# Patient Record
Sex: Male | Born: 1945 | Race: White | Hispanic: No | Marital: Married | Smoking: Never smoker
Health system: Southern US, Community
[De-identification: ages and names within clinical notes are randomized; demographics above are authoritative.]

## PROBLEM LIST (undated history)

## (undated) DIAGNOSIS — G20A1 Parkinson's disease without dyskinesia, without mention of fluctuations: Secondary | ICD-10-CM

## (undated) DIAGNOSIS — N401 Enlarged prostate with lower urinary tract symptoms: Secondary | ICD-10-CM

## (undated) DIAGNOSIS — N138 Other obstructive and reflux uropathy: Secondary | ICD-10-CM

## (undated) DIAGNOSIS — G4733 Obstructive sleep apnea (adult) (pediatric): Secondary | ICD-10-CM

## (undated) DIAGNOSIS — K439 Ventral hernia without obstruction or gangrene: Secondary | ICD-10-CM

## (undated) DIAGNOSIS — I10 Essential (primary) hypertension: Secondary | ICD-10-CM

## (undated) DIAGNOSIS — E782 Mixed hyperlipidemia: Secondary | ICD-10-CM

## (undated) DIAGNOSIS — G20 Parkinson's disease: Secondary | ICD-10-CM

## (undated) DIAGNOSIS — J300 Vasomotor rhinitis: Secondary | ICD-10-CM

## (undated) DIAGNOSIS — F329 Major depressive disorder, single episode, unspecified: Secondary | ICD-10-CM

## (undated) DIAGNOSIS — G4731 Primary central sleep apnea: Secondary | ICD-10-CM

## (undated) DIAGNOSIS — I2510 Atherosclerotic heart disease of native coronary artery without angina pectoris: Secondary | ICD-10-CM

## (undated) DIAGNOSIS — E7800 Pure hypercholesterolemia, unspecified: Secondary | ICD-10-CM

## (undated) DIAGNOSIS — I219 Acute myocardial infarction, unspecified: Secondary | ICD-10-CM

## (undated) DIAGNOSIS — G4700 Insomnia, unspecified: Secondary | ICD-10-CM

## (undated) HISTORY — DX: Benign prostatic hyperplasia with lower urinary tract symptoms: N13.8

## (undated) HISTORY — DX: Atherosclerotic heart disease of native coronary artery without angina pectoris: I25.10

## (undated) HISTORY — DX: Parkinson's disease without dyskinesia, without mention of fluctuations: G20.A1

## (undated) HISTORY — PX: OTHER SURGICAL HISTORY: SHX169

## (undated) HISTORY — DX: Major depressive disorder, single episode, unspecified: F32.9

## (undated) HISTORY — DX: Acute myocardial infarction, unspecified: I21.9

## (undated) HISTORY — DX: Vasomotor rhinitis: J30.0

## (undated) HISTORY — DX: Insomnia, unspecified: G47.00

## (undated) HISTORY — DX: Parkinson's disease: G20

## (undated) HISTORY — PX: CATARACT EXTRACTION, BILATERAL: SHX1313

## (undated) HISTORY — DX: Ventral hernia without obstruction or gangrene: K43.9

## (undated) HISTORY — DX: Mixed hyperlipidemia: E78.2

## (undated) HISTORY — DX: Benign prostatic hyperplasia with lower urinary tract symptoms: N40.1

## (undated) HISTORY — PX: CARDIAC SURGERY: SHX584

## (undated) HISTORY — DX: Obstructive sleep apnea (adult) (pediatric): G47.33

## (undated) HISTORY — DX: Primary central sleep apnea: G47.31

## (undated) HISTORY — DX: Pure hypercholesterolemia, unspecified: E78.00

## (undated) HISTORY — DX: Essential (primary) hypertension: I10

---

## 2001-07-23 ENCOUNTER — Emergency Department (HOSPITAL_COMMUNITY): Admission: EM | Admit: 2001-07-23 | Discharge: 2001-07-23 | Payer: Self-pay | Admitting: Emergency Medicine

## 2002-03-20 ENCOUNTER — Encounter: Admission: RE | Admit: 2002-03-20 | Discharge: 2002-03-20 | Payer: Self-pay | Admitting: Orthopedic Surgery

## 2002-03-20 ENCOUNTER — Encounter: Payer: Self-pay | Admitting: Orthopedic Surgery

## 2002-04-17 ENCOUNTER — Emergency Department (HOSPITAL_COMMUNITY): Admission: EM | Admit: 2002-04-17 | Discharge: 2002-04-17 | Payer: Self-pay | Admitting: Emergency Medicine

## 2002-04-17 ENCOUNTER — Encounter: Payer: Self-pay | Admitting: Emergency Medicine

## 2002-04-24 ENCOUNTER — Encounter: Payer: Self-pay | Admitting: *Deleted

## 2002-04-24 ENCOUNTER — Encounter: Admission: RE | Admit: 2002-04-24 | Discharge: 2002-04-24 | Payer: Self-pay | Admitting: *Deleted

## 2002-04-26 ENCOUNTER — Ambulatory Visit (HOSPITAL_BASED_OUTPATIENT_CLINIC_OR_DEPARTMENT_OTHER): Admission: RE | Admit: 2002-04-26 | Discharge: 2002-04-26 | Payer: Self-pay | Admitting: *Deleted

## 2002-04-26 ENCOUNTER — Encounter: Payer: Self-pay | Admitting: *Deleted

## 2002-04-30 ENCOUNTER — Ambulatory Visit (HOSPITAL_COMMUNITY): Admission: RE | Admit: 2002-04-30 | Discharge: 2002-04-30 | Payer: Self-pay | Admitting: Urology

## 2002-05-02 ENCOUNTER — Observation Stay (HOSPITAL_COMMUNITY): Admission: EM | Admit: 2002-05-02 | Discharge: 2002-05-03 | Payer: Self-pay | Admitting: Urology

## 2002-05-07 ENCOUNTER — Encounter: Payer: Self-pay | Admitting: Emergency Medicine

## 2002-05-07 ENCOUNTER — Emergency Department (HOSPITAL_COMMUNITY): Admission: EM | Admit: 2002-05-07 | Discharge: 2002-05-07 | Payer: Self-pay | Admitting: Emergency Medicine

## 2002-05-09 ENCOUNTER — Ambulatory Visit (HOSPITAL_BASED_OUTPATIENT_CLINIC_OR_DEPARTMENT_OTHER): Admission: RE | Admit: 2002-05-09 | Discharge: 2002-05-09 | Payer: Self-pay | Admitting: Urology

## 2002-07-09 ENCOUNTER — Ambulatory Visit (HOSPITAL_BASED_OUTPATIENT_CLINIC_OR_DEPARTMENT_OTHER): Admission: RE | Admit: 2002-07-09 | Discharge: 2002-07-09 | Payer: Self-pay | Admitting: Urology

## 2003-02-02 HISTORY — PX: CORONARY ARTERY BYPASS GRAFT: SHX141

## 2003-02-11 ENCOUNTER — Inpatient Hospital Stay (HOSPITAL_COMMUNITY): Admission: EM | Admit: 2003-02-11 | Discharge: 2003-02-20 | Payer: Self-pay | Admitting: Emergency Medicine

## 2003-03-15 ENCOUNTER — Encounter (HOSPITAL_COMMUNITY): Admission: RE | Admit: 2003-03-15 | Discharge: 2003-06-13 | Payer: Self-pay | Admitting: Cardiology

## 2003-05-13 ENCOUNTER — Encounter: Admission: RE | Admit: 2003-05-13 | Discharge: 2003-08-11 | Payer: Self-pay | Admitting: Internal Medicine

## 2003-06-14 ENCOUNTER — Encounter (HOSPITAL_COMMUNITY): Admission: RE | Admit: 2003-06-14 | Discharge: 2003-07-22 | Payer: Self-pay | Admitting: Cardiology

## 2003-07-26 ENCOUNTER — Encounter: Admission: RE | Admit: 2003-07-26 | Discharge: 2003-07-26 | Payer: Self-pay | Admitting: Cardiothoracic Surgery

## 2003-09-24 ENCOUNTER — Encounter: Admission: RE | Admit: 2003-09-24 | Discharge: 2003-10-10 | Payer: Self-pay | Admitting: Internal Medicine

## 2003-10-10 ENCOUNTER — Emergency Department (HOSPITAL_COMMUNITY): Admission: EM | Admit: 2003-10-10 | Discharge: 2003-10-11 | Payer: Self-pay | Admitting: Emergency Medicine

## 2003-12-06 ENCOUNTER — Ambulatory Visit: Payer: Self-pay | Admitting: Cardiology

## 2004-01-01 ENCOUNTER — Ambulatory Visit: Payer: Self-pay | Admitting: Internal Medicine

## 2004-01-09 ENCOUNTER — Encounter
Admission: RE | Admit: 2004-01-09 | Discharge: 2004-04-08 | Payer: Self-pay | Admitting: Physical Medicine & Rehabilitation

## 2004-01-10 ENCOUNTER — Ambulatory Visit: Payer: Self-pay | Admitting: Physical Medicine & Rehabilitation

## 2004-01-14 ENCOUNTER — Encounter
Admission: RE | Admit: 2004-01-14 | Discharge: 2004-04-09 | Payer: Self-pay | Admitting: Physical Medicine & Rehabilitation

## 2004-04-10 ENCOUNTER — Encounter
Admission: RE | Admit: 2004-04-10 | Discharge: 2004-07-09 | Payer: Self-pay | Admitting: Physical Medicine & Rehabilitation

## 2004-05-04 ENCOUNTER — Ambulatory Visit: Payer: Self-pay | Admitting: Physical Medicine & Rehabilitation

## 2004-12-18 ENCOUNTER — Encounter
Admission: RE | Admit: 2004-12-18 | Discharge: 2005-03-18 | Payer: Self-pay | Admitting: Physical Medicine & Rehabilitation

## 2004-12-18 ENCOUNTER — Ambulatory Visit: Payer: Self-pay | Admitting: Physical Medicine & Rehabilitation

## 2004-12-22 ENCOUNTER — Encounter
Admission: RE | Admit: 2004-12-22 | Discharge: 2005-01-27 | Payer: Self-pay | Admitting: Physical Medicine & Rehabilitation

## 2005-03-02 ENCOUNTER — Encounter: Admission: RE | Admit: 2005-03-02 | Discharge: 2005-03-02 | Payer: Self-pay | Admitting: Cardiovascular Disease

## 2005-07-01 ENCOUNTER — Emergency Department (HOSPITAL_COMMUNITY): Admission: EM | Admit: 2005-07-01 | Discharge: 2005-07-01 | Payer: Self-pay | Admitting: Emergency Medicine

## 2007-02-28 ENCOUNTER — Emergency Department (HOSPITAL_COMMUNITY): Admission: EM | Admit: 2007-02-28 | Discharge: 2007-02-28 | Payer: Self-pay | Admitting: *Deleted

## 2007-09-23 ENCOUNTER — Observation Stay (HOSPITAL_COMMUNITY): Admission: AD | Admit: 2007-09-23 | Discharge: 2007-09-25 | Payer: Self-pay | Admitting: Cardiology

## 2007-09-23 ENCOUNTER — Encounter: Payer: Self-pay | Admitting: Emergency Medicine

## 2007-10-26 ENCOUNTER — Encounter: Admission: RE | Admit: 2007-10-26 | Discharge: 2007-10-26 | Payer: Self-pay | Admitting: Gastroenterology

## 2007-11-08 ENCOUNTER — Encounter: Admission: RE | Admit: 2007-11-08 | Discharge: 2007-11-08 | Payer: Self-pay | Admitting: Gastroenterology

## 2008-10-22 ENCOUNTER — Observation Stay (HOSPITAL_COMMUNITY): Admission: EM | Admit: 2008-10-22 | Discharge: 2008-10-23 | Payer: Self-pay | Admitting: Emergency Medicine

## 2008-12-23 ENCOUNTER — Emergency Department (HOSPITAL_BASED_OUTPATIENT_CLINIC_OR_DEPARTMENT_OTHER): Admission: EM | Admit: 2008-12-23 | Discharge: 2008-12-23 | Payer: Self-pay | Admitting: Emergency Medicine

## 2008-12-23 ENCOUNTER — Ambulatory Visit: Payer: Self-pay | Admitting: Diagnostic Radiology

## 2009-05-26 ENCOUNTER — Encounter: Admission: RE | Admit: 2009-05-26 | Discharge: 2009-05-26 | Payer: Self-pay | Admitting: Family Medicine

## 2009-06-03 ENCOUNTER — Emergency Department (HOSPITAL_COMMUNITY): Admission: EM | Admit: 2009-06-03 | Discharge: 2009-06-03 | Payer: Self-pay | Admitting: Emergency Medicine

## 2010-04-21 LAB — CBC
HCT: 37.9 % — ABNORMAL LOW (ref 39.0–52.0)
Hemoglobin: 13.2 g/dL (ref 13.0–17.0)
MCHC: 34.9 g/dL (ref 30.0–36.0)
MCV: 92.2 fL (ref 78.0–100.0)
Platelets: 229 10*3/uL (ref 150–400)
RBC: 4.12 MIL/uL — ABNORMAL LOW (ref 4.22–5.81)
RDW: 12.6 % (ref 11.5–15.5)
WBC: 6.8 10*3/uL (ref 4.0–10.5)

## 2010-04-21 LAB — POCT CARDIAC MARKERS
CKMB, poc: <1 ng/mL — ABNORMAL LOW (ref 1.0–8.0)
CKMB, poc: <1 ng/mL — ABNORMAL LOW (ref 1.0–8.0)
Myoglobin, poc: 62.2 ng/mL (ref 12–200)
Myoglobin, poc: 71.6 ng/mL (ref 12–200)
Troponin i, poc: <0.05 ng/mL (ref 0.00–0.09)
Troponin i, poc: <0.05 ng/mL (ref 0.00–0.09)

## 2010-04-21 LAB — D-DIMER, QUANTITATIVE: D-Dimer, Quant: 0.28 ug/mL-FEU (ref 0.00–0.48)

## 2010-04-21 LAB — BASIC METABOLIC PANEL
BUN: 20 mg/dL (ref 6–23)
CO2: 29 mEq/L (ref 19–32)
Calcium: 9 mg/dL (ref 8.4–10.5)
Chloride: 105 mEq/L (ref 96–112)
Creatinine, Ser: 0.92 mg/dL (ref 0.4–1.5)
GFR calc Af Amer: >60 mL/min (ref >60)
GFR calc non Af Amer: >60 mL/min (ref >60)
Glucose, Bld: 84 mg/dL (ref 70–99)
Potassium: 3.6 mEq/L (ref 3.5–5.1)
Sodium: 140 mEq/L (ref 135–145)

## 2010-04-21 LAB — DIFFERENTIAL
Basophils Absolute: 0 10*3/uL (ref 0.0–0.1)
Basophils Relative: 0 % (ref 0–1)
Eosinophils Absolute: 0.1 10*3/uL (ref 0.0–0.7)
Eosinophils Relative: 2 % (ref 0–5)
Lymphocytes Relative: 12 % (ref 12–46)
Lymphs Abs: 0.8 10*3/uL (ref 0.7–4.0)
Monocytes Absolute: 0.4 10*3/uL (ref 0.1–1.0)
Monocytes Relative: 5 % (ref 3–12)
Neutro Abs: 5.5 10*3/uL (ref 1.7–7.7)
Neutrophils Relative %: 81 % — ABNORMAL HIGH (ref 43–77)

## 2010-04-21 LAB — BRAIN NATRIURETIC PEPTIDE: Pro B Natriuretic peptide (BNP): 79 pg/mL (ref 0.0–100.0)

## 2010-05-06 LAB — BASIC METABOLIC PANEL
BUN: 30 mg/dL — ABNORMAL HIGH (ref 6–23)
CO2: 29 mEq/L (ref 19–32)
Calcium: 8.9 mg/dL (ref 8.4–10.5)
Chloride: 107 mEq/L (ref 96–112)
Creatinine, Ser: 1 mg/dL (ref 0.4–1.5)
GFR calc Af Amer: >60 mL/min (ref >60)
GFR calc non Af Amer: >60 mL/min (ref >60)
Glucose, Bld: 105 mg/dL — ABNORMAL HIGH (ref 70–99)
Potassium: 3.8 mEq/L (ref 3.5–5.1)
Sodium: 142 mEq/L (ref 135–145)

## 2010-05-06 LAB — URINALYSIS, ROUTINE W REFLEX MICROSCOPIC
Glucose, UA: NEGATIVE mg/dL
Ketones, ur: NEGATIVE mg/dL
pH: 6 (ref 5.0–8.0)

## 2010-05-06 LAB — CBC
HCT: 38.6 % — ABNORMAL LOW (ref 39.0–52.0)
Hemoglobin: 13.3 g/dL (ref 13.0–17.0)
MCHC: 34.5 g/dL (ref 30.0–36.0)
MCV: 93.2 fL (ref 78.0–100.0)
Platelets: 197 10*3/uL (ref 150–400)
RBC: 4.15 MIL/uL — ABNORMAL LOW (ref 4.22–5.81)
RDW: 12.5 % (ref 11.5–15.5)
WBC: 3.5 10*3/uL — ABNORMAL LOW (ref 4.0–10.5)

## 2010-05-06 LAB — DIFFERENTIAL
Basophils Absolute: 0 10*3/uL (ref 0.0–0.1)
Basophils Relative: 1 % (ref 0–1)
Eosinophils Absolute: 0.3 10*3/uL (ref 0.0–0.7)
Eosinophils Relative: 8 % — ABNORMAL HIGH (ref 0–5)
Lymphocytes Relative: 33 % (ref 12–46)
Lymphs Abs: 1.1 10*3/uL (ref 0.7–4.0)
Monocytes Absolute: 0.4 10*3/uL (ref 0.1–1.0)
Monocytes Relative: 10 % (ref 3–12)
Neutro Abs: 1.7 10*3/uL (ref 1.7–7.7)
Neutrophils Relative %: 48 % (ref 43–77)

## 2010-05-06 LAB — POCT CARDIAC MARKERS
CKMB, poc: <1 ng/mL — ABNORMAL LOW (ref 1.0–8.0)
CKMB, poc: <1 ng/mL — ABNORMAL LOW (ref 1.0–8.0)
Troponin i, poc: <0.05 ng/mL (ref 0.00–0.09)

## 2010-05-06 LAB — RAPID STREP SCREEN (MED CTR MEBANE ONLY): Streptococcus, Group A Screen (Direct): NEGATIVE

## 2010-05-08 LAB — POCT CARDIAC MARKERS
CKMB, poc: <1 ng/mL — ABNORMAL LOW (ref 1.0–8.0)
Myoglobin, poc: 43.7 ng/mL (ref 12–200)

## 2010-05-08 LAB — DIFFERENTIAL
Eosinophils Absolute: 0.1 10*3/uL (ref 0.0–0.7)
Eosinophils Relative: 3 % (ref 0–5)
Lymphocytes Relative: 28 % (ref 12–46)
Lymphs Abs: 0.9 10*3/uL (ref 0.7–4.0)
Lymphs Abs: 1.1 10*3/uL (ref 0.7–4.0)
Monocytes Absolute: 0.4 10*3/uL (ref 0.1–1.0)
Monocytes Absolute: 0.5 10*3/uL (ref 0.1–1.0)
Monocytes Relative: 12 % (ref 3–12)
Monocytes Relative: 8 % (ref 3–12)
Neutro Abs: 2.2 10*3/uL (ref 1.7–7.7)
Neutrophils Relative %: 71 % (ref 43–77)

## 2010-05-08 LAB — COMPREHENSIVE METABOLIC PANEL
AST: 15 U/L (ref 0–37)
BUN: 21 mg/dL (ref 6–23)
Calcium: 9.3 mg/dL (ref 8.4–10.5)
Creatinine, Ser: 0.91 mg/dL (ref 0.4–1.5)
GFR calc non Af Amer: >60 mL/min (ref >60)
Potassium: 3.8 mEq/L (ref 3.5–5.1)
Sodium: 142 mEq/L (ref 135–145)
Total Bilirubin: 0.7 mg/dL (ref 0.3–1.2)

## 2010-05-08 LAB — LIPID PANEL
HDL: 35 mg/dL — ABNORMAL LOW (ref >39)
Total CHOL/HDL Ratio: 2.9 RATIO
Triglycerides: 26 mg/dL (ref <150)
VLDL: 5 mg/dL (ref 0–40)

## 2010-05-08 LAB — CBC
HCT: 38.2 % — ABNORMAL LOW (ref 39.0–52.0)
HCT: 41.6 % (ref 39.0–52.0)
Hemoglobin: 12.8 g/dL — ABNORMAL LOW (ref 13.0–17.0)
MCHC: 33.9 g/dL (ref 30.0–36.0)
MCV: 92.1 fL (ref 78.0–100.0)
Platelets: 214 10*3/uL (ref 150–400)
Platelets: 214 10*3/uL (ref 150–400)
RBC: 4.08 MIL/uL — ABNORMAL LOW (ref 4.22–5.81)
RDW: 13 % (ref 11.5–15.5)
WBC: 4.9 10*3/uL (ref 4.0–10.5)

## 2010-05-08 LAB — LIPASE, BLOOD: Lipase: 25 U/L (ref 11–59)

## 2010-05-08 LAB — PROTIME-INR
INR: 0.9 (ref 0.00–1.49)
Prothrombin Time: 12.5 seconds (ref 11.6–15.2)

## 2010-05-08 LAB — CARDIAC PANEL(CRET KIN+CKTOT+MB+TROPI)
CK, MB: 0.5 ng/mL (ref 0.3–4.0)
Total CK: 76 U/L (ref 7–232)
Troponin I: 0.01 ng/mL (ref 0.00–0.06)

## 2010-05-08 LAB — HEMOGLOBIN A1C
Hgb A1c MFr Bld: 5.5 % (ref 4.6–6.1)
Mean Plasma Glucose: 111 mg/dL

## 2010-05-08 LAB — APTT: aPTT: 29 seconds (ref 24–37)

## 2010-05-08 LAB — CK TOTAL AND CKMB (NOT AT ARMC): Relative Index: INVALID (ref 0.0–2.5)

## 2010-05-08 LAB — TSH: TSH: 0.487 u[IU]/mL (ref 0.350–4.500)

## 2010-06-16 NOTE — Discharge Summary (Signed)
NAMEJAHMARI, Paul Ortiz              ACCOUNT NO.:  [id number]   MEDICAL RECORD NO.:  [id number]          PATIENT TYPE:  INP   LOCATION:  3740                         FACILITY:  [facility]   PHYSICIAN:  Antonieta Iba, MD   DATE OF BIRTH:  06/16/1945   DATE OF ADMISSION:  09/23/2007  DATE OF DISCHARGE:  09/25/2007                               DISCHARGE SUMMARY   Paul Ortiz is a 65 year old white married male patient with known  coronary artery disease.  He had an MI, a PCI, and then a CABG in 2005,  also has dyslipidemia.  He came to the emergency room because of sudden  onset of abdominal epigastric tightness and mild shortness of breath.  It occurred after he got upset with some visitors at his home.  He also  had palpitations.  Thus, he was seen in the emergency room and admitted  to rule out an MI.  To note, he recently had a stress Myoview in June  2009 that was negative for ischemia.  His medications were continued.  His Altace was thought to be 5 mg.  However, at the time of discharge it  was found he was only on 2.5 mg a day.  He only received one dose of  Altace at 5 mg and it was decided to give him back his home dose of  medication which is 2.5 because his blood pressure have been running  below 100 in the 96 range.  His CK-MBs and troponins came back negative.  He was covered with Lovenox.  He was seen by Dr. Julien Nordmann on  September 25, 2007, considered stable for discharge home.  He will follow  up with Dr. Tresa Endo in 1-2 weeks.   DISCHARGE DIAGNOSES:  1. Chest pain, tachy palpitations after some emotional stress.  CK-MB      and troponins came back negative.  He has had a recent stress      Myoview on June 2009 which was negative for ischemia.  Chest pain      thought not to be ischemic related.  2. History of coronary artery disease with a coronary artery bypass      graft in 2005 with a left internal mammary artery to his left      anterior descending, radial artery  graft to his circumflex, and      saphenous vein graft to his posterior descending artery.  3. Dyslipidemia.  4. Lactose intolerance.  5. Gastroesophageal reflux disease.   LABORATORY DATA:  CK-MBs are negative x3.  Sodium 143, potassium 4.0,  chloride 105, CO2 30, BUN 18, and creatinine 1.06.  Hemoglobin 13.7,  hematocrit 40.8, WBCs 4.5, and platelets 197,000.  TSH is 1.146 and  magnesium is 2.3.   DISCHARGE MEDICATIONS:  Altace 2.5 mg a day, Nexium 40 mg b.i.d., Toprol-  XL 25 mg at bedtime, Plavix 75 mg at bedtime, Crestor same dose as he  was at home at bedtime, aspirin 81 mg every day, niacin 2000 mg at  bedtime, Xanax XR 1 mg at bedtime, and fish oil 5000 mg a day.   He  will follow up Dr. Nicki Guadalajara on October 05, 2007, at 12:15.   He has no activity restrictions.      Lezlie Octave, N.P.      Antonieta Iba, MD  Electronically Signed    BB/MEDQ  D:  09/25/2007  T:  09/26/2007  Job:  [id number]   cc:   Dr. Tiburcio Pea

## 2010-06-19 NOTE — Op Note (Signed)
NAME:  Paul Ortiz, Paul Ortiz                        ACCOUNT NO.:  [id number]   MEDICAL RECORD NO.:  [id number]                   PATIENT TYPE:  AMB   LOCATION:  NESC                                 FACILITY:  [facility]   PHYSICIAN:  Bertram Millard. Dahlstedt, M.D.          DATE OF BIRTH:  06-29-1945   DATE OF PROCEDURE:  05/09/2002  DATE OF DISCHARGE:                                 OPERATIVE REPORT   PREOPERATIVE DIAGNOSES:  1. Left hydronephrosis.  2. Left renal calculus.   POSTOPERATIVE DIAGNOSES:  1. Left hydronephrosis.  2. Left renal calculus.  3. Small perforation in ureter.   PRINCIPAL PROCEDURE:  1. Cystoscopy.  2. Left retrograde ureteropyelogram.  3. Double-J stent placement (26 cm x 6 [place]).   ANESTHESIA:  General with LMA.   COMPLICATIONS:  None.   BRIEF HISTORY:  A 65 year old male with significant problems over the past 2-  3 weeks with left ureteral and now left renal calculus.  He underwent  lithotripsy of an 8 mm left upper ureteral calculus with insignificant  fragmentation.  The patient was then sent to me by [name], M.D. for  definitive treatment.  I performed a left ureteroscopy last week.  I was  unable to encounter the stone, despite going up into the kidney and  inspecting each calix.  It may be that the small stone was resting in a  calix that I could not get to with the flexible scope.  A stent was placed  although on follow-up in the office, there was an unusual curl to the stent.  I then pulled the stent out last week.  The patient initially had resolution  of his pain.  However, he has had persistent pain and some psoas signs since  that time.  I saw him yesterday and recommended that we place another stent,  as he may have persistent hydronephrosis.  He does have a history of a left  distal ureteral stricture which I dilated last week.   He presents at this time for the procedure of placing a stent.  He is aware  of risks and complications and  desires to proceed.   DESCRIPTION OF PROCEDURE:  The patient was administered a general  anesthetic.  He was also given preoperative IV antibiotics.  He was placed  in the dorsal lithotomy position.  Genitalia and perineum were prepped and  draped.  Cystoscopy was performed.  Bladder was normal except for mild  erythema around the left ureteral orifice.  I did not see duplicated  orifices on either side.  A retrograde was then performed.  This showed a  small stricture in the distal ureter, but this easily allowed contrast to  get past.  There was a small obvious perforation in the proximal ureter.  There was mild left hydronephrosis.  I then advanced a guidewire through the  end-hole catheter and up into the left renal pelvis and upper pole.  I then  placed a 26 cm, 6 French double-J stent over the guidewire.  Good curls were  seen proximally and distally fluoroscopically and cystoscopically after  placement.  The bladder was drained.  The procedure was then terminated.  The patient tolerated the procedure well.  He was returned to the PACU in  stable condition after extubation.   I will follow the patient up in approximately one week.  He will be covered  with p.o. antibiotics.  He was given pain medicines yesterday.  He was also  given a prescription for Oxybutynin p.r.n. frequent urination.                                               Bertram Millard. Dahlstedt, M.D.    SMD/MEDQ  D:  05/09/2002  T:  05/09/2002  Job:  [id number]

## 2010-06-19 NOTE — Assessment & Plan Note (Signed)
DATE OF VISIT:  May 04, 2004   REASON FOR VISIT:  Mr. Olazabal returns today with his pain averaging about  3/10.  He sleeps okay.  His relief from medication is about 80%.  He is  using a 1/4 Lidoderm patch over a very specific small area at the mid  sternal incision area on the left side.  He has completed his physical  therapy.  He states that this has generally helped him.  This has been upper  back strengthening with stretching of the pectoralis musculature.  They did  get rid of some of his trigger points in that area.  He is not taking any  oral pain medications for his chest pain and is just using Lidoderm patches.  He was wondering whether he could wear it a little bit longer than 12 hours.   SOCIAL HISTORY:  Married and works 40 hours a week.   PHYSICAL EXAMINATION:  VITAL SIGNS:  Blood pressure 125/64, pulse 75,  respirations 16, O2 saturations 99% on room air.  GENERAL:  In no acute distress.  Mood and affect appropriate.  Gait is  normal.  He has mild tenderness to palpation with right mid sternal incision  approximately 2 cm lateral to midline.  He has no tenderness around the  ribs.  He has no tenderness around the thoracic paraspinals.  He has good  spine range of motion.   IMPRESSION:  Post sternotomy pain.  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  This has responded nicely to the Lidoderm.  Part of it is just  lack of contact with clothing which is what really irritates that area, but  I think the other is the local anesthetic of the Lidoderm on superficial  nerves.   PLAN:  1.  Will continue the Lidoderm.  He can wear it up to 18 hours given that it      is only a 1/4 patch.  2.  Continue physical therapy exercises.  3.  I do not think I need to see him back.  His primary physician should be      okay with prescribing the Lidoderm.  4.  Will write him another prescription and see him back p.r.n.      AEK/MedQ  D:  05/04/2004 12:50:50  T:  05/04/2004  14:40:55  Job #:  [id number]   cc:   Sharlet Salina, M.D.  [address]  [place]  [place] [address]  Fax: [phone]   Mikey Bussing, M.D.  [address]  [place]  [place] [address]

## 2010-06-19 NOTE — Op Note (Signed)
NAME:  Paul Ortiz, Paul Ortiz                        ACCOUNT NO.:  [id number]   MEDICAL RECORD NO.:  [id number]                   PATIENT TYPE:  AMB   LOCATION:  NESC                                 FACILITY:  [facility]   PHYSICIAN:  Bertram Millard. Dahlstedt, M.D.          DATE OF BIRTH:  01/18/1946   DATE OF PROCEDURE:  07/09/2002  DATE OF DISCHARGE:                                 OPERATIVE REPORT   PREOPERATIVE DIAGNOSIS:  A 5 mm left lower calyceal stone.   POSTOPERATIVE DIAGNOSIS:  A 5 mm left lower calyceal stone.   PRINCIPAL PROCEDURE:  Left ureteroscopy with holmium laser lithotripsy of  left renal stone, double J stent placement, interpretive fluoroscopy.   SURGEON:  Bertram Millard. Dahlstedt, M.D.   ANESTHESIA:  General with LMA.   COMPLICATIONS:  None.   BRIEF HISTORY:  A 65 year old male with a difficult-to-treat left renal  stone.  He has been through previous ureteroscopy.  He had a ureteral  stricture, which made it difficult to get to the stone before.  He has had  an indwelling stent now for several weeks and returns at this point for  definitive ureteroscopy, laser lithotripsy of stone.  He is aware of risks  and complications and desires to proceed.   DESCRIPTION OF PROCEDURE:  The patient was administered IV gentamicin prior  to the procedure and taken to the operating room, where a general anesthetic  was administered using the LMA.  He was placed in the dorsal lithotomy  position.  Genitalia and perineum were prepped and draped.  A 22 French  panendoscope was passed into his bladder, which was normal.  The previously-  mentioned ureteral stent was removed.  I then placed a ureteral access  sheath and through the access sheath passed the 7.5 French flexible  ureteroscope.  I was easily able to guide this ureteroscope into the lower  pole of the kidney.  The stone was encountered.  The 200 micron laser fiber  was then passed through the scope.  Once the stone was  encountered, it was  fragmented into quite a few small fragments, which were all felt to be small  enough to pass quite easily.  No other stones were seen in the kidney.  At  this point the scope was removed.  Over top of the guidewire, a 24 cm x 6  [place] double J stent was placed.  A string was left on the distal end.  The  bladder was drained and the scope removed.   The patient tolerated the procedure well.  He was awakened and taken to the  PACU in stable condition.                                                Bertram Millard. Dahlstedt, M.D.  SMD/MEDQ  D:  07/09/2002  T:  07/09/2002  Job:  [id number]

## 2010-06-19 NOTE — Op Note (Signed)
NAME:  Paul Ortiz, Paul Ortiz                        ACCOUNT NO.:  [id number]   MEDICAL RECORD NO.:  [id number]                   PATIENT TYPE:  AMB   LOCATION:  DAY                                  FACILITY:  [facility]   PHYSICIAN:  Bertram Millard. Dahlstedt, M.D.          DATE OF BIRTH:  [birth year]   DATE OF PROCEDURE:  DATE OF DISCHARGE:                                 OPERATIVE REPORT   PREOPERATIVE DIAGNOSIS:  Left ureteral calculus with hydronephrosis.   POSTOPERATIVE DIAGNOSIS:  No ureteral calculus seen, left ureteral  stricture.   PRINCIPAL PROCEDURE:  1. Cystoscopy.  2. Left retrograde.  3. Ureteropyelogram.  4. Left ureteroscopy (rigid and flexible).  5. Dilation of left ureteral stricture.  6. Double-J stent placement.  7. Interpretive fluoroscopy.   SURGEON:  Bertram Millard. Dahlstedt, M.D.   ANESTHESIA:  General endotracheal.   COMPLICATIONS:  None.   BRIEF HISTORY:  Paul Ortiz, who presented recently to Paul Ortiz, M.D. with Paul left upper ureteral stone and hydronephrosis.  He has  had significant pain.  Lithotripsy was performed, and follow-up revealed no  fragmentation or movement of the stone.  The patient has had persistent pain  and desires further management.  The patient was given treatment options and  at this point, it is recommended he undergo left ureteroscopy with direct  holmium laser lithotripsy of the stone.  He is aware of the risks and  complications involved and desires to proceed.   DESCRIPTION OF PROCEDURE:  IV gentamycin and vancomycin were administered  preoperatively.  The patient was taken to the operating room where general  endotracheal anesthetic was administered.  He was placed in the dorsal  lithotomy position.  Genitalia and perineum were prepped and draped.  Paul  cystoscope was placed into his bladder.  Urethral and prostatic urethra were  unremarkable. Bladder was unremarkable.  Single ureteral orifices were seen  on either side.   The left ureteral orifice was cannulated, and Paul retrograde  pyelogram was performed.  This showed no evident hydronephrosis.  I really  did not see any filling defects in the left ureter.  Paul guidewire was placed  in the left renal pelvis, and ureteroscope was passed over top of the  guidewire.  I was unable to get past the distal ureteral stricture.  I then  used Paul 10 cm 15 French balloon to dilate.  Despite 20 atmospheres of  pressure, there was still Paul waist at this area.  The balloon was removed,  and I was then able to traverse this small stricture.  I did not see any  duplicated ureters.  The ureter was traversed all the way to the left renal  pelvis.  I did not see any stones.  I did not see any hydronephrosis.  Once  I got the scope up into the left renal pelvis, it was evident that I would  have to use Paul flexible ureteroscope  to get into the calices to try to  identify any stone.  I then used Paul 55 cm ureteral access sheath over top of  the guidewire to then allow access with the 6 French flexible ureteroscope  into the left renal pelvis.  I then identified all calices.  I was unable to  see any stone in any of his calices.  I did place contrast through the  ureteroscope to better identify the calices fluoroscopically.  Each calix  was entered.  Again, I did not see any stones.  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  I did not see any additional ureteral  orifices either at the bladder neck or on the trigone.  Careful inspection  was performed once the ureteroscope was removed.  On removal of the  ureteroscope, again I did not see any stones, and I did not see anything  that looked Paul Y segment of ureter.  I then placed double-J stent over Paul  guidewire.  There was an unusual coil of the stent in the proximal ureter  and renal pelvis.  I was unable to get this straightened out despite  multiple attempts at passing the scope.  I then removed the guidewire.  At   this point, the bladder was drained.  The scope was removed and the  procedure terminated.  The patient was administered Paul B&O suppository.  He  was awakened and taken to PACU in stable condition.                                               Bertram Millard. Dahlstedt, M.D.    SMD/MEDQ  D:  04/30/2002  T:  04/30/2002  Job:  [id number]

## 2010-06-19 NOTE — H&P (Signed)
NAME:  Paul Ortiz, Paul Ortiz                        ACCOUNT NO.:  [id number]   MEDICAL RECORD NO.:  [id number]                   PATIENT TYPE:  AMB   LOCATION:  NESC                                 [facility]ACILITY[facility]   PHYSICIAN:  Ethelene Browns., M.D.            DATE OF BIRTH:  [birth year]   DATE OF ADMISSION:  DATE OF DISCHARGE:                                HISTORY & PHYSICAL   CHIEF COMPLAINT:  This patient, age 65, presented on 24 April 2002 with a  chief complaint of pain in his left side, onset 17 April 2002.  He was seen  in the emergency room at [facility].  A CT scan revealed an 8 mm calculus  in the lower calix of the left kidney and a 1 mm calculus in the distal  right ureter which he subsequently passed on May 16, 2002.   HISTORY OF PRESENT ILLNESS:  The patient's first stone was roughly in 1975.  Prior to his present episode, his last stone was 1987.  He did not recover  any of these stones.  The patient had pain in the left side on 17 April 2002, went to [facility] Emergency Room where the CT scan revealed a 1 mm  stone in the distal left ureter and an 8 mm stone in the left lower calix,  really not much in the way of dilation.  He also was found to have some low  density liver lesions, probably cysts, but they will be investigated by Dr.  Lovell Sheehan further.  He did reasonably well until 24 April 2002, when the pain  in the left side recurred and was quite severe.  He came to the office,  received Toradol 60 mg IM with fairly good relief of his pain.  He gets up 0-  1 times a night, voids every 4 hours during the day, had a fairly good flow,  sometimes a little slow starting.  He reports that he had a normal PSA by  Dr. Lovell Sheehan, but it has been a little over a year ago.  He has not passed  any gross blood in the urine.  He has not had any fever.  He is unable to  give [place] a urine today.  The ultrasound suggests that he has about four  ounces in his bladder.  There is a  grade 1 hydronephrosis on the left.  There is no hydronephrosis on the right.  A KUB is performed and suggests  that the 8 mm calculus has now moved into the upper ureter and is at the L2-  3 level.  The patient is recommended for shock wave lithotripsy and is  scheduled 26 April 2002 at 3 p.m., [facility] under [facility] and is  given Jacky Kindle to take for pain.   ALLERGIES:  PENICILLIN, and he does not feel well when he takes CODEINE.   MEDICATIONS:  1.  Xanax.  2. Lactaid.  3. Ginger.   PAST HISTORY:  1. Lactose intolerance.  2. Mitral prolapse.   SURGERIES:  None.   FAMILY HISTORY:  Mother died age 28 of pneumonia.  Father died of myocardial  infarction age 98.  He has one sibling with a lymphoma, a male sibling.  He has two children.  The family history is positive for diabetes in a  daughter.  No hypertension, no bleeders.  There is heart disease and cancer.   SOCIAL HISTORY:  He is married.  He does not use tobacco or alcohol.  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   REVIEW OF SYSTEMS:  GENERAL:  His weight has been steady.  HEENT:  Unremarkable.  He does have glasses.  CARDIORESPIRATORY:  Has mitral  prolapse but no chest pain or heart attack or asthma.  GI:  He has irritable  bowel syndrome, lactose intolerance, frequent BM's, but no blood in his  stools.  BONES/JOINTS/MUSCLES:  Unremarkable.  NEUROPSYCHIATRIC:  Unremarkable.  SKIN:  No lesions.  LYMPHATIC:  No nodes noted.   PHYSICAL EXAMINATION:  GENERAL:  Well-developed, well-nourished, 65 year old  male.  VITAL SIGNS:  Temperature 98, pulse 72, respirations 20, blood pressure  153/87, weight 180.  HEENT:  The ears and tympanic membranes are unremarkable.  Eyes react normal  to light and accommodation.  Extraocular movements intact.  Pharynx benign.  His teeth are good.  NECK:  No enlargement of thyroid.  No nodes palpable.  CHEST:  Clear to percussion and auscultations.  No murmur noted.   Normal  sinus rhythm.  ABDOMEN:  Flat.  Liver, kidney, spleen, hernia not detected.  There is  rather marked left flank tenderness.  GENITALIA:  He is uncircumcised.  The meatus is normal.  The testes are  normal.  There is a sensitive right spermatocele about 2 cm in diameter.  Scrotal, anus, perineum normal.  Rectal tone good.  Prostate soft,  symmetrical, smooth, 20-25 grams.  EXTREMITIES:  No edema.  Good peripheral pulses.  NEUROLOGIC:  Reflexes and sensation seem normal.  SKIN:  No lesions.  LYMPHATIC:  No nodes noted.   IMPRESSION:  1. A left ureteral stone 8 mm L2-3.  2. Left hydronephrosis.  3. Benign prostatic hypertrophy.   PLAN:  1. Give him [employer] for control of pain.  2. He is to force fluids.  3. Strain urine.  4. We will schedule extracorporeal shock wave lithotripsy 1500 hours, 26 April 2002, at [facility].                                               Ethelene Browns., M.D.    HB/MEDQ  D:  04/24/2002  T:  04/24/2002  Job:  [id number]   cc:   Dr. Erroll [facility]

## 2010-06-19 NOTE — Assessment & Plan Note (Signed)
CONSULT REQUESTED BY:  Dr. Kathlee Nations Trigt at [address] [place],  [place] [address].   This is a 65 year old male who saw me for initial consultation on January 10, 2004.  He has chest wall pain related to sternal incision scar  hypersensitivity as well as myofascial pain in the left pectoralis.  He went  to physical therapy, they did some deep myofascial massage in the pectoralis  and while this was initially painful, he is now pain free in the pectoralis  area and is able to take a deep breath.  The therapist has also strengthened  upper back musculature, looked at his posture and tried to get him sitting  up straighter at the computer.  He states that this has improved.  The  Lidoderm patch has helped.  He was wearing it at night because he told me  that he had problems with bed linens on the left side of his chest wall, but  he is sleeping well with it now.  He states his problem is more during the  daytime and sometimes when he has pressure over the left side of the sternal  border, he has some radiating pain towards his left axilla.   He has had no new medical problems in the interval time.  He does notice  some soreness around the left side of the xyphoid process.   SOCIAL HISTORY:  Married; works full time.   PAIN SCORE:  A 4/10 currently; was 5/10 last time.   PHYSICAL EXAMINATION:  VITAL SIGNS:  Blood pressure 116/57; pulse 87;  respiratory rate 18; O2 sat 98-99% room air.  CHEST:  No tenderness to palpation over the left pectoralis.  He does have  some tenderness along the left side of the sternal incision near the  midpoint or more corresponding to T4.  He also has some tenderness around  the left side of the xyphoid process.  He has no T10 rib pain.   IMPRESSION:  Chest wall pain, scar supersensitivity as well as myofascial  pain.   PLAN:  1.  We will have physical therapy work on the left rectus abdominal origin      at the xyphoid area.  I believe he does have a  trigger point there.  2.  I have told him to take the Lidoderm patch cut in half lengthwise and      place it on the left side of the sternal incision during the day; take      it off at night.  3.  No other medications.  He discussed wanting to be off other medicines,      most notably Xanax, and I concurred with him on that.  He states that he      mainly has problems sleeping at night and thinks that this medicine is      not helping him.  He is following up with his primary care physician      regarding this.  4.  I will see him back in approximately 1 month to follow-up on his therapy      progress.      AEK/MedQ  D:  02/24/2004 16:27:25  T:  02/24/2004 17:35:00  Job #:  [id number]

## 2010-06-19 NOTE — Assessment & Plan Note (Signed)
INTERVAL HISTORY:  [age]-year-old male returns to follow up on left-  sided myofascial chest pain, has a history of sternotomy, has history of  neuroma, sternal incision scar hypersensitivity as well as muscle, i.e.,  myofascial pain.  He has had improvements now that he is back in physical  therapy.  He is back using his Lidoderm on a regular basis.  He is not  taking any oral analgesics at this time.  No shortness of breath.  No GI  discomfort.   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   REVIEW OF SYSTEMS:  Positive for anxiety and dizziness.   EXAMINATION:  VITAL SIGNS:  Blood pressure 106/57, pulse 68, respiratory  rate 16, O2 saturation 99% room air.  GENERAL:  No acute distress.  Mood and affect appropriate.  Orientation x3.  He has tenderness at the infraclavicular area over the upper aspect of the  left pectoralis muscle overlying rib.  He has intercostal tenderness at T3-4  level left parasternal.  He has full range of motion upper extremities.  His  posture is forward flexed.  He has no pain with resisted arm extension  bilaterally.  He has hypertrophic scar in lower aspect of his sternotomy  incision that is only mildly tender to palpation.   IMPRESSION:  1.  Post-sternotomy pain with scar hypersensitivity.  2.  Myofascial pain left pectoralis major and likely minor.  3.  Left costochondritis.   PLAN:  1.  Continue Lidoderm.  2.  Continue physical therapy desensitization.  3.  I will see him back in approximately 94-month followup.      Erick Colace, M.D.  Electronically Signed     AEK/MedQ  D:  01/15/2005 12:49:22  T:  01/16/2005 08:42:54  Job #:  [id number]   cc:   Kerin Perna, M.D.  [address]  [place]  [place] [address]   Sharlet Salina, M.D.  Fax: [phone]

## 2010-06-19 NOTE — Assessment & Plan Note (Signed)
MEDICAL RECORD NUMBER:  [id number]   DATE OF BIRTH:  [birth year]   Paul Ortiz returns today, has some recurrence of pain, midline lower  sternum as well as left costochondral junction and left pectoralis area.  Pain is 5/10, averaging 7/10. He was doing quite well and, in fact, I had  him come back on a p.r.n. basis. However, he stopped using his Lidoderm on a  regular basis. He is not taking any oral pain medications. He denies any  shortness of breath, sweats, or chills; no GI discomfort. He has some  depression/anxiety.   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   EXAMINATION:  Blood pressure 109/58, pulse 68, respirations 16, O2  saturation 99% in room air.   In general, no acute distress, mood and affect appropriate. He has  tenderness along the left pectoralis lateral aspect at the level of the  distal third of the clavicle infraclavicular, he has pain at the third  intercostal space left paramedian, and then he has two areas where he has  some hypertrophic scarring in the lower aspect of the incision that are also  tender to palpation.   His gait is normal. He has no respiratory distress.   IMPRESSION:  1.  Post sternotomy pain, neuroma two locations. Has been responding to      Lidoderm and needs to resume.  2.  Myofascial pain left pectoral region.  3.  Left costochondritis.   PLAN:  1.  Continue Lidoderm.  2.  Will resume physical therapy desensitization.  3.  Also discussed scar infiltration with lidocaine and pectoral trigger      point injection. He is leery of the trigger point because of risk of      pneumothorax which I described to him.   I will see him back in 1 month.      Erick Colace, M.D.  Electronically Signed     AEK/MedQ  D:  12/21/2004 13:50:07  T:  12/21/2004 16:25:32  Job #:  [id number]   cc:   Sharlet Salina, M.D.  Fax: [phone]   Kerin Perna,  M.D.  [address]  [place]  [place] [address]

## 2010-06-19 NOTE — Group Therapy Note (Signed)
REQUESTING PHYSICIAN:  Kerin Perna, M.D.   REASON FOR CONSULTATION:  Persistent left sided chest pain, status post  CABG.   HISTORY:  The patient is a 65 year old male who awoke, on February 11, 2003,  in the middle of the night sweating and dizzy.  He went to the ER, further  workup included cardiac catheterization revealing three-vessel disease, and  he underwent three-vessel coronary artery bypass grafting with internal  mammary artery saphenous as well as radial artery grafts.  This was done on  February 14, 2003.  The patient postoperatively had severe diarrhea which  prolonged hospital stay by one day.  He had followup with cardiology, Dr.  Samule Ohm, continued on his statin, continued aspirin, Plavix, and encouraged  fluid intake.  He saw Dr. Donata Clay in followup on several occasions.  On  August 23, 2003, the patient was noted to have chest wall hypersensitivity,  tried Ultram.  There was reference to, late June 2005, hypersensitivity of  the sternal incision which was felt to be fairly severe.  His Ultram trial  did not particularly help his pain.  It made him feel like a zombie.  He was  advised not to do any heavy lifting.  He was seen again, on December 10, 2003, and this time referred to the pain clinic.  He notes that his pain is  relieved by Tylenol during the day, but at night he has hypersensitivity  even with bed linens over the left side of his chest.  His pain rating goes  from 1-7 out of 10 and averages 5 out of 10.  His pain improves with rest as  well as Tylenol, made worse with working.  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   SOCIAL HISTORY:  Married.  Works full time as noted above.   REVIEW OF SYSTEMS:  He notes frequent PACs, abnormal heart rhythm, anxiety,  diarrhea, constipation, easy bleeding and bruising.   PHYSICAL EXAMINATION:  VITAL SIGNS:  Blood pressure 102/59, pulse 70,  respirations 18, O2 sat 100% on room air.  AFFECT:  Anxious but  otherwise no agitation.  GAIT:  Normal.  NECK:  No pain with range of motion but some tenderness in the lower  cervical area and of note is that he saw orthopedics today for that problem.  EXTREMITIES:  His upper extremity strength is 5/5 in the deltoid, biceps,  triceps, grip.  CHEST:  He has tenderness with even light sensation approximately 1-cm  lateral to the mid portion of his sternal incision.  There is no skin  breakdown in that area.  He has no induration.  The scar itself is mildly  hypertrophic in certain areas but not overly sensitive.  There is some  tightness in the scar and he particularly feels this when he extends his  upper back.  CARDIAC:  He has occasional ectopic beats.  No murmurs.  No rubs.  LUNGS:  Clear.  ABDOMEN:  Positive bowel sounds, soft.  MUSCULOSKELETAL:  Examination of his pectoralis muscle reveals multiple  tender areas, particularly over the ribs and at least one trigger area where  he had some radiation of pain down his arm with pressure over his pectoralis  muscle.   IMPRESSION:  1.  Chest wall pain, related to sternal incision scar hypersensitivity.  I      explained this as being an abnormal healing process of the nerves and      nerves being sensitized  to normally non-painful stimuli.  2.  Myofascial pain, left pectoralis.  I explained this as a secondary      effect, given his pain tends to have some involuntary spasms left      pectoralis.   RECOMMENDATIONS:  1.  We will apply Lidoderm patch over the super sensitive scar area.  I      think he will only need one half of a patch given the relatively small      area.  2.  We will send him for physical therapy for scar desensitization,      mobilization, upper back strengthening, pectoralis stretching, and      avoidance of pectoralis strengthening type exercises.  3.  I will see him back in about a month.  We will see how he does with his      therapy and the Lidoderm.  If he still have  suboptimal pain levels, I      would consider trigger point injection.  I cautioned him with potential      pneumothorax, although there is a slight chance he understands this is      the reason why we do not proceed with this immediately.  4.  I also advised him of using Lidoderm patch only 12 hours a day, stating      that 24 hours a day could result in measurable serum blood levels of      lidocaine and the potential arrhythmia risk.     Andr   AEK/MedQ  D:  01/10/2004 17:09:18  T:  01/10/2004 18:34:59  Job #:  [id number]   cc:   Mikey Bussing, M.D.  [address]  [place]  [place] [address]

## 2010-06-19 NOTE — H&P (Signed)
NAME:  Paul Ortiz, Paul Ortiz                        ACCOUNT NO.:  [id number]   MEDICAL RECORD NO.:  [id number]                   PATIENT TYPE:  AMB   LOCATION:  DAY                                  FACILITY:  [facility]   PHYSICIAN:  Bertram Millard. Dahlstedt, M.D.          DATE OF BIRTH:  1945/06/17   DATE OF ADMISSION:  05/02/2002  DATE OF DISCHARGE:                                HISTORY & PHYSICAL   REASON FOR ADMISSION:  Left flank pain, fever.   HISTORY OF PRESENT ILLNESS:  The patient is a 65 year old male with a left-  sided ureteral/renal calculus.  This was treated with lithotripsy first on  04/25/02, by Dr. Retia Passe.  Treatment went well, but postoperatively the  patient was found to have non-fragmentation of a stone.  With the patient's  persistent pain after his treatment, it was recommended that he undergo some  other sort of invasive procedure to eliminate the stone.  Dr. Elige Radon  referred the patient to me, and I consulted with the patient.  I recommended  a left ureteroscopy with Homian laser lithotripsy.  This was performed on  04/30/02.  It was a difficult procedure, taking a fair amount of time.  I was  unable to visualize the stone.  I felt that there may have been a duplicated  collecting system.  I did leave a stent in.  Since that time, the patient  has had a significant amount of pain.  He has discomfort with moving his  legs.  He came to the office today for further evaluation.  KUB revealed the  stent to be curled up in the proximal ureter.  I felt it worthwhile to  remove the stent as it was painful, as painful as the patient having a  stone.  The stone was in the left lower pole at this time.  The stent was  extracted without difficulty.  I feel it necessary at this point to admit  the patient for pain management, antibiotics, and antiemetics.   PAST MEDICAL HISTORY:  1. Lactose intolerance.  2. Mitral valve prolapse.  3. Previous kidney stones, all passed  spontaneously.   MEDICATIONS:  1. Xanax.  2. Lactate.  3. Cipro XR 1000 mg daily (the patient did not take this morning).  4. Ditropan for bladder spasms.  5. Mepergan Fortis.   SOCIAL HISTORY:  The patient is married.  He has two daughters.  He works in  Acupuncturist.  He does not use tobacco or alcohol.   PHYSICAL EXAMINATION:  GENERAL:  A healthy-appearing, but this gentleman was  in mild distress.  VITAL SIGNS:  His temperature was 100.5.  ABDOMEN:  He  had mild to moderate left CVA tenderness.  There was left upper quadrant and  left lower quadrant tenderness.  There was no hepatosplenomegaly or masses.  Bladder area was nontender.   LABORATORY DATA:  White blood cell count was normal,  hematocrit was 37%.  BMET was normal.   IMPRESSION:  1. Left renal calculus.  It was in the left upper ureter, but is now back in     the lower pole of the kidney.  2. Significant left flank pain after ureteroscopy.   PLAN:  1. We will admit the patient for IV antibiotics, pain control, hydration.  2. If the patient is comfortable by the following day, may sent him home.     He will need to be on antibiotics and pain medication.  3. Discussed further management with patient.  We feel it best to wait     several weeks for him to get back on his feet before the tackle the stone     again.  Before we do, we will need further diagnostic testing,     specifically an IVP.  4.                                               Bertram Millard. Dahlstedt, M.D.    SMD/MEDQ  D:  05/02/2002  T:  05/02/2002  Job:  [id number]

## 2010-10-22 LAB — HEPATIC FUNCTION PANEL
ALT: 21
AST: 27
Bilirubin, Direct: 0.3
Total Bilirubin: 1.3 — ABNORMAL HIGH

## 2010-10-22 LAB — URINE CULTURE: Culture: NO GROWTH

## 2010-10-22 LAB — I-STAT 8, (EC8 V) (CONVERTED LAB)
Chloride: 105
Glucose, Bld: 97
Potassium: 3.7
pH, Ven: 7.435 — ABNORMAL HIGH

## 2010-10-22 LAB — DIFFERENTIAL
Basophils Relative: 0
Eosinophils Relative: 5
Monocytes Absolute: 0.6
Monocytes Relative: 10
Neutro Abs: 3.4

## 2010-10-22 LAB — URINALYSIS, ROUTINE W REFLEX MICROSCOPIC
Bilirubin Urine: NEGATIVE
Nitrite: POSITIVE — AB
Specific Gravity, Urine: 1.015
Urobilinogen, UA: 0.2
pH: 6.5

## 2010-10-22 LAB — URINE MICROSCOPIC-ADD ON

## 2010-10-22 LAB — CBC
HCT: 43.4
Hemoglobin: 14.9
MCHC: 34.4
RBC: 4.75

## 2010-10-22 LAB — POCT CARDIAC MARKERS
CKMB, poc: <1 — ABNORMAL LOW
Myoglobin, poc: 70.9
Operator id: 272551
Troponin i, poc: <0.05

## 2010-11-18 IMAGING — CR DG CHEST 2V
3 series · 3 of 3 positions shown · non-contrast
Comparison: 10/22/2008

CLINICAL DATA: Cough, fatigue and pedal edema.

CHEST - 2 VIEW

[view not recorded (1 of 3)]
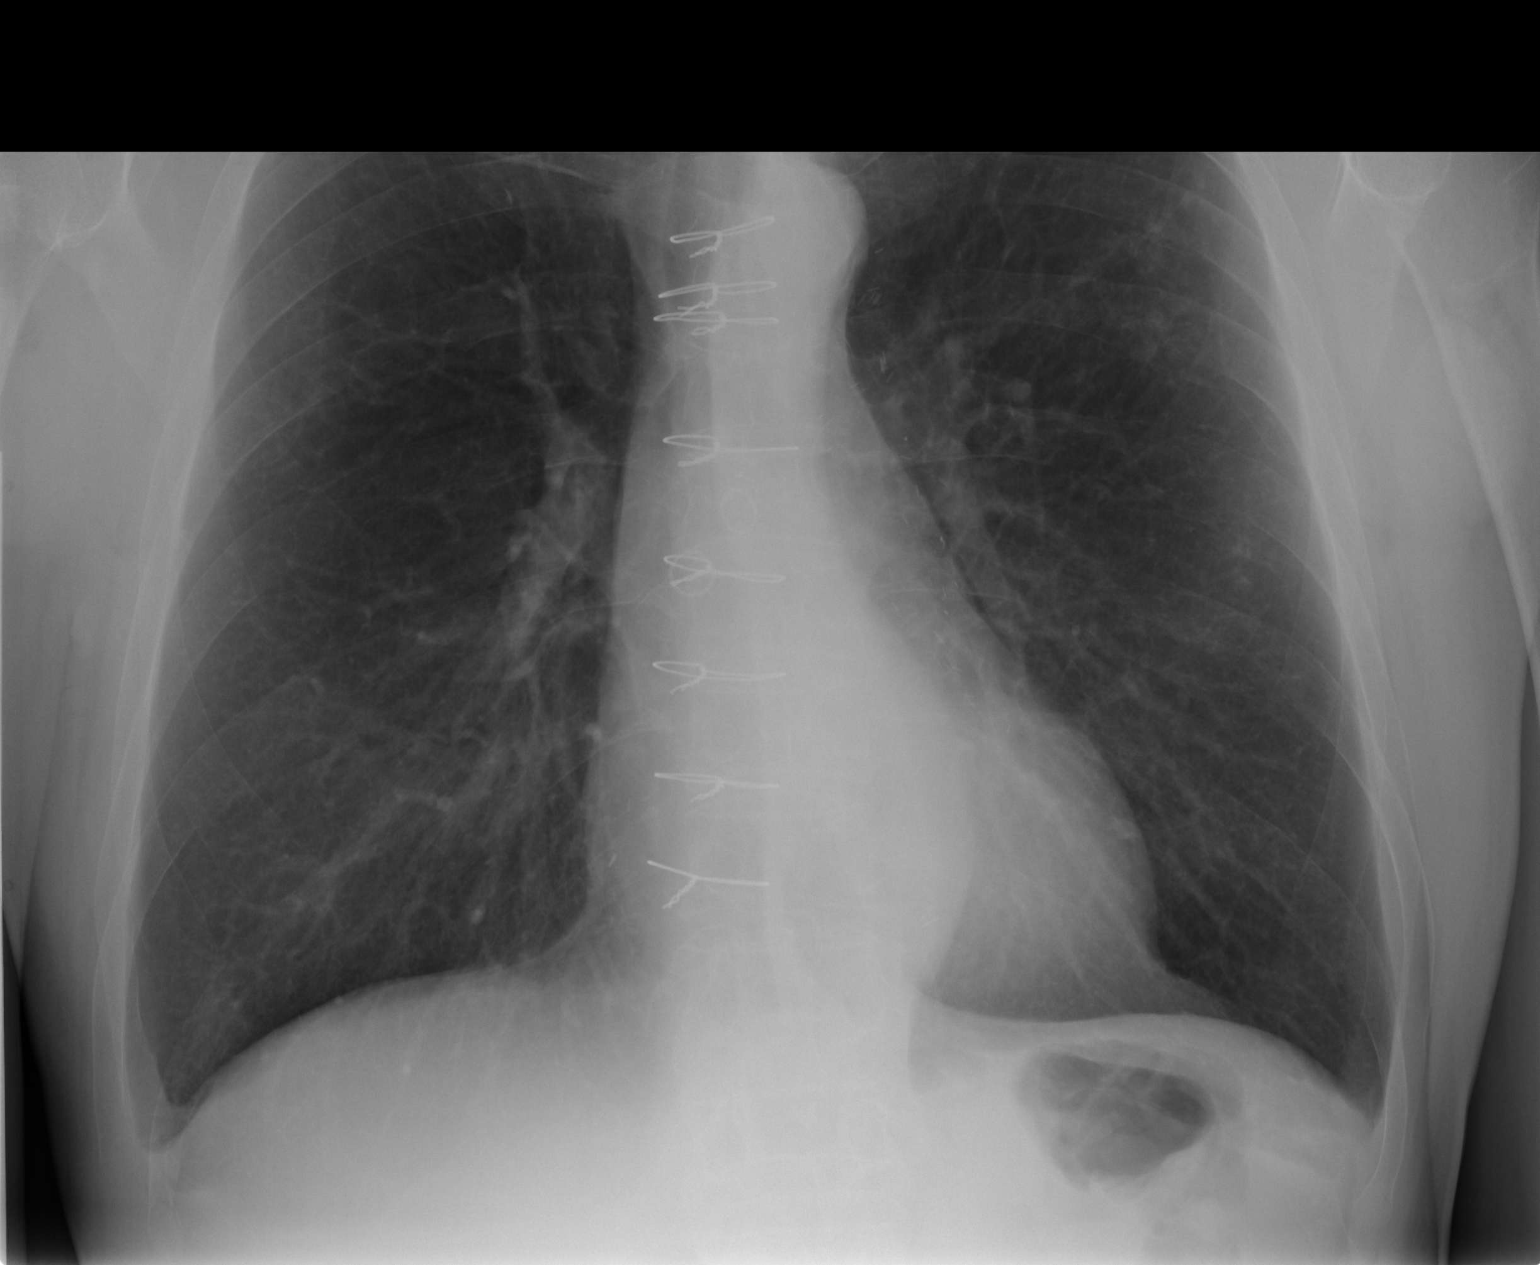

[view not recorded (2 of 3)]
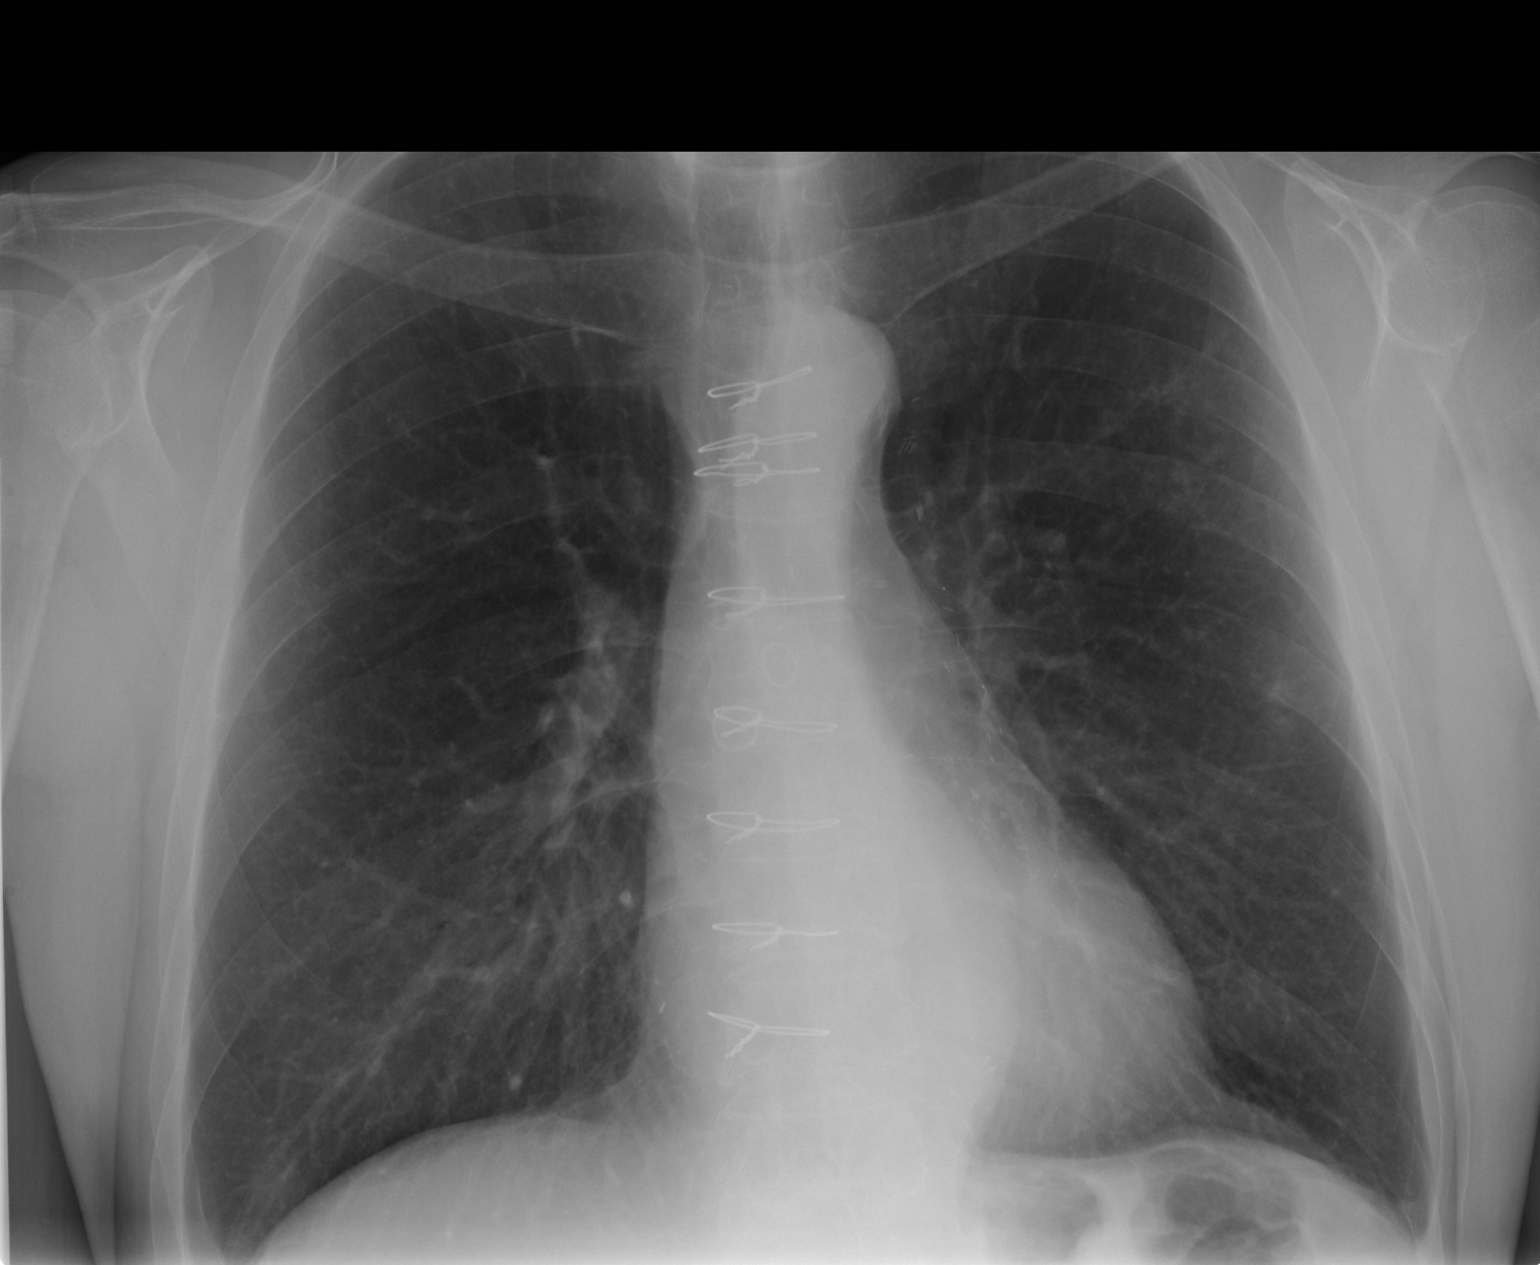

[view not recorded (3 of 3)]
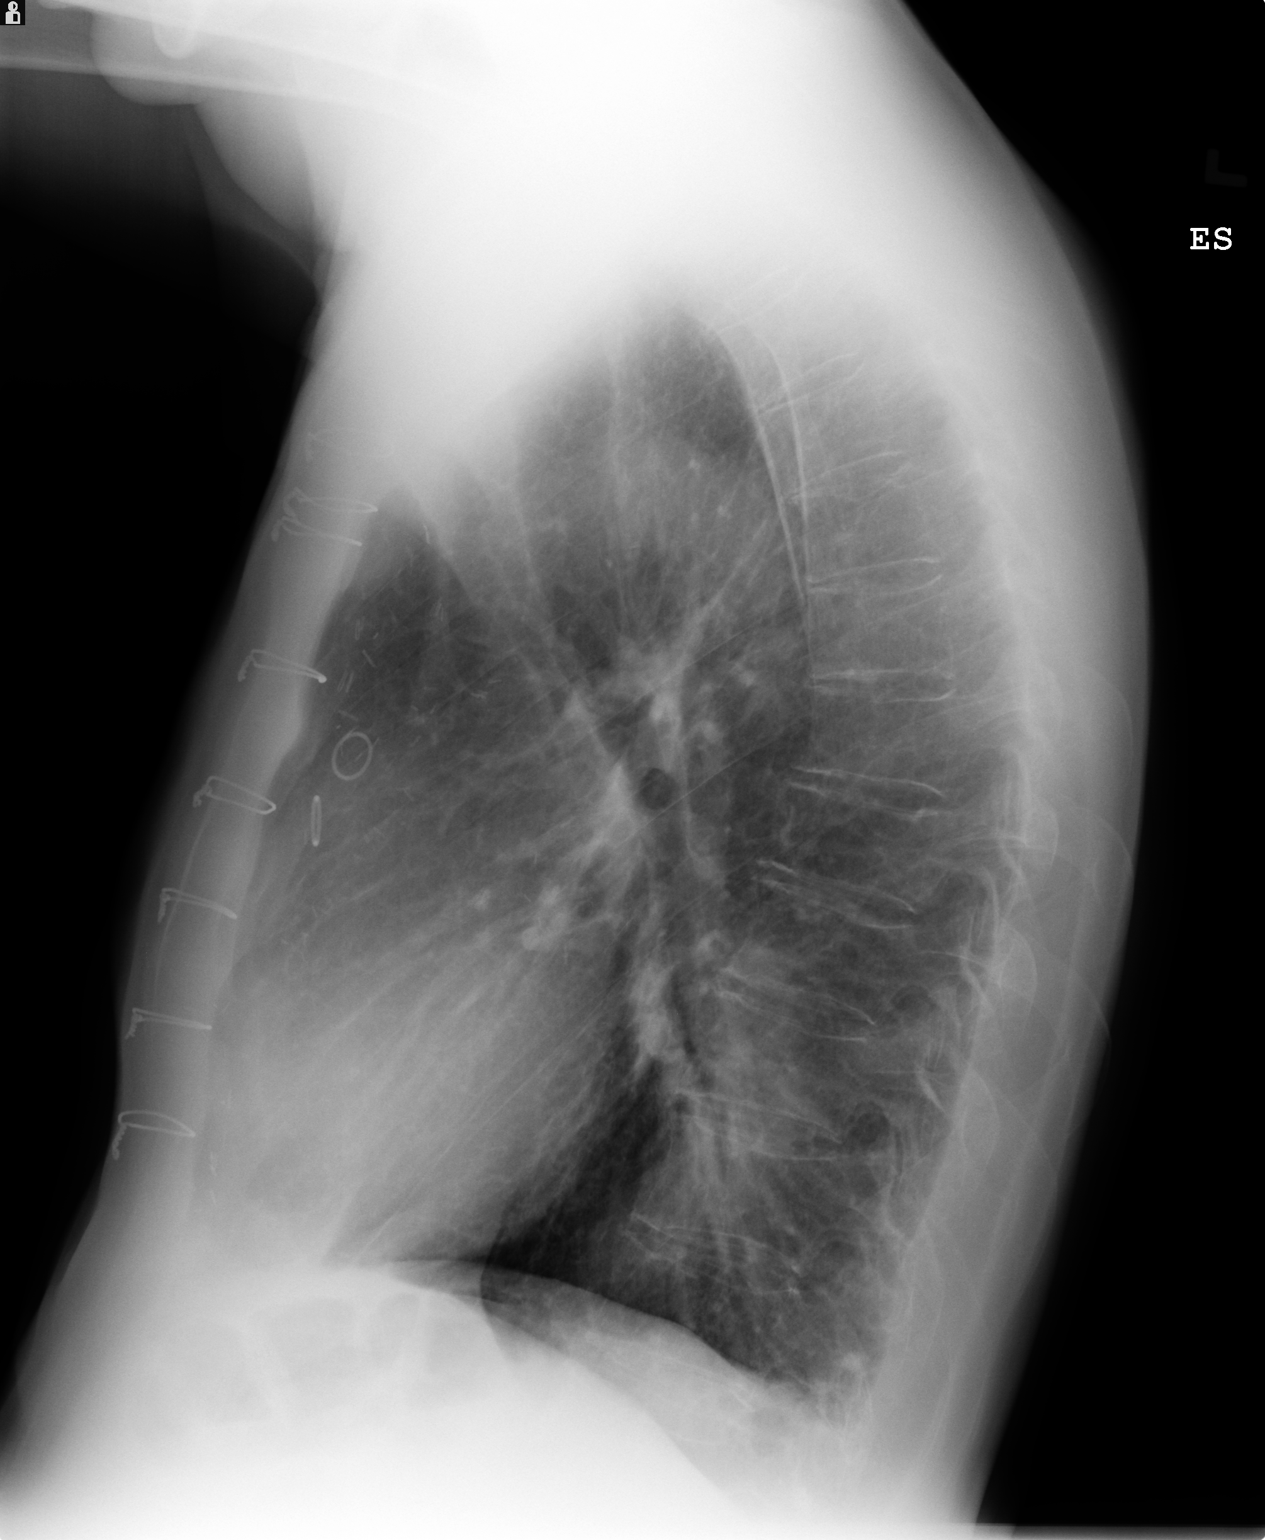

[3 of 3 positions shown; findings below may reference images not displayed]

FINDINGS: There are stable surgical changes from bypass surgery.
The cardiac silhouette, mediastinal and hilar contours are within
normal limits and stable.  Stable advanced changes of COPD without
definite acute overlying pulmonary process.  The bony thorax is
intact.
IMPRESSION: 1.  Stable changes of COPD.
2.  No acute pulmonary findings.

## 2011-09-17 DIAGNOSIS — G20 Parkinson's disease: Secondary | ICD-10-CM | POA: Diagnosis not present

## 2011-09-17 DIAGNOSIS — G4733 Obstructive sleep apnea (adult) (pediatric): Secondary | ICD-10-CM | POA: Diagnosis not present

## 2011-09-17 DIAGNOSIS — G4730 Sleep apnea, unspecified: Secondary | ICD-10-CM | POA: Diagnosis not present

## 2011-09-27 DIAGNOSIS — IMO0001 Reserved for inherently not codable concepts without codable children: Secondary | ICD-10-CM | POA: Diagnosis not present

## 2011-09-27 DIAGNOSIS — R4789 Other speech disturbances: Secondary | ICD-10-CM | POA: Diagnosis not present

## 2011-09-27 DIAGNOSIS — R1313 Dysphagia, pharyngeal phase: Secondary | ICD-10-CM | POA: Diagnosis not present

## 2011-10-12 DIAGNOSIS — G20 Parkinson's disease: Secondary | ICD-10-CM | POA: Diagnosis not present

## 2011-10-12 DIAGNOSIS — R1310 Dysphagia, unspecified: Secondary | ICD-10-CM | POA: Diagnosis not present

## 2011-10-12 DIAGNOSIS — R633 Feeding difficulties: Secondary | ICD-10-CM | POA: Diagnosis not present

## 2011-10-14 DIAGNOSIS — R4789 Other speech disturbances: Secondary | ICD-10-CM | POA: Diagnosis not present

## 2011-10-14 DIAGNOSIS — IMO0001 Reserved for inherently not codable concepts without codable children: Secondary | ICD-10-CM | POA: Diagnosis not present

## 2011-10-14 DIAGNOSIS — R1313 Dysphagia, pharyngeal phase: Secondary | ICD-10-CM | POA: Diagnosis not present

## 2011-10-19 DIAGNOSIS — IMO0001 Reserved for inherently not codable concepts without codable children: Secondary | ICD-10-CM | POA: Diagnosis not present

## 2011-10-19 DIAGNOSIS — R4789 Other speech disturbances: Secondary | ICD-10-CM | POA: Diagnosis not present

## 2011-10-19 DIAGNOSIS — R1313 Dysphagia, pharyngeal phase: Secondary | ICD-10-CM | POA: Diagnosis not present

## 2011-10-22 DIAGNOSIS — R1313 Dysphagia, pharyngeal phase: Secondary | ICD-10-CM | POA: Diagnosis not present

## 2011-10-22 DIAGNOSIS — R4789 Other speech disturbances: Secondary | ICD-10-CM | POA: Diagnosis not present

## 2011-10-22 DIAGNOSIS — IMO0001 Reserved for inherently not codable concepts without codable children: Secondary | ICD-10-CM | POA: Diagnosis not present

## 2011-10-26 DIAGNOSIS — R1313 Dysphagia, pharyngeal phase: Secondary | ICD-10-CM | POA: Diagnosis not present

## 2011-10-26 DIAGNOSIS — R4789 Other speech disturbances: Secondary | ICD-10-CM | POA: Diagnosis not present

## 2011-10-26 DIAGNOSIS — IMO0001 Reserved for inherently not codable concepts without codable children: Secondary | ICD-10-CM | POA: Diagnosis not present

## 2011-10-28 DIAGNOSIS — IMO0001 Reserved for inherently not codable concepts without codable children: Secondary | ICD-10-CM | POA: Diagnosis not present

## 2011-10-28 DIAGNOSIS — R4789 Other speech disturbances: Secondary | ICD-10-CM | POA: Diagnosis not present

## 2011-10-28 DIAGNOSIS — R1313 Dysphagia, pharyngeal phase: Secondary | ICD-10-CM | POA: Diagnosis not present

## 2011-11-02 DIAGNOSIS — R1310 Dysphagia, unspecified: Secondary | ICD-10-CM | POA: Diagnosis not present

## 2011-11-02 DIAGNOSIS — R1313 Dysphagia, pharyngeal phase: Secondary | ICD-10-CM | POA: Diagnosis not present

## 2011-11-02 DIAGNOSIS — R4789 Other speech disturbances: Secondary | ICD-10-CM | POA: Diagnosis not present

## 2011-11-02 DIAGNOSIS — IMO0001 Reserved for inherently not codable concepts without codable children: Secondary | ICD-10-CM | POA: Diagnosis not present

## 2011-11-04 DIAGNOSIS — R4789 Other speech disturbances: Secondary | ICD-10-CM | POA: Diagnosis not present

## 2011-11-04 DIAGNOSIS — R1313 Dysphagia, pharyngeal phase: Secondary | ICD-10-CM | POA: Diagnosis not present

## 2011-11-04 DIAGNOSIS — IMO0001 Reserved for inherently not codable concepts without codable children: Secondary | ICD-10-CM | POA: Diagnosis not present

## 2011-11-08 DIAGNOSIS — R4789 Other speech disturbances: Secondary | ICD-10-CM | POA: Diagnosis not present

## 2011-11-08 DIAGNOSIS — R1313 Dysphagia, pharyngeal phase: Secondary | ICD-10-CM | POA: Diagnosis not present

## 2011-11-08 DIAGNOSIS — IMO0001 Reserved for inherently not codable concepts without codable children: Secondary | ICD-10-CM | POA: Diagnosis not present

## 2011-11-11 DIAGNOSIS — R4789 Other speech disturbances: Secondary | ICD-10-CM | POA: Diagnosis not present

## 2011-11-11 DIAGNOSIS — IMO0001 Reserved for inherently not codable concepts without codable children: Secondary | ICD-10-CM | POA: Diagnosis not present

## 2011-11-11 DIAGNOSIS — R1313 Dysphagia, pharyngeal phase: Secondary | ICD-10-CM | POA: Diagnosis not present

## 2011-11-16 DIAGNOSIS — R4789 Other speech disturbances: Secondary | ICD-10-CM | POA: Diagnosis not present

## 2011-11-16 DIAGNOSIS — IMO0001 Reserved for inherently not codable concepts without codable children: Secondary | ICD-10-CM | POA: Diagnosis not present

## 2011-11-16 DIAGNOSIS — R1313 Dysphagia, pharyngeal phase: Secondary | ICD-10-CM | POA: Diagnosis not present

## 2011-11-18 DIAGNOSIS — R1313 Dysphagia, pharyngeal phase: Secondary | ICD-10-CM | POA: Diagnosis not present

## 2011-11-18 DIAGNOSIS — IMO0001 Reserved for inherently not codable concepts without codable children: Secondary | ICD-10-CM | POA: Diagnosis not present

## 2011-11-18 DIAGNOSIS — R4789 Other speech disturbances: Secondary | ICD-10-CM | POA: Diagnosis not present

## 2011-11-23 DIAGNOSIS — IMO0001 Reserved for inherently not codable concepts without codable children: Secondary | ICD-10-CM | POA: Diagnosis not present

## 2011-11-23 DIAGNOSIS — R1313 Dysphagia, pharyngeal phase: Secondary | ICD-10-CM | POA: Diagnosis not present

## 2011-11-23 DIAGNOSIS — R4789 Other speech disturbances: Secondary | ICD-10-CM | POA: Diagnosis not present

## 2011-11-24 DIAGNOSIS — J329 Chronic sinusitis, unspecified: Secondary | ICD-10-CM | POA: Diagnosis not present

## 2011-11-26 DIAGNOSIS — R1310 Dysphagia, unspecified: Secondary | ICD-10-CM | POA: Diagnosis not present

## 2011-11-26 DIAGNOSIS — G20 Parkinson's disease: Secondary | ICD-10-CM | POA: Diagnosis not present

## 2011-11-30 DIAGNOSIS — G4730 Sleep apnea, unspecified: Secondary | ICD-10-CM | POA: Diagnosis not present

## 2011-11-30 DIAGNOSIS — R1310 Dysphagia, unspecified: Secondary | ICD-10-CM | POA: Diagnosis not present

## 2011-11-30 DIAGNOSIS — G4733 Obstructive sleep apnea (adult) (pediatric): Secondary | ICD-10-CM | POA: Diagnosis not present

## 2011-11-30 DIAGNOSIS — G20 Parkinson's disease: Secondary | ICD-10-CM | POA: Diagnosis not present

## 2011-12-02 DIAGNOSIS — R1313 Dysphagia, pharyngeal phase: Secondary | ICD-10-CM | POA: Diagnosis not present

## 2011-12-02 DIAGNOSIS — IMO0001 Reserved for inherently not codable concepts without codable children: Secondary | ICD-10-CM | POA: Diagnosis not present

## 2011-12-02 DIAGNOSIS — R4789 Other speech disturbances: Secondary | ICD-10-CM | POA: Diagnosis not present

## 2011-12-07 DIAGNOSIS — R4789 Other speech disturbances: Secondary | ICD-10-CM | POA: Diagnosis not present

## 2011-12-07 DIAGNOSIS — R1313 Dysphagia, pharyngeal phase: Secondary | ICD-10-CM | POA: Diagnosis not present

## 2011-12-07 DIAGNOSIS — IMO0001 Reserved for inherently not codable concepts without codable children: Secondary | ICD-10-CM | POA: Diagnosis not present

## 2011-12-09 DIAGNOSIS — R1313 Dysphagia, pharyngeal phase: Secondary | ICD-10-CM | POA: Diagnosis not present

## 2011-12-09 DIAGNOSIS — IMO0001 Reserved for inherently not codable concepts without codable children: Secondary | ICD-10-CM | POA: Diagnosis not present

## 2011-12-09 DIAGNOSIS — R4789 Other speech disturbances: Secondary | ICD-10-CM | POA: Diagnosis not present

## 2011-12-10 DIAGNOSIS — Z23 Encounter for immunization: Secondary | ICD-10-CM | POA: Diagnosis not present

## 2011-12-14 DIAGNOSIS — R1313 Dysphagia, pharyngeal phase: Secondary | ICD-10-CM | POA: Diagnosis not present

## 2011-12-14 DIAGNOSIS — IMO0001 Reserved for inherently not codable concepts without codable children: Secondary | ICD-10-CM | POA: Diagnosis not present

## 2011-12-14 DIAGNOSIS — R4789 Other speech disturbances: Secondary | ICD-10-CM | POA: Diagnosis not present

## 2011-12-16 DIAGNOSIS — R4789 Other speech disturbances: Secondary | ICD-10-CM | POA: Diagnosis not present

## 2011-12-16 DIAGNOSIS — R1313 Dysphagia, pharyngeal phase: Secondary | ICD-10-CM | POA: Diagnosis not present

## 2011-12-16 DIAGNOSIS — IMO0001 Reserved for inherently not codable concepts without codable children: Secondary | ICD-10-CM | POA: Diagnosis not present

## 2012-01-13 DIAGNOSIS — F411 Generalized anxiety disorder: Secondary | ICD-10-CM | POA: Diagnosis not present

## 2012-01-13 DIAGNOSIS — E785 Hyperlipidemia, unspecified: Secondary | ICD-10-CM | POA: Diagnosis not present

## 2012-01-13 DIAGNOSIS — I10 Essential (primary) hypertension: Secondary | ICD-10-CM | POA: Diagnosis not present

## 2012-06-19 DIAGNOSIS — N419 Inflammatory disease of prostate, unspecified: Secondary | ICD-10-CM | POA: Diagnosis not present

## 2012-06-19 DIAGNOSIS — G20 Parkinson's disease: Secondary | ICD-10-CM | POA: Diagnosis not present

## 2012-07-12 DIAGNOSIS — G4730 Sleep apnea, unspecified: Secondary | ICD-10-CM | POA: Diagnosis not present

## 2012-07-12 DIAGNOSIS — R002 Palpitations: Secondary | ICD-10-CM | POA: Diagnosis not present

## 2012-07-12 DIAGNOSIS — G2581 Restless legs syndrome: Secondary | ICD-10-CM | POA: Diagnosis not present

## 2012-07-12 DIAGNOSIS — I2510 Atherosclerotic heart disease of native coronary artery without angina pectoris: Secondary | ICD-10-CM | POA: Diagnosis not present

## 2012-07-13 DIAGNOSIS — F411 Generalized anxiety disorder: Secondary | ICD-10-CM | POA: Diagnosis not present

## 2012-07-13 DIAGNOSIS — G20 Parkinson's disease: Secondary | ICD-10-CM | POA: Diagnosis not present

## 2012-07-13 DIAGNOSIS — Z125 Encounter for screening for malignant neoplasm of prostate: Secondary | ICD-10-CM | POA: Diagnosis not present

## 2012-07-13 DIAGNOSIS — I10 Essential (primary) hypertension: Secondary | ICD-10-CM | POA: Diagnosis not present

## 2012-07-13 DIAGNOSIS — E785 Hyperlipidemia, unspecified: Secondary | ICD-10-CM | POA: Diagnosis not present

## 2012-09-08 DIAGNOSIS — H43399 Other vitreous opacities, unspecified eye: Secondary | ICD-10-CM | POA: Diagnosis not present

## 2012-09-08 DIAGNOSIS — H35379 Puckering of macula, unspecified eye: Secondary | ICD-10-CM | POA: Diagnosis not present

## 2012-09-08 DIAGNOSIS — H43819 Vitreous degeneration, unspecified eye: Secondary | ICD-10-CM | POA: Diagnosis not present

## 2012-09-08 DIAGNOSIS — H35369 Drusen (degenerative) of macula, unspecified eye: Secondary | ICD-10-CM | POA: Diagnosis not present

## 2012-09-28 DIAGNOSIS — H4310 Vitreous hemorrhage, unspecified eye: Secondary | ICD-10-CM | POA: Diagnosis not present

## 2012-09-28 DIAGNOSIS — H43399 Other vitreous opacities, unspecified eye: Secondary | ICD-10-CM | POA: Diagnosis not present

## 2012-09-28 DIAGNOSIS — H26499 Other secondary cataract, unspecified eye: Secondary | ICD-10-CM | POA: Diagnosis not present

## 2012-10-11 DIAGNOSIS — G20 Parkinson's disease: Secondary | ICD-10-CM | POA: Diagnosis not present

## 2012-10-11 DIAGNOSIS — K3189 Other diseases of stomach and duodenum: Secondary | ICD-10-CM | POA: Diagnosis not present

## 2012-10-13 DIAGNOSIS — R279 Unspecified lack of coordination: Secondary | ICD-10-CM | POA: Diagnosis not present

## 2012-10-13 DIAGNOSIS — IMO0001 Reserved for inherently not codable concepts without codable children: Secondary | ICD-10-CM | POA: Diagnosis not present

## 2012-10-13 DIAGNOSIS — G20 Parkinson's disease: Secondary | ICD-10-CM | POA: Diagnosis not present

## 2012-10-13 DIAGNOSIS — R269 Unspecified abnormalities of gait and mobility: Secondary | ICD-10-CM | POA: Diagnosis not present

## 2012-10-16 DIAGNOSIS — R279 Unspecified lack of coordination: Secondary | ICD-10-CM | POA: Diagnosis not present

## 2012-10-16 DIAGNOSIS — IMO0001 Reserved for inherently not codable concepts without codable children: Secondary | ICD-10-CM | POA: Diagnosis not present

## 2012-10-16 DIAGNOSIS — G20 Parkinson's disease: Secondary | ICD-10-CM | POA: Diagnosis not present

## 2012-10-16 DIAGNOSIS — R269 Unspecified abnormalities of gait and mobility: Secondary | ICD-10-CM | POA: Diagnosis not present

## 2012-10-17 DIAGNOSIS — J029 Acute pharyngitis, unspecified: Secondary | ICD-10-CM | POA: Diagnosis not present

## 2012-10-17 DIAGNOSIS — B9789 Other viral agents as the cause of diseases classified elsewhere: Secondary | ICD-10-CM | POA: Diagnosis not present

## 2012-10-30 DIAGNOSIS — R279 Unspecified lack of coordination: Secondary | ICD-10-CM | POA: Diagnosis not present

## 2012-10-30 DIAGNOSIS — R269 Unspecified abnormalities of gait and mobility: Secondary | ICD-10-CM | POA: Diagnosis not present

## 2012-10-30 DIAGNOSIS — IMO0001 Reserved for inherently not codable concepts without codable children: Secondary | ICD-10-CM | POA: Diagnosis not present

## 2012-10-30 DIAGNOSIS — G20 Parkinson's disease: Secondary | ICD-10-CM | POA: Diagnosis not present

## 2012-11-01 DIAGNOSIS — G20 Parkinson's disease: Secondary | ICD-10-CM | POA: Diagnosis not present

## 2012-11-01 DIAGNOSIS — R279 Unspecified lack of coordination: Secondary | ICD-10-CM | POA: Diagnosis not present

## 2012-11-01 DIAGNOSIS — IMO0001 Reserved for inherently not codable concepts without codable children: Secondary | ICD-10-CM | POA: Diagnosis not present

## 2012-11-01 DIAGNOSIS — R269 Unspecified abnormalities of gait and mobility: Secondary | ICD-10-CM | POA: Diagnosis not present

## 2012-11-03 DIAGNOSIS — G20 Parkinson's disease: Secondary | ICD-10-CM | POA: Diagnosis not present

## 2012-11-03 DIAGNOSIS — IMO0001 Reserved for inherently not codable concepts without codable children: Secondary | ICD-10-CM | POA: Diagnosis not present

## 2012-11-03 DIAGNOSIS — R279 Unspecified lack of coordination: Secondary | ICD-10-CM | POA: Diagnosis not present

## 2012-11-03 DIAGNOSIS — R269 Unspecified abnormalities of gait and mobility: Secondary | ICD-10-CM | POA: Diagnosis not present

## 2012-11-06 DIAGNOSIS — R279 Unspecified lack of coordination: Secondary | ICD-10-CM | POA: Diagnosis not present

## 2012-11-06 DIAGNOSIS — IMO0001 Reserved for inherently not codable concepts without codable children: Secondary | ICD-10-CM | POA: Diagnosis not present

## 2012-11-06 DIAGNOSIS — R269 Unspecified abnormalities of gait and mobility: Secondary | ICD-10-CM | POA: Diagnosis not present

## 2012-11-06 DIAGNOSIS — G20 Parkinson's disease: Secondary | ICD-10-CM | POA: Diagnosis not present

## 2012-11-08 DIAGNOSIS — G20 Parkinson's disease: Secondary | ICD-10-CM | POA: Diagnosis not present

## 2012-11-08 DIAGNOSIS — IMO0001 Reserved for inherently not codable concepts without codable children: Secondary | ICD-10-CM | POA: Diagnosis not present

## 2012-11-08 DIAGNOSIS — R269 Unspecified abnormalities of gait and mobility: Secondary | ICD-10-CM | POA: Diagnosis not present

## 2012-11-08 DIAGNOSIS — R279 Unspecified lack of coordination: Secondary | ICD-10-CM | POA: Diagnosis not present

## 2012-11-13 DIAGNOSIS — IMO0001 Reserved for inherently not codable concepts without codable children: Secondary | ICD-10-CM | POA: Diagnosis not present

## 2012-11-13 DIAGNOSIS — R269 Unspecified abnormalities of gait and mobility: Secondary | ICD-10-CM | POA: Diagnosis not present

## 2012-11-13 DIAGNOSIS — G20 Parkinson's disease: Secondary | ICD-10-CM | POA: Diagnosis not present

## 2012-11-13 DIAGNOSIS — R279 Unspecified lack of coordination: Secondary | ICD-10-CM | POA: Diagnosis not present

## 2012-11-14 DIAGNOSIS — H35719 Central serous chorioretinopathy, unspecified eye: Secondary | ICD-10-CM | POA: Diagnosis not present

## 2012-11-14 DIAGNOSIS — H04129 Dry eye syndrome of unspecified lacrimal gland: Secondary | ICD-10-CM | POA: Diagnosis not present

## 2012-11-17 DIAGNOSIS — Z23 Encounter for immunization: Secondary | ICD-10-CM | POA: Diagnosis not present

## 2012-11-20 DIAGNOSIS — IMO0001 Reserved for inherently not codable concepts without codable children: Secondary | ICD-10-CM | POA: Diagnosis not present

## 2012-11-20 DIAGNOSIS — R269 Unspecified abnormalities of gait and mobility: Secondary | ICD-10-CM | POA: Diagnosis not present

## 2012-11-20 DIAGNOSIS — G20 Parkinson's disease: Secondary | ICD-10-CM | POA: Diagnosis not present

## 2012-11-20 DIAGNOSIS — R279 Unspecified lack of coordination: Secondary | ICD-10-CM | POA: Diagnosis not present

## 2012-12-01 DIAGNOSIS — H01029 Squamous blepharitis unspecified eye, unspecified eyelid: Secondary | ICD-10-CM | POA: Diagnosis not present

## 2012-12-01 DIAGNOSIS — H04129 Dry eye syndrome of unspecified lacrimal gland: Secondary | ICD-10-CM | POA: Diagnosis not present

## 2012-12-19 DIAGNOSIS — R198 Other specified symptoms and signs involving the digestive system and abdomen: Secondary | ICD-10-CM | POA: Diagnosis not present

## 2013-01-01 DIAGNOSIS — K648 Other hemorrhoids: Secondary | ICD-10-CM | POA: Diagnosis not present

## 2013-01-01 DIAGNOSIS — Z1211 Encounter for screening for malignant neoplasm of colon: Secondary | ICD-10-CM | POA: Diagnosis not present

## 2013-01-01 DIAGNOSIS — K5730 Diverticulosis of large intestine without perforation or abscess without bleeding: Secondary | ICD-10-CM | POA: Diagnosis not present

## 2013-01-10 DIAGNOSIS — G20 Parkinson's disease: Secondary | ICD-10-CM | POA: Diagnosis not present

## 2013-01-10 DIAGNOSIS — E785 Hyperlipidemia, unspecified: Secondary | ICD-10-CM | POA: Diagnosis not present

## 2013-01-10 DIAGNOSIS — I10 Essential (primary) hypertension: Secondary | ICD-10-CM | POA: Diagnosis not present

## 2013-01-10 DIAGNOSIS — F411 Generalized anxiety disorder: Secondary | ICD-10-CM | POA: Diagnosis not present

## 2013-01-29 DIAGNOSIS — J209 Acute bronchitis, unspecified: Secondary | ICD-10-CM | POA: Diagnosis not present

## 2013-02-20 DIAGNOSIS — G20 Parkinson's disease: Secondary | ICD-10-CM | POA: Diagnosis not present

## 2013-02-20 DIAGNOSIS — R6889 Other general symptoms and signs: Secondary | ICD-10-CM | POA: Diagnosis not present

## 2013-02-20 DIAGNOSIS — F411 Generalized anxiety disorder: Secondary | ICD-10-CM | POA: Diagnosis not present

## 2013-02-20 DIAGNOSIS — G3184 Mild cognitive impairment, so stated: Secondary | ICD-10-CM | POA: Diagnosis not present

## 2013-04-09 DIAGNOSIS — F3289 Other specified depressive episodes: Secondary | ICD-10-CM | POA: Diagnosis not present

## 2013-04-09 DIAGNOSIS — F329 Major depressive disorder, single episode, unspecified: Secondary | ICD-10-CM | POA: Diagnosis not present

## 2013-04-09 DIAGNOSIS — L0231 Cutaneous abscess of buttock: Secondary | ICD-10-CM | POA: Diagnosis not present

## 2013-04-09 DIAGNOSIS — I10 Essential (primary) hypertension: Secondary | ICD-10-CM | POA: Diagnosis not present

## 2013-04-09 DIAGNOSIS — F411 Generalized anxiety disorder: Secondary | ICD-10-CM | POA: Diagnosis not present

## 2013-04-09 DIAGNOSIS — L03317 Cellulitis of buttock: Secondary | ICD-10-CM | POA: Diagnosis not present

## 2013-04-09 DIAGNOSIS — G20 Parkinson's disease: Secondary | ICD-10-CM | POA: Diagnosis not present

## 2013-04-17 DIAGNOSIS — G20 Parkinson's disease: Secondary | ICD-10-CM | POA: Diagnosis not present

## 2013-04-17 DIAGNOSIS — H538 Other visual disturbances: Secondary | ICD-10-CM | POA: Diagnosis not present

## 2013-07-03 DIAGNOSIS — R197 Diarrhea, unspecified: Secondary | ICD-10-CM | POA: Diagnosis not present

## 2013-07-11 DIAGNOSIS — K439 Ventral hernia without obstruction or gangrene: Secondary | ICD-10-CM | POA: Diagnosis not present

## 2013-07-11 DIAGNOSIS — F329 Major depressive disorder, single episode, unspecified: Secondary | ICD-10-CM | POA: Diagnosis not present

## 2013-07-11 DIAGNOSIS — J309 Allergic rhinitis, unspecified: Secondary | ICD-10-CM | POA: Diagnosis not present

## 2013-07-11 DIAGNOSIS — L2089 Other atopic dermatitis: Secondary | ICD-10-CM | POA: Diagnosis not present

## 2013-07-11 DIAGNOSIS — F411 Generalized anxiety disorder: Secondary | ICD-10-CM | POA: Diagnosis not present

## 2013-07-11 DIAGNOSIS — F3289 Other specified depressive episodes: Secondary | ICD-10-CM | POA: Diagnosis not present

## 2013-07-11 DIAGNOSIS — I10 Essential (primary) hypertension: Secondary | ICD-10-CM | POA: Diagnosis not present

## 2013-07-11 DIAGNOSIS — R42 Dizziness and giddiness: Secondary | ICD-10-CM | POA: Diagnosis not present

## 2013-07-16 DIAGNOSIS — K589 Irritable bowel syndrome without diarrhea: Secondary | ICD-10-CM | POA: Diagnosis not present

## 2013-07-17 DIAGNOSIS — H538 Other visual disturbances: Secondary | ICD-10-CM | POA: Diagnosis not present

## 2013-07-17 DIAGNOSIS — G20 Parkinson's disease: Secondary | ICD-10-CM | POA: Diagnosis not present

## 2013-07-17 DIAGNOSIS — H532 Diplopia: Secondary | ICD-10-CM | POA: Diagnosis not present

## 2013-07-25 DIAGNOSIS — H5053 Vertical heterophoria: Secondary | ICD-10-CM | POA: Insufficient documentation

## 2013-07-25 DIAGNOSIS — H5050 Unspecified heterophoria: Secondary | ICD-10-CM | POA: Diagnosis not present

## 2013-08-28 DIAGNOSIS — R197 Diarrhea, unspecified: Secondary | ICD-10-CM | POA: Diagnosis not present

## 2013-08-28 DIAGNOSIS — R6881 Early satiety: Secondary | ICD-10-CM | POA: Diagnosis not present

## 2013-08-28 DIAGNOSIS — R1310 Dysphagia, unspecified: Secondary | ICD-10-CM | POA: Diagnosis not present

## 2013-09-03 DIAGNOSIS — E785 Hyperlipidemia, unspecified: Secondary | ICD-10-CM | POA: Diagnosis not present

## 2013-09-03 DIAGNOSIS — I4949 Other premature depolarization: Secondary | ICD-10-CM | POA: Diagnosis not present

## 2013-09-03 DIAGNOSIS — I2510 Atherosclerotic heart disease of native coronary artery without angina pectoris: Secondary | ICD-10-CM | POA: Diagnosis not present

## 2013-09-14 DIAGNOSIS — R197 Diarrhea, unspecified: Secondary | ICD-10-CM | POA: Diagnosis not present

## 2013-09-27 ENCOUNTER — Other Ambulatory Visit: Payer: Self-pay | Admitting: Gastroenterology

## 2013-09-27 ENCOUNTER — Ambulatory Visit
Admission: RE | Admit: 2013-09-27 | Discharge: 2013-09-27 | Disposition: A | Discharge status: Home / Self Care | Payer: Medicare Other | Source: Ambulatory Visit | Attending: Gastroenterology | Admitting: Gastroenterology

## 2013-09-27 DIAGNOSIS — R197 Diarrhea, unspecified: Secondary | ICD-10-CM | POA: Diagnosis not present

## 2013-09-27 DIAGNOSIS — R198 Other specified symptoms and signs involving the digestive system and abdomen: Secondary | ICD-10-CM

## 2013-09-27 DIAGNOSIS — G20 Parkinson's disease: Secondary | ICD-10-CM

## 2013-09-27 DIAGNOSIS — N200 Calculus of kidney: Secondary | ICD-10-CM | POA: Diagnosis not present

## 2013-09-27 DIAGNOSIS — R1310 Dysphagia, unspecified: Secondary | ICD-10-CM | POA: Diagnosis not present

## 2013-09-28 DIAGNOSIS — F411 Generalized anxiety disorder: Secondary | ICD-10-CM | POA: Diagnosis not present

## 2013-09-28 DIAGNOSIS — G20 Parkinson's disease: Secondary | ICD-10-CM | POA: Diagnosis not present

## 2013-10-22 DIAGNOSIS — G20 Parkinson's disease: Secondary | ICD-10-CM | POA: Diagnosis not present

## 2013-10-22 DIAGNOSIS — H532 Diplopia: Secondary | ICD-10-CM | POA: Diagnosis not present

## 2013-10-22 DIAGNOSIS — H5050 Unspecified heterophoria: Secondary | ICD-10-CM | POA: Diagnosis not present

## 2013-10-22 DIAGNOSIS — H04129 Dry eye syndrome of unspecified lacrimal gland: Secondary | ICD-10-CM | POA: Diagnosis not present

## 2013-10-23 DIAGNOSIS — H5050 Unspecified heterophoria: Secondary | ICD-10-CM | POA: Diagnosis not present

## 2013-10-30 DIAGNOSIS — Z23 Encounter for immunization: Secondary | ICD-10-CM | POA: Diagnosis not present

## 2013-11-16 DIAGNOSIS — H532 Diplopia: Secondary | ICD-10-CM | POA: Diagnosis not present

## 2013-11-16 DIAGNOSIS — H35372 Puckering of macula, left eye: Secondary | ICD-10-CM | POA: Diagnosis not present

## 2013-11-23 DIAGNOSIS — H04123 Dry eye syndrome of bilateral lacrimal glands: Secondary | ICD-10-CM | POA: Diagnosis not present

## 2013-11-23 DIAGNOSIS — H532 Diplopia: Secondary | ICD-10-CM | POA: Diagnosis not present

## 2013-11-30 DIAGNOSIS — J301 Allergic rhinitis due to pollen: Secondary | ICD-10-CM | POA: Diagnosis not present

## 2013-11-30 DIAGNOSIS — J300 Vasomotor rhinitis: Secondary | ICD-10-CM | POA: Diagnosis not present

## 2013-11-30 DIAGNOSIS — E782 Mixed hyperlipidemia: Secondary | ICD-10-CM | POA: Diagnosis not present

## 2013-11-30 DIAGNOSIS — G4700 Insomnia, unspecified: Secondary | ICD-10-CM | POA: Diagnosis not present

## 2013-11-30 DIAGNOSIS — I2510 Atherosclerotic heart disease of native coronary artery without angina pectoris: Secondary | ICD-10-CM | POA: Diagnosis not present

## 2013-11-30 DIAGNOSIS — F419 Anxiety disorder, unspecified: Secondary | ICD-10-CM | POA: Diagnosis not present

## 2013-11-30 DIAGNOSIS — I10 Essential (primary) hypertension: Secondary | ICD-10-CM | POA: Diagnosis not present

## 2013-11-30 DIAGNOSIS — G20 Parkinson's disease: Secondary | ICD-10-CM | POA: Diagnosis not present

## 2013-12-21 DIAGNOSIS — F411 Generalized anxiety disorder: Secondary | ICD-10-CM | POA: Diagnosis not present

## 2013-12-21 DIAGNOSIS — F4312 Post-traumatic stress disorder, chronic: Secondary | ICD-10-CM | POA: Diagnosis not present

## 2013-12-21 DIAGNOSIS — F410 Panic disorder [episodic paroxysmal anxiety] without agoraphobia: Secondary | ICD-10-CM | POA: Diagnosis not present

## 2013-12-21 DIAGNOSIS — Z7982 Long term (current) use of aspirin: Secondary | ICD-10-CM | POA: Diagnosis not present

## 2013-12-21 DIAGNOSIS — G20 Parkinson's disease: Secondary | ICD-10-CM | POA: Diagnosis not present

## 2013-12-21 DIAGNOSIS — F329 Major depressive disorder, single episode, unspecified: Secondary | ICD-10-CM | POA: Diagnosis not present

## 2013-12-26 DIAGNOSIS — J329 Chronic sinusitis, unspecified: Secondary | ICD-10-CM | POA: Diagnosis not present

## 2014-01-10 DIAGNOSIS — F419 Anxiety disorder, unspecified: Secondary | ICD-10-CM | POA: Diagnosis not present

## 2014-01-10 DIAGNOSIS — F329 Major depressive disorder, single episode, unspecified: Secondary | ICD-10-CM | POA: Diagnosis not present

## 2014-01-10 DIAGNOSIS — E782 Mixed hyperlipidemia: Secondary | ICD-10-CM | POA: Diagnosis not present

## 2014-01-10 DIAGNOSIS — Z125 Encounter for screening for malignant neoplasm of prostate: Secondary | ICD-10-CM | POA: Diagnosis not present

## 2014-01-10 DIAGNOSIS — I10 Essential (primary) hypertension: Secondary | ICD-10-CM | POA: Diagnosis not present

## 2014-01-11 DIAGNOSIS — E782 Mixed hyperlipidemia: Secondary | ICD-10-CM | POA: Diagnosis not present

## 2014-01-11 DIAGNOSIS — Z125 Encounter for screening for malignant neoplasm of prostate: Secondary | ICD-10-CM | POA: Diagnosis not present

## 2014-02-06 DIAGNOSIS — R498 Other voice and resonance disorders: Secondary | ICD-10-CM | POA: Diagnosis not present

## 2014-02-06 DIAGNOSIS — G20 Parkinson's disease: Secondary | ICD-10-CM | POA: Diagnosis not present

## 2014-04-02 DIAGNOSIS — H35371 Puckering of macula, right eye: Secondary | ICD-10-CM | POA: Diagnosis not present

## 2014-04-02 DIAGNOSIS — H532 Diplopia: Secondary | ICD-10-CM | POA: Diagnosis not present

## 2014-04-12 DIAGNOSIS — J300 Vasomotor rhinitis: Secondary | ICD-10-CM | POA: Diagnosis not present

## 2014-04-12 DIAGNOSIS — I10 Essential (primary) hypertension: Secondary | ICD-10-CM | POA: Diagnosis not present

## 2014-04-12 DIAGNOSIS — E782 Mixed hyperlipidemia: Secondary | ICD-10-CM | POA: Diagnosis not present

## 2014-04-12 DIAGNOSIS — F419 Anxiety disorder, unspecified: Secondary | ICD-10-CM | POA: Diagnosis not present

## 2014-04-12 DIAGNOSIS — F329 Major depressive disorder, single episode, unspecified: Secondary | ICD-10-CM | POA: Diagnosis not present

## 2014-04-23 DIAGNOSIS — H35342 Macular cyst, hole, or pseudohole, left eye: Secondary | ICD-10-CM | POA: Diagnosis not present

## 2014-04-23 DIAGNOSIS — H35372 Puckering of macula, left eye: Secondary | ICD-10-CM | POA: Diagnosis not present

## 2014-04-23 DIAGNOSIS — H43811 Vitreous degeneration, right eye: Secondary | ICD-10-CM | POA: Diagnosis not present

## 2014-04-26 DIAGNOSIS — H532 Diplopia: Secondary | ICD-10-CM | POA: Diagnosis not present

## 2014-04-26 DIAGNOSIS — H21562 Pupillary abnormality, left eye: Secondary | ICD-10-CM | POA: Diagnosis not present

## 2014-04-26 DIAGNOSIS — I638 Other cerebral infarction: Secondary | ICD-10-CM | POA: Diagnosis not present

## 2014-04-30 DIAGNOSIS — G20 Parkinson's disease: Secondary | ICD-10-CM | POA: Diagnosis not present

## 2014-04-30 DIAGNOSIS — Z789 Other specified health status: Secondary | ICD-10-CM | POA: Diagnosis not present

## 2014-04-30 DIAGNOSIS — Z7982 Long term (current) use of aspirin: Secondary | ICD-10-CM | POA: Diagnosis not present

## 2014-04-30 DIAGNOSIS — R29898 Other symptoms and signs involving the musculoskeletal system: Secondary | ICD-10-CM | POA: Diagnosis not present

## 2014-04-30 DIAGNOSIS — I672 Cerebral atherosclerosis: Secondary | ICD-10-CM | POA: Diagnosis not present

## 2014-05-09 DIAGNOSIS — R29898 Other symptoms and signs involving the musculoskeletal system: Secondary | ICD-10-CM | POA: Diagnosis not present

## 2014-06-05 DIAGNOSIS — R29898 Other symptoms and signs involving the musculoskeletal system: Secondary | ICD-10-CM | POA: Diagnosis not present

## 2014-08-12 DIAGNOSIS — M545 Low back pain: Secondary | ICD-10-CM | POA: Diagnosis not present

## 2014-09-04 DIAGNOSIS — G20 Parkinson's disease: Secondary | ICD-10-CM | POA: Diagnosis not present

## 2014-09-04 DIAGNOSIS — Z7982 Long term (current) use of aspirin: Secondary | ICD-10-CM | POA: Diagnosis not present

## 2014-09-17 ENCOUNTER — Encounter: Payer: Self-pay | Admitting: Physical Therapy

## 2014-09-17 ENCOUNTER — Ambulatory Visit: Payer: Medicare Other | Attending: Neurology | Admitting: Physical Therapy

## 2014-09-17 DIAGNOSIS — M545 Low back pain, unspecified: Secondary | ICD-10-CM

## 2014-09-17 DIAGNOSIS — R269 Unspecified abnormalities of gait and mobility: Secondary | ICD-10-CM | POA: Diagnosis not present

## 2014-09-17 DIAGNOSIS — R29898 Other symptoms and signs involving the musculoskeletal system: Secondary | ICD-10-CM

## 2014-09-17 DIAGNOSIS — M6289 Other specified disorders of muscle: Secondary | ICD-10-CM | POA: Insufficient documentation

## 2014-09-17 NOTE — Therapy (Signed)
[facility] [address]  [place], [place], [address] Phone: [phone]   Fax:  [phone]  Physical Therapy Evaluation  Patient Details  Name: Paul Ortiz MRN: [id number] Date of Birth: March 23, 1945 Referring Provider:  Eldridge Abrahams, MD  Encounter Date: 09/17/2014      PT End of Session - 09/17/14 0947    Visit Number 1   Number of Visits 12   Date for PT Re-Evaluation 10/29/14   PT Start Time 0940   PT Stop Time 1030   PT Time Calculation (min) 50 min      History reviewed. No pertinent past medical history.  Past Surgical History  Procedure Laterality Date  . Coronary artery bypass graft  2005    There were no vitals filed for this visit.  Visit Diagnosis:  Abnormality of gait - Plan: PT plan of care cert/re-cert  Bilateral low back pain without sciatica - Plan: PT plan of care cert/re-cert  Weakness of both hips - Plan: PT plan of care cert/re-cert      Subjective Assessment - 09/17/14 0946    Subjective pt sent to OPPT for balance / gait training due to diagnosis of Parkinson's Disease (diagnosed 2010).  Pt with secondary c/o LBP which he states limits his ability to exercise at times.   Currently in Pain? Yes   Pain Score 2    Pain Location Back   Pain Orientation Lower   Aggravating Factors  forward bending   Pain Relieving Factors supine lying, medication in the past, heat            [facility] PT Assessment - 09/17/14 0001    Assessment   Medical Diagnosis parkinson's disease   Onset Date/Surgical Date 11/01/08   Balance Screen   Has the patient fallen in the past 6 months No   Has the patient had a decrease in activity level because of a fear of falling?  No   Is the patient reluctant to leave their home because of a fear of falling?  No   Home Ecologist residence   Living Arrangements Spouse/significant other   Type of [place] to enter   Entrance Stairs-Number of Steps 1   [place] One level   Prior Delia Retired   Leisure enjoys regular gym based exercise (resistance training 2sets 10 on all machines), stationary bike at home 2x/wk   Observation/Other Assessments   Focus on Therapeutic Outcomes (FOTO)  34% limitation   Posture/Postural Control   Posture Comments slouched standing and seated with rounded shoulders and forward head   ROM / Strength   AROM / PROM / Strength Strength   Strength   Strength Assessment Site Hip   Right/Left Hip Right;Left   Right Hip Flexion 5/5   Right Hip Extension 4/5   Right Hip External Rotation  --  5/5   Right Hip Internal Rotation  --  5/5   Right Hip ABduction 4/5   Right Hip ADduction 5/5   Left Hip Flexion 4/5   Left Hip Extension 4/5   Left Hip External Rotation  --  5/5   Left Hip Internal Rotation  --  4/5   Left Hip ABduction 5/5   Left Hip ADduction 4-/5   Flexibility   Soft Tissue Assessment /Muscle Length --  very tight B HS, tight B RF   Balance   Balance  Assessed Yes   Standardized Balance Assessment   Standardized Balance Assessment Berg Balance Test   Berg Balance Test   Sit to Stand Able to stand without using hands and stabilize independently   Standing Unsupported Able to stand safely 2 minutes   Sitting with Back Unsupported but Feet Supported on Floor or Stool Able to sit safely and securely 2 minutes   Stand to Sit Sits safely with minimal use of hands   Transfers Able to transfer safely, minor use of hands   Standing Unsupported with Eyes Closed Able to stand 10 seconds safely   Standing Ubsupported with Feet Together Able to place feet together independently and stand 1 minute safely   From Standing, Reach Forward with Outstretched Arm Can reach confidently >25 cm (10")   From Standing Position, Pick up Object from Floor Able to pick up shoe safely and easily   From Standing Position, Turn to Look Behind  Over each Shoulder Looks behind one side only/other side shows less weight shift  unable to R   Turn 360 Degrees Able to turn 360 degrees safely but slowly   Standing Unsupported, Alternately Place Feet on Step/Stool Able to complete 4 steps without aid or supervision   Standing Unsupported, One Foot in Front Able to plae foot ahead of the other independently and hold 30 seconds   Standing on One Leg Tries to lift leg/unable to hold 3 seconds but remains standing independently   Total Score 47      TODAY'S TREATMENT TherEx - Bridge 10x Seated Hip ABD Black TB 10x Seated HS Stretch 20" each            PT Education - 09/17/14 1031    Education provided Yes   Education Details initial HEP   Person(s) Educated Patient   Methods Explanation;Demonstration;Handout   Comprehension Verbalized understanding;Returned demonstration          PT Short Term Goals - 09/17/14 1042    PT SHORT TERM GOAL #1   Title pt independent with initial HEP by 09/27/14   Status New           PT Long Term Goals - 09/17/14 1042    PT LONG TERM GOAL #1   Title pt scores 50/56 or better on Berg by 10/29/14   Status New   PT LONG TERM GOAL #2   Title B Hip MMT 4+/5 or better by 10/29/14   Status New   PT LONG TERM GOAL #3   Title pt able to exercise at gym without limitation by LBP by 10/29/14   Status New   PT LONG TERM GOAL #4   Title pt independent with appropriate gym-based HEP by 10/29/14   Status New               Plan - 09/17/14 1035    Clinical Impression Statement pt sent to OPPT due to gait/balance instability associated with Parkinson's Disease.  Pt with secondary c/o intermittent LBP which he states limits his ability to exercise when present. Significant findings with today's assessment include some B hip weakness (especially with Hip Ext and R hip ABD limited to 4/5), very tight B hamstrings, mild tight B RF, and Berg balance assessment score of 47/56 (indicates increased  fall risk).  Pt denies fall in past 6 months and reports intermittent use of SPC as walking aide.  Pt states he is able to perform all transfers independently at home and he was independent with transfers and bed  mobility today in clinic.  Due to pt's LBP interferring with ability to exercise and remain active this in addition to gait and balance will be addressed with our POC.   Pt will benefit from skilled therapeutic intervention in order to improve on the following deficits Abnormal gait;Decreased activity tolerance;Pain;Improper body mechanics;Impaired flexibility;Decreased balance;Decreased strength;Postural dysfunction   Rehab Potential Good   PT Frequency 2x / week   PT Duration 6 weeks   PT Treatment/Interventions Therapeutic exercise;Neuromuscular re-education;Therapeutic activities;Patient/family education;Taping;Manual techniques;Gait training;Functional mobility training   PT Next Visit Plan gait/balance training, HS and RF stretching, Hip strengthening, lumbar mobility and stability exercises   Consulted and Agree with Plan of Care Patient          G-Codes - September 24, 2014 1044    Functional Assessment Tool Used FOTO 34% limitation   Functional Limitation Mobility: Walking and moving around   Mobility: Walking and Moving Around Current Status (B5597) At least 20 percent but less than 40 percent impaired, limited or restricted   Mobility: Walking and Moving Around Goal Status (C1638) At least 20 percent but less than 40 percent impaired, limited or restricted       Problem List There are no active problems to display for this patient.   [facility] September 24, 2014, 10:48 AM  [facility] [address]  [place], [place], [address] Phone: [phone]   Fax:  [phone]

## 2014-09-20 ENCOUNTER — Ambulatory Visit: Payer: Medicare Other | Admitting: Rehabilitation

## 2014-09-20 DIAGNOSIS — M545 Low back pain, unspecified: Secondary | ICD-10-CM

## 2014-09-20 DIAGNOSIS — R269 Unspecified abnormalities of gait and mobility: Secondary | ICD-10-CM | POA: Diagnosis not present

## 2014-09-20 DIAGNOSIS — R29898 Other symptoms and signs involving the musculoskeletal system: Secondary | ICD-10-CM

## 2014-09-20 DIAGNOSIS — M6289 Other specified disorders of muscle: Secondary | ICD-10-CM | POA: Diagnosis not present

## 2014-09-20 NOTE — Therapy (Signed)
[facility] [address]  [place], [place], [address] Phone: [phone]   Fax:  [phone]  Physical Therapy Treatment  Patient Details  Name: Paul Ortiz MRN: [id number] Date of Birth: Oct 14, 1945 Referring Provider:  Eldridge Abrahams, MD  Encounter Date: 09/20/2014      PT End of Session - 09/20/14 0843    Visit Number 2   Number of Visits 12   Date for PT Re-Evaluation 10/29/14   PT Start Time 0841   PT Stop Time 0921   PT Time Calculation (min) 40 min      No past medical history on file.  Past Surgical History  Procedure Laterality Date  . Coronary artery bypass graft  2005    There were no vitals filed for this visit.  Visit Diagnosis:  Abnormality of gait  Bilateral low back pain without sciatica  Weakness of both hips      Subjective Assessment - 09/20/14 0843    Subjective Reports back is feeling fine this morning and has been doing HEP without problems. Walks into therapy this moring without SPC due it being in his other vehicle. States he feels comfortable walking on level ground without at times.    Currently in Pain? No/denies      TODAY'S TREATMENT TherEx - Rec Bike level 1x5'  Bridge 10x Hamstring, Piriformis, SKTC Stretch  Hooklying TrA Iso 10x5" Hooklying TrA + March 10x  Hooklying TrA + Hip Abd Black TB 10x Hooklying TrA + SLR 10x Prone Quad Stretch   Neuro - Narrow Standing on Blue Foam x30" then with head turns up/down, side/side 10x each no HHA on counter Hip Abduction on Blue Foam 10x with 1 HHA on counter Slow March on Blue Foam 10x with 1 HHA on counter Heel/Toe Raise on Blue Foam 10x no HHA on counter      PT Short Term Goals - 09/20/14 0846    PT SHORT TERM GOAL #1   Title pt independent with initial HEP by 09/27/14   Status On-going           PT Long Term Goals - 09/20/14 0846    PT LONG TERM GOAL #1   Title pt scores 50/56 or better on Berg by  10/29/14   Status On-going   PT LONG TERM GOAL #2   Title B Hip MMT 4+/5 or better by 10/29/14   Status On-going   PT LONG TERM GOAL #3   Title pt able to exercise at gym without limitation by LBP by 10/29/14   Status On-going   PT LONG TERM GOAL #4   Title pt independent with appropriate gym-based HEP by 10/29/14   Status On-going               Plan - 09/20/14 0923    Clinical Impression Statement Good tolerance to exercises with no LOB during balance activities but pt was very hesitant with toe raises on blue foam. Pt reported fatigue with march and SLR exercises while on plinth. Began with core stabilization exercises then progressed to balance with good tolerance and no major fatigue reported per pt.    PT Next Visit Plan gait/balance training, HS and RF stretching, Hip strengthening, lumbar mobility and stability exercises   Consulted and Agree with Plan of Care Patient        Problem List There are no active problems to display for this patient.   Barbette Hair, PTA 09/20/2014, 9:25  AM  [facility] [address]  [place], [place], [address] Phone: [phone]   Fax:  [phone]

## 2014-09-23 ENCOUNTER — Ambulatory Visit: Payer: Medicare Other | Admitting: Rehabilitation

## 2014-09-23 DIAGNOSIS — M545 Low back pain, unspecified: Secondary | ICD-10-CM

## 2014-09-23 DIAGNOSIS — M6289 Other specified disorders of muscle: Secondary | ICD-10-CM | POA: Diagnosis not present

## 2014-09-23 DIAGNOSIS — R29898 Other symptoms and signs involving the musculoskeletal system: Secondary | ICD-10-CM

## 2014-09-23 DIAGNOSIS — R269 Unspecified abnormalities of gait and mobility: Secondary | ICD-10-CM | POA: Diagnosis not present

## 2014-09-23 NOTE — Therapy (Signed)
[facility] [address]  [place], [place], [address] Phone: [phone]   Fax:  [phone]  Physical Therapy Treatment  Patient Details  Name: Paul Ortiz MRN: [id number] Date of Birth: Jan 01, 1946 Referring Provider:  Eldridge Abrahams, MD  Encounter Date: 09/23/2014      PT End of Session - 09/23/14 0932    Visit Number 3   Number of Visits 12   Date for PT Re-Evaluation 10/29/14   PT Start Time 0932   PT Stop Time 1012   PT Time Calculation (min) 40 min      No past medical history on file.  Past Surgical History  Procedure Laterality Date  . Coronary artery bypass graft  2005    There were no vitals filed for this visit.  Visit Diagnosis:  Abnormality of gait  Bilateral low back pain without sciatica  Weakness of both hips      Subjective Assessment - 09/23/14 0935    Subjective Pt noted an increase in Rt hip pain after last time that lasted a few hours then went away and pt reports he felt better after that. Noted pain when he first woke up this morning but is feeling better now after performing his HEP.    Currently in Pain? No/denies      TODAY'S TREATMENT TherEx - Rec Bike level 1x5'  Bridge 15x Hamstring, Piriformis, SKTC Stretch  Rt ITB Stretch Hooklying TrA + SLR 10x Prone Quad Stretch then Prone RF Stretch Hooklying TrA + Hip Abd Black TB 12x Side-stepping at counter with green TB at ankles 74ft x 4 each way  Neuro - Toe Taps to 8" Step from Blue Foam 10x with 1 HHA on counter as needed Slow March on Blue Foam 10x with 1 HHA on counter Heel/Toe Raise on Blue Foam 10x no HHA on counter Squats on Blue Foam 10x fingertips on counter (verbal and visual cues for proper motion)       PT Short Term Goals - 09/20/14 0846    PT SHORT TERM GOAL #1   Title pt independent with initial HEP by 09/27/14   Status On-going           PT Long Term Goals - 09/20/14 0846    PT LONG  TERM GOAL #1   Title pt scores 50/56 or better on Berg by 10/29/14   Status On-going   PT LONG TERM GOAL #2   Title B Hip MMT 4+/5 or better by 10/29/14   Status On-going   PT LONG TERM GOAL #3   Title pt able to exercise at gym without limitation by LBP by 10/29/14   Status On-going   PT LONG TERM GOAL #4   Title pt independent with appropriate gym-based HEP by 10/29/14   Status On-going               Plan - 09/23/14 1011    Clinical Impression Statement Pt noted slight annoying Rt hip/LBP at the end of treatment but reports this typically goes away after he walks a little. Needed verbal and visual cues for proper squat mechanics and some reinforcement needed. Attempted supine ITB stretch to assist with Rt hip pain today following SLR exercise. Good tolerance with increased reps today.   PT Next Visit Plan gait/balance training, HS and RF stretching, Hip strengthening, lumbar mobility and stability exercises   Consulted and Agree with Plan of Care Patient  Problem List There are no active problems to display for this patient.   [address], [place] 09/23/2014, 10:16 AM  [facility] [address]  [place], [place], [address] Phone: [phone]   Fax:  [phone]

## 2014-09-25 ENCOUNTER — Ambulatory Visit: Payer: Medicare Other | Admitting: Rehabilitation

## 2014-09-25 DIAGNOSIS — R269 Unspecified abnormalities of gait and mobility: Secondary | ICD-10-CM

## 2014-09-25 DIAGNOSIS — M545 Low back pain, unspecified: Secondary | ICD-10-CM

## 2014-09-25 DIAGNOSIS — R29898 Other symptoms and signs involving the musculoskeletal system: Secondary | ICD-10-CM

## 2014-09-25 DIAGNOSIS — M6289 Other specified disorders of muscle: Secondary | ICD-10-CM | POA: Diagnosis not present

## 2014-09-25 NOTE — Therapy (Signed)
[facility] [address]  [place], [place], [address] Phone: [phone]   Fax:  [phone]  Physical Therapy Treatment  Patient Details  Name: Paul Ortiz MRN: [id number] Date of Birth: [birth year] Referring Provider:  Eldridge Abrahams, MD  Encounter Date: 09/25/2014      PT End of Session - 09/25/14 1015    Visit Number 4   Number of Visits 12   Date for PT Re-Evaluation 10/29/14   PT Start Time 5638   PT Stop Time 1053   PT Time Calculation (min) 38 min      No past medical history on file.  Past Surgical History  Procedure Laterality Date  . Coronary artery bypass graft  2005    There were no vitals filed for this visit.  Visit Diagnosis:  Abnormality of gait  Bilateral low back pain without sciatica  Weakness of both hips      Subjective Assessment - 09/25/14 1017    Subjective Reports he wasn't quiet as sore after last time and the soreness went away quicker. Went to get out of his car last night and noted a pop in his hip. States his hip has been sore since then.    Currently in Pain? Yes   Pain Score --  5-6/10   Pain Location Hip   Pain Orientation Right   Pain Descriptors / Indicators Sore      TODAY'S TREATMENT TherEx - Rec Bike level 2x5'  Hamstring, Piriformis, SKTC Stretch  Rt ITB Stretch Bridge 15x Prone Quad Stretch then Prone RF Stretch Prone Alt SLR 10x Side-stepping at counter with green TB at ankles 69ft x 3 each way  Neuro - Tandem Standing on Blue Balance Beam 3x15-20" holds with 2 pole assist as needed (CGA) Side-stepping on Blue Balance Beam x4 laps (down and back = 1) (close SBA)  BOSU (up) Step ups 10x3" 2 pole assist (CGA)        PT Short Term Goals - 09/20/14 0846    PT SHORT TERM GOAL #1   Title pt independent with initial HEP by 09/27/14   Status On-going           PT Long Term Goals - 09/20/14 0846    PT LONG TERM GOAL #1   Title pt  scores 50/56 or better on Berg by 10/29/14   Status On-going   PT LONG TERM GOAL #2   Title B Hip MMT 4+/5 or better by 10/29/14   Status On-going   PT LONG TERM GOAL #3   Title pt able to exercise at gym without limitation by LBP by 10/29/14   Status On-going   PT LONG TERM GOAL #4   Title pt independent with appropriate gym-based HEP by 10/29/14   Status On-going               Plan - 09/25/14 1053    Clinical Impression Statement Pt very hesitant with BOSU and balance beam activities but able to complete with CGA to SBA. Still very tight hamstrings noted today and pt reported hip pain upon coming into therapy today. No complaint of pain with exercises but did noted slight hip pain with prone Rt RF stretch. Decreased the stretch a little and pt able to tolerate well. Continued focus on stretching and lumbar stability exercises.    PT Next Visit Plan gait/balance training, HS and RF stretching, Hip strengthening, lumbar mobility and stability exercises   Consulted and  Agree with Plan of Care Patient        Problem List There are no active problems to display for this patient.   [place], [place] 09/25/2014, 10:57 AM  [facility] [address]  [place], [place], [address] Phone: [phone]   Fax:  [phone]

## 2014-09-30 ENCOUNTER — Ambulatory Visit: Payer: Medicare Other | Admitting: Physical Therapy

## 2014-09-30 DIAGNOSIS — R29898 Other symptoms and signs involving the musculoskeletal system: Secondary | ICD-10-CM

## 2014-09-30 DIAGNOSIS — M545 Low back pain, unspecified: Secondary | ICD-10-CM

## 2014-09-30 DIAGNOSIS — R269 Unspecified abnormalities of gait and mobility: Secondary | ICD-10-CM | POA: Diagnosis not present

## 2014-09-30 DIAGNOSIS — M6289 Other specified disorders of muscle: Secondary | ICD-10-CM | POA: Diagnosis not present

## 2014-09-30 NOTE — Therapy (Signed)
[facility] [address]  [place], [place], [address] Phone: [phone]   Fax:  [phone]  Physical Therapy Treatment  Patient Details  Name: Paul Ortiz MRN: [id number] Date of Birth: 18-Dec-1945 Referring Provider:  Eldridge Abrahams, MD  Encounter Date: 09/30/2014      PT End of Session - 09/30/14 4270    Visit Number 5   Number of Visits 12   Date for PT Re-Evaluation 10/29/14   PT Start Time 0935   PT Stop Time 1022   PT Time Calculation (min) 47 min      No past medical history on file.  Past Surgical History  Procedure Laterality Date  . Coronary artery bypass graft  2005    There were no vitals filed for this visit.  Visit Diagnosis:  Abnormality of gait  Bilateral low back pain without sciatica  Weakness of both hips      Subjective Assessment - 09/30/14 0936    Subjective states continues to note R lower back/hip pain. States pain wraps from R lower back along R IC.  Greatest pain noted with walking. Denies n/t.  States HS stretches in HEP seem to help.   Currently in Pain? Yes   Pain Score 5    Pain Location Back   Pain Orientation Right   Pain Radiating Towards R lower back extending into hip/buttock   Aggravating Factors  prolonged walking, trunk flexion          TODAY'S TREATMENT TherEx - NuStep lvl 4, 3' Stretch B HS and Piriformis Bridge on Heels 10x2" Side-Lying Clam Blue TB 12x each Side Bridge 10x each TRX DL Squat 10x (good depth, notes more fatigue on R vs L) TRX ALT step back lunge 5x each Standing on Foam Pad toe tapping to 9" stool 10x each with SBA and no need for pole A Standing on Foam Pad toe tapping to beach ball 5x each with single Pole A (greater difficulty tapping with L LE due to R LE fatigue) B Heel Raise at UBE   Manual - light STM and strumming to R ITB in L side-lying (pt reports R LE feels better following this).                 PT Short Term Goals - 09/20/14 0846    PT SHORT TERM GOAL #1   Title pt independent with initial HEP by 09/27/14   Status On-going           PT Long Term Goals - 09/20/14 0846    PT LONG TERM GOAL #1   Title pt scores 50/56 or better on Berg by 10/29/14   Status On-going   PT LONG TERM GOAL #2   Title B Hip MMT 4+/5 or better by 10/29/14   Status On-going   PT LONG TERM GOAL #3   Title pt able to exercise at gym without limitation by LBP by 10/29/14   Status On-going   PT LONG TERM GOAL #4   Title pt independent with appropriate gym-based HEP by 10/29/14   Status On-going               Plan - 09/30/14 1010    Clinical Impression Statement pt with significant R LE fatigue within a few exercises in clinic creating greater difficulty with balance training activities. Performed STM and gentle strumming to R ITB and note significant tightness and tenderness to this area.   PT Next  Visit Plan gait/balance training, HS and RF stretching, Hip strengthening, lumbar mobility and stability exercises        Problem List There are no active problems to display for this patient.   Elim Economou PT, OCS 09/30/2014, 10:30 AM  [facility] [address]  [place], [place], [address] Phone: [phone]   Fax:  [phone]

## 2014-10-03 ENCOUNTER — Ambulatory Visit: Payer: Medicare Other | Attending: Neurology | Admitting: Physical Therapy

## 2014-10-03 DIAGNOSIS — M545 Low back pain, unspecified: Secondary | ICD-10-CM

## 2014-10-03 DIAGNOSIS — R269 Unspecified abnormalities of gait and mobility: Secondary | ICD-10-CM | POA: Diagnosis not present

## 2014-10-03 DIAGNOSIS — R29898 Other symptoms and signs involving the musculoskeletal system: Secondary | ICD-10-CM

## 2014-10-03 DIAGNOSIS — M6289 Other specified disorders of muscle: Secondary | ICD-10-CM | POA: Insufficient documentation

## 2014-10-03 NOTE — Therapy (Signed)
[facility] [address]  [place], [place], [address] Phone: [phone]   Fax:  [phone]  Physical Therapy Treatment  Patient Details  Name: TEODORO JEFFREYS MRN: [id number] Date of Birth: 07/27/1945 Referring Provider:  Eldridge Abrahams, MD  Encounter Date: 10/03/2014      PT End of Session - 10/03/14 0944    Visit Number 6   Number of Visits 12   Date for PT Re-Evaluation 10/29/14   PT Start Time 0938   PT Stop Time 1019   PT Time Calculation (min) 41 min      No past medical history on file.  Past Surgical History  Procedure Laterality Date  . Coronary artery bypass graft  2005    There were no vitals filed for this visit.  Visit Diagnosis:  Abnormality of gait  Bilateral low back pain without sciatica  Weakness of both hips      Subjective Assessment - 10/03/14 0941    Subjective states back pain seems to be improving; notes some soreness in R thigh today.   Currently in Pain? Yes   Pain Score 4   3-4/10   Pain Location Back   Pain Orientation Right        TODAY'S TREATMENT TherEx -  Bridge on Heels 12x5" Partial Curl-up 15x Stretch B HS, Piriformis, ITB  Manual - light STM and strumming to R ITB in L side-lying  Side-Lying Clam Blue TB 12x each Side Bridge 10x each Fitter BW Lunge 2 Blue 12x each with B pole A (only one foot on Fitter, other remained on floor) Standing on Foam Pad toe tapping to beach ball 10x each with single Pole A (still with greater difficulty tapping with L LE but much better overall today) Foam Beam side stepping 3 laps with close SBA Foam Beam FW tandem walking 2 laps with close SBA TRX DL Squat with heel raise 15x                 PT Short Term Goals - 09/20/14 0846    PT SHORT TERM GOAL #1   Title pt independent with initial HEP by 09/27/14   Status On-going           PT Long Term Goals - 09/20/14 0846    PT LONG TERM GOAL #1   Title pt scores 50/56 or better on Berg by 10/29/14   Status On-going   PT LONG TERM GOAL #2   Title B Hip MMT 4+/5 or better by 10/29/14   Status On-going   PT LONG TERM GOAL #3   Title pt able to exercise at gym without limitation by LBP by 10/29/14   Status On-going   PT LONG TERM GOAL #4   Title pt independent with appropriate gym-based HEP by 10/29/14   Status On-going               Plan - 10/03/14 1022    Clinical Impression Statement seems to be progressing quite well with current POC   PT Next Visit Plan gait/balance training, HS and RF stretching, Hip strengthening, lumbar mobility and stability exercises   Consulted and Agree with Plan of Care Patient        Problem List There are no active problems to display for this patient.   Treyson Axel PT, OCS 10/03/2014, 10:23 AM  [facility] [place], [place], [address] Phone:  [phone]   Fax:  [phone]

## 2014-10-09 ENCOUNTER — Ambulatory Visit: Payer: Medicare Other | Admitting: Rehabilitation

## 2014-10-09 DIAGNOSIS — M6289 Other specified disorders of muscle: Secondary | ICD-10-CM | POA: Diagnosis not present

## 2014-10-09 DIAGNOSIS — M545 Low back pain, unspecified: Secondary | ICD-10-CM

## 2014-10-09 DIAGNOSIS — R29898 Other symptoms and signs involving the musculoskeletal system: Secondary | ICD-10-CM

## 2014-10-09 DIAGNOSIS — R269 Unspecified abnormalities of gait and mobility: Secondary | ICD-10-CM | POA: Diagnosis not present

## 2014-10-09 NOTE — Therapy (Addendum)
[facility] [address]  [place], [place], [address] Phone: [phone]   Fax:  [phone]  Physical Therapy Treatment  Patient Details  Name: Paul Ortiz MRN: [id number] Date of Birth: [birth year] Referring Provider:  Eldridge Abrahams, MD  Encounter Date: 10/09/2014      PT End of Session - 10/09/14 0931    Visit Number 7   Number of Visits 12   Date for PT Re-Evaluation 10/29/14   PT Start Time 0931   PT Stop Time 1013   PT Time Calculation (min) 42 min      No past medical history on file.  Past Surgical History  Procedure Laterality Date  . Coronary artery bypass graft  2005    There were no vitals filed for this visit.  Visit Diagnosis:  Abnormality of gait  Bilateral low back pain without sciatica  Weakness of both hips      Subjective Assessment - 10/09/14 0933    Subjective Felt good after last time. Still has the pain that wraps around his Rt side starting in his back and coming around to his hip.    Currently in Pain? Yes   Pain Score 4    Pain Location --  Back/hip   Pain Orientation Right   Pain Descriptors / Indicators Sore      TODAY'S TREATMENT TherEx -  Rec Bike level 2x5' Bridge on Heels 15x5" Stretch B HS, Piriformis, ITB Partial Curl-up 15x TrA + March 15x  Manual - light STM and strumming to R ITB in L side-lying  Side-Lying Clam Blue TB 15x each Side-Lying Clam Abd Blue TB 10x each noted a slight pull on Rt side Foam Beam side stepping 4 laps with close SBA Foam Beam tandem stance 2x15-20" with close SBA TRX DL Squat 12x        PT Short Term Goals - 09/20/14 0846    PT SHORT TERM GOAL #1   Title pt independent with initial HEP by 09/27/14   Status On-going           PT Long Term Goals - 09/20/14 0846    PT LONG TERM GOAL #1   Title pt scores 50/56 or better on Berg by 10/29/14   Status On-going   PT LONG TERM GOAL #2   Title B Hip MMT 4+/5 or  better by 10/29/14   Status On-going   PT LONG TERM GOAL #3   Title pt able to exercise at gym without limitation by LBP by 10/29/14   Status On-going   PT LONG TERM GOAL #4   Title pt independent with appropriate gym-based HEP by 10/29/14   Status On-going               Plan - 10/09/14 1013    Clinical Impression Statement Pt noted fatigue toward the end of treatment today but able to complete all exercises well. Noted a pulling sensation with Rt clam Abd on the 10th rep.    PT Next Visit Plan gait/balance training, HS and RF stretching, Hip strengthening, lumbar mobility and stability exercises   Consulted and Agree with Plan of Care Patient        Problem List There are no active problems to display for this patient.   [address], [place] 10/09/2014, 10:15 AM  [facility] [address]  [place], [place], [address] Phone: [phone]   Fax:  [phone]      

## 2014-10-11 ENCOUNTER — Ambulatory Visit: Payer: Medicare Other | Admitting: Physical Therapy

## 2014-10-11 DIAGNOSIS — R29898 Other symptoms and signs involving the musculoskeletal system: Secondary | ICD-10-CM

## 2014-10-11 DIAGNOSIS — M545 Low back pain, unspecified: Secondary | ICD-10-CM

## 2014-10-11 DIAGNOSIS — M6289 Other specified disorders of muscle: Secondary | ICD-10-CM | POA: Diagnosis not present

## 2014-10-11 DIAGNOSIS — R269 Unspecified abnormalities of gait and mobility: Secondary | ICD-10-CM

## 2014-10-11 NOTE — Therapy (Signed)
[facility] [address]  [place], [place], [address] Phone: [phone]   Fax:  [phone]  Physical Therapy Treatment  Patient Details  Name: Paul Ortiz MRN: [id number] Date of Birth: Nov 28, 1945 Referring Provider:  Eldridge Abrahams, MD  Encounter Date: 10/11/2014      PT End of Session - 10/11/14 1017    Visit Number 8   Number of Visits 12   Date for PT Re-Evaluation 10/29/14   PT Start Time 0932   PT Stop Time 1016   PT Time Calculation (min) 44 min      No past medical history on file.  Past Surgical History  Procedure Laterality Date  . Coronary artery bypass graft  2005    There were no vitals filed for this visit.  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   Currently in Pain? Yes   Pain Score 2    Pain Location --  Back/Buttock/Hip   Pain Orientation Right         TODAY'S TREATMENT TherEx -  Bridge on Heels 10x5" Bridge on Heels with Black TB at knees 10x5" Reverse Curl-up 15x Diagonal Partial Curl-up 10x each Stretch B HS, Piriformis, ITB Side-Lying Clam Black TB 12x each  Manual - prone in mod thomas contract/relax knee flexion followed by grade 4 PA mobes B hips into extension  TRX DL Squat with heel raise and glute squeeze 15x TRX hip hinge into Y on toes 10x (difficult mechanics at top of motion) BOSU Step-up (down) with TRX A 6x each BOSU Mini-Squat with TRX A and CGA 8x Foam Pad toe tapping to 8" step 10x with close SBA Foam Pad EC static balance with pertubations x60"                          PT Short Term Goals - 09/20/14 0846    PT SHORT TERM GOAL #1   Title pt independent with initial HEP by 09/27/14   Status On-going           PT Long Term Goals - 09/20/14 0846    PT LONG TERM GOAL #1   Title pt scores 50/56 or better on Berg by 10/29/14   Status On-going   PT LONG TERM GOAL #2   Title B Hip MMT 4+/5 or better by 10/29/14   Status On-going   PT LONG TERM GOAL #3   Title pt able to exercise at gym without limitation by LBP by 10/29/14   Status On-going   PT LONG TERM GOAL #4   Title pt independent with appropriate gym-based HEP by 10/29/14   Status On-going               Plan - 10/11/14 1035    Clinical Impression Statement very tight hip flexors and RF addressed today with good benefit (after pt mentioning difficulty with achieving tall kneeling position at home yesterday).  Pt good performance but continues to require close SBA to CGA with balance training on noncompliant surfaces.   PT Next Visit Plan re-assess [name] soon,    Consulted and Agree with Plan of Care Patient  Problem List There are no active problems to display for this patient.   Khyrie Masi PT, OCS 10/11/2014, 10:42 AM  [facility] [address]  [place], [place], [address] Phone: [phone]   Fax:  [phone]

## 2014-10-14 ENCOUNTER — Ambulatory Visit: Payer: Medicare Other | Admitting: Rehabilitation

## 2014-10-14 DIAGNOSIS — Z23 Encounter for immunization: Secondary | ICD-10-CM | POA: Diagnosis not present

## 2014-10-14 DIAGNOSIS — F419 Anxiety disorder, unspecified: Secondary | ICD-10-CM | POA: Diagnosis not present

## 2014-10-14 DIAGNOSIS — G20 Parkinson's disease: Secondary | ICD-10-CM | POA: Diagnosis not present

## 2014-10-14 DIAGNOSIS — F329 Major depressive disorder, single episode, unspecified: Secondary | ICD-10-CM | POA: Diagnosis not present

## 2014-10-14 DIAGNOSIS — E782 Mixed hyperlipidemia: Secondary | ICD-10-CM | POA: Diagnosis not present

## 2014-10-14 DIAGNOSIS — I2510 Atherosclerotic heart disease of native coronary artery without angina pectoris: Secondary | ICD-10-CM | POA: Diagnosis not present

## 2014-10-14 DIAGNOSIS — J300 Vasomotor rhinitis: Secondary | ICD-10-CM | POA: Diagnosis not present

## 2014-10-14 DIAGNOSIS — I10 Essential (primary) hypertension: Secondary | ICD-10-CM | POA: Diagnosis not present

## 2014-10-15 ENCOUNTER — Ambulatory Visit: Payer: Medicare Other | Admitting: Physical Therapy

## 2014-10-15 DIAGNOSIS — M545 Low back pain, unspecified: Secondary | ICD-10-CM

## 2014-10-15 DIAGNOSIS — M6289 Other specified disorders of muscle: Secondary | ICD-10-CM | POA: Diagnosis not present

## 2014-10-15 DIAGNOSIS — R269 Unspecified abnormalities of gait and mobility: Secondary | ICD-10-CM

## 2014-10-15 DIAGNOSIS — R29898 Other symptoms and signs involving the musculoskeletal system: Secondary | ICD-10-CM

## 2014-10-15 NOTE — Therapy (Signed)
[facility] [address]  [place], [place], [address] Phone: [phone]   Fax:  [phone]  Physical Therapy Treatment  Patient Details  Name: DEJA KAIGLER MRN: [id number] Date of Birth: 1945-07-23 Referring Provider:  Eldridge Abrahams, MD  Encounter Date: 10/15/2014      PT End of Session - 10/15/14 0850    Visit Number 9   Number of Visits 12   Date for PT Re-Evaluation 10/29/14   PT Start Time 0848   PT Stop Time 0942   PT Time Calculation (min) 54 min      No past medical history on file.  Past Surgical History  Procedure Laterality Date  . Coronary artery bypass graft  2005    There were no vitals filed for this visit.  Visit Diagnosis:  Abnormality of gait  Bilateral low back pain without sciatica  Weakness of both hips      Subjective Assessment - 10/15/14 0850    Subjective Pt states is feeling a little sluggish today and notes 2/10 pain to R lower back/hip area.  He states pain this area generally runs 0-2/10 lately.  He states PT session last time seemed to help him stand up straighter.   Currently in Pain? Yes   Pain Score 2    Pain Location --  R lower back/buttock/hip   Pain Orientation Right        TODAY'S TREATMENT TherEx - NuStep lvl 5, 3' Bridge on Heels 10x5" Stretch B HS, Piriformis, ITB Manual - prone in mod thomas contract/relax knee flexion followed by grade 4 PA mobes B hips into extension Quadruplex 10x Diagonal crunches 2x10 TRX DL Squat with heel raise and glute squeeze 15x  Neuro - 6" foam roller FW stepover 5 laps with 2 rollers then side to side stepover 3x each direction Toe tapping beach ball while standing on Foam Pad  2x10 with SBA to CGA (2nd set performed with SBA and excellent performance) Minitramp heel/toe with light single hand on bar 15x Minitramp march with light single hand assist 10x each March in place with EC 2x30" (1st set moves FW 24",  2nd set FW 10-12")                         PT Education - 10/15/14 0942    Education provided Yes   Education Details HEP Update   Person(s) Educated Patient   Methods Demonstration;Explanation;Handout   Comprehension Verbalized understanding;Returned demonstration          PT Short Term Goals - 10/15/14 1048    PT SHORT TERM GOAL #1   Title pt independent with initial HEP by 09/27/14   Status Achieved           PT Long Term Goals - 09/20/14 0846    PT LONG TERM GOAL #1   Title pt scores 50/56 or better on Berg by 10/29/14   Status On-going   PT LONG TERM GOAL #2   Title B Hip MMT 4+/5 or better by 10/29/14   Status On-going   PT LONG TERM GOAL #3   Title pt able to exercise at gym without limitation by LBP by 10/29/14   Status On-going   PT LONG TERM GOAL #4   Title pt independent with appropriate gym-based HEP by 10/29/14   Status On-going               Plan - 10/15/14  1046    Clinical Impression Statement performing well and displayed very good improvement in balance training activities today.  Progressed HEP.   PT Next Visit Plan re-assess [name] soon, continue hip flexor and RF stretching, continue hip strengthening and lumbopelvic stability; progress gait/balance   Consulted and Agree with Plan of Care Patient        Problem List There are no active problems to display for this patient.   Gisell Buehrle PT, OCS 10/15/2014, 11:00 AM  [facility] [address]  [place], [place], [address] Phone: [phone]   Fax:  [phone]

## 2014-10-17 ENCOUNTER — Ambulatory Visit: Payer: Medicare Other | Admitting: Physical Therapy

## 2014-10-17 DIAGNOSIS — M545 Low back pain, unspecified: Secondary | ICD-10-CM

## 2014-10-17 DIAGNOSIS — M6289 Other specified disorders of muscle: Secondary | ICD-10-CM | POA: Diagnosis not present

## 2014-10-17 DIAGNOSIS — R29898 Other symptoms and signs involving the musculoskeletal system: Secondary | ICD-10-CM

## 2014-10-17 DIAGNOSIS — R269 Unspecified abnormalities of gait and mobility: Secondary | ICD-10-CM | POA: Diagnosis not present

## 2014-10-17 NOTE — Therapy (Signed)
[facility] [address]  [place], [place], [address] Phone: [phone]   Fax:  [phone]  Physical Therapy Treatment  Patient Details  Name: Paul Ortiz MRN: [id number] Date of Birth: March 07, 1945 Referring Provider:  Eldridge Abrahams, MD  Encounter Date: 2014/11/11      PT End of Session - 11-Nov-2014 0935    Visit Number 10   Number of Visits 12   Date for PT Re-Evaluation 10/29/14   PT Start Time 0933   PT Stop Time 1020   PT Time Calculation (min) 47 min      No past medical history on file.  Past Surgical History  Procedure Laterality Date  . Coronary artery bypass graft  2005    There were no vitals filed for this visit.  Visit Diagnosis:  Abnormality of gait  Bilateral low back pain without sciatica  Weakness of both hips      Subjective Assessment - November 11, 2014 0936    Subjective pt reports 2/10 pain to R lower back/hip again this AM.   Currently in Pain? Yes   Pain Score 2    Pain Location Back  lower back/buttock/hip   Pain Orientation Right        TODAY'S TREATMENT TherEx - NuStep lvl 5, 3' TRX DL Squat with heel raise and glute squeeze 20x Standing Quad Stretches 2x20" each Kneeling Hip flexor stretch 2x20" each TRX BW lunge with 9" step 10x each Fitter BW Lunge 1 Black + 1 Blue 15x with B Pole A SLS on blue foam disc Hip ABD with Green TB at ankles and TRX A 15x each SLS on blue foam disc Hip Ext   Neuro - foam beam f/b heel/toe 3 down/back (6 laps) Foam beam side stepping with crossover to front 2 laps each direction Foam Pad SLS with contra toe tap assist 2x30" each              PT Short Term Goals - 10/15/14 1048    PT SHORT TERM GOAL #1   Title pt independent with initial HEP by 09/27/14   Status Achieved           PT Long Term Goals - 09/20/14 0846    PT LONG TERM GOAL #1   Title pt scores 50/56 or better on Berg by 10/29/14   Status On-going   PT LONG TERM GOAL #2   Title B Hip MMT 4+/5 or better by 10/29/14   Status On-going   PT LONG TERM GOAL #3   Title pt able to exercise at gym without limitation by LBP by 10/29/14   Status On-going   PT LONG TERM GOAL #4   Title pt independent with appropriate gym-based HEP by 10/29/14   Status On-going               Plan - 2014-11-11 1113    Clinical Impression Statement FOTO reveals slight improvement (2 points, 4 points predicted).  Pt highly motivated and is performing HEP.  Given Parkinson's diagnosis he has made good progress regarding balance.  He notes intermittent difficulty initiating movements at times but once he gets going he typically performs well and safely.   PT Next Visit Plan re-assess Paul Ortiz soon, continue hip flexor and RF stretching, continue hip strengthening and lumbopelvic stability; progress gait/balance   Consulted and Agree with Plan of Care Patient          G-Codes - November 11, 2014 7017  Functional Assessment Tool Used FOTO 32% limitation   Functional Limitation Mobility: Walking and moving around   Mobility: Walking and Moving Around Current Status [phone]) At least 20 percent but less than 40 percent impaired, limited or restricted   Mobility: Walking and Moving Around Goal Status [phone]) At least 20 percent but less than 40 percent impaired, limited or restricted      Problem List There are no active problems to display for this patient.   Kirke Breach PT, OCS 10/17/2014, 11:18 AM  [facility] [address]  [place], [place], [address] Phone: [phone]   Fax:  [phone]

## 2014-10-21 ENCOUNTER — Ambulatory Visit: Payer: Medicare Other | Admitting: Rehabilitation

## 2014-10-21 DIAGNOSIS — I2510 Atherosclerotic heart disease of native coronary artery without angina pectoris: Secondary | ICD-10-CM | POA: Insufficient documentation

## 2014-10-21 DIAGNOSIS — Z951 Presence of aortocoronary bypass graft: Secondary | ICD-10-CM | POA: Diagnosis not present

## 2014-10-21 DIAGNOSIS — Z6824 Body mass index (BMI) 24.0-24.9, adult: Secondary | ICD-10-CM | POA: Diagnosis not present

## 2014-10-21 DIAGNOSIS — R42 Dizziness and giddiness: Secondary | ICD-10-CM | POA: Diagnosis not present

## 2014-10-24 ENCOUNTER — Ambulatory Visit: Payer: Medicare Other | Admitting: Physical Therapy

## 2014-10-24 DIAGNOSIS — M545 Low back pain, unspecified: Secondary | ICD-10-CM

## 2014-10-24 DIAGNOSIS — R269 Unspecified abnormalities of gait and mobility: Secondary | ICD-10-CM

## 2014-10-24 DIAGNOSIS — R29898 Other symptoms and signs involving the musculoskeletal system: Secondary | ICD-10-CM

## 2014-10-24 DIAGNOSIS — M6289 Other specified disorders of muscle: Secondary | ICD-10-CM | POA: Diagnosis not present

## 2014-10-24 NOTE — Therapy (Signed)
[facility] [address]  [place], [place], [address] Phone: [phone]   Fax:  [phone]  Physical Therapy Treatment  Patient Details  Name: Paul Ortiz MRN: [id number] Date of Birth: 04/04/1945 Referring Provider:  Eldridge Abrahams, MD  Encounter Date: 10/24/2014      PT End of Session - 10/24/14 0942    Visit Number 11   Number of Visits 12   Date for PT Re-Evaluation 10/29/14   PT Start Time 0938   PT Stop Time 1020   PT Time Calculation (min) 42 min      No past medical history on file.  Past Surgical History  Procedure Laterality Date  . Coronary artery bypass graft  2005    There were no vitals filed for this visit.  Visit Diagnosis:  Abnormality of gait  Bilateral low back pain without sciatica  Weakness of both hips          [facility] PT Assessment - 10/24/14 0001    Berg Balance Test   Sit to Stand Able to stand without using hands and stabilize independently   Standing Unsupported Able to stand safely 2 minutes   Sitting with Back Unsupported but Feet Supported on Floor or Stool Able to sit safely and securely 2 minutes   Stand to Sit Sits safely with minimal use of hands   Transfers Able to transfer safely, minor use of hands   Standing Unsupported with Eyes Closed Able to stand 10 seconds safely   Standing Ubsupported with Feet Together Able to place feet together independently and stand 1 minute safely   From Standing, Reach Forward with Outstretched Arm Can reach confidently >25 cm (10")   From Standing Position, Pick up Object from Floor Able to pick up shoe safely and easily   From Standing Position, Turn to Look Behind Over each Shoulder Looks behind from both sides and weight shifts well   Turn 360 Degrees Able to turn 360 degrees safely one side only in 4 seconds or less  4" to R, 5" to L   Standing Unsupported, Alternately Place Feet on Step/Stool Able to complete 4  steps without aid or supervision   Standing Unsupported, One Foot in Front Able to plae foot ahead of the other independently and hold 30 seconds   Standing on One Leg Able to lift leg independently and hold 5-10 seconds   Total Score 51      TODAY'S TREATMENT TherEx - Bridge on Heels 10x3" SL Bridge 10x each Stretch B HS, RF, and Hip Flexors Standing toe tapping to 8" step 10x each B Heel Raise at UBE with light B hand A 20x Fitter BW Lunge 1 Black + 1 Blue 15x with B Pole A  Berg Assessment  Neuro - foam beam f/b heel/toe 3 down/back (6 laps) Foam beam side stepping with crossover to front 2 laps each direction SLS on blue foam disc Hip ABD and Ext with Green TB at ankles and single pole 10x each                         PT Short Term Goals - 10/15/14 1048    PT SHORT TERM GOAL #1   Title pt independent with initial HEP by 09/27/14   Status Achieved           PT Long Term Goals - 10/24/14 1057    PT LONG TERM GOAL #1  Title pt scores 50/56 or better on Berg by 10/29/14   Status Achieved   PT LONG TERM GOAL #2   Title B Hip MMT 4+/5 or better by 10/29/14   Status Not Met   PT LONG TERM GOAL #3   Title pt able to exercise at gym without limitation by LBP by 10/29/14   Status On-going   PT LONG TERM GOAL #4   Title pt independent with appropriate gym-based HEP by 10/29/14   Status On-going               Plan - 10/24/14 1053    Clinical Impression Statement good improvement with [name] and in general.  Pt has one visit remaining on POC so will re-assess strength and LTG status and determine plan going forward at that time.   PT Next Visit Plan re-eval for renew vs d/c   Consulted and Agree with Plan of Care Patient        Problem List There are no active problems to display for this patient.   HALL,RALPH PT, OCS 10/24/2014, 10:59 AM  [facility] [address]  [place], [place], [address] Phone: [phone]   Fax:  [phone]

## 2014-10-28 DIAGNOSIS — W57XXXA Bitten or stung by nonvenomous insect and other nonvenomous arthropods, initial encounter: Secondary | ICD-10-CM | POA: Diagnosis not present

## 2014-10-28 DIAGNOSIS — R21 Rash and other nonspecific skin eruption: Secondary | ICD-10-CM | POA: Diagnosis not present

## 2014-10-29 ENCOUNTER — Ambulatory Visit: Payer: Medicare Other | Admitting: Physical Therapy

## 2014-11-01 ENCOUNTER — Ambulatory Visit: Payer: Medicare Other | Admitting: Physical Therapy

## 2014-11-01 DIAGNOSIS — M545 Low back pain, unspecified: Secondary | ICD-10-CM

## 2014-11-01 DIAGNOSIS — R269 Unspecified abnormalities of gait and mobility: Secondary | ICD-10-CM

## 2014-11-01 DIAGNOSIS — R29898 Other symptoms and signs involving the musculoskeletal system: Secondary | ICD-10-CM

## 2014-11-01 DIAGNOSIS — M6289 Other specified disorders of muscle: Secondary | ICD-10-CM | POA: Diagnosis not present

## 2014-11-01 NOTE — Therapy (Addendum)
[facility] [address]  [place], [place], [address] Phone: [phone]   Fax:  [phone]  Physical Therapy Treatment  Patient Details  Name: Paul Ortiz MRN: [id number] Date of Birth: 06-30-1945 Referring Provider:  Eldridge Abrahams, MD  Encounter Date: 11/01/2014      PT End of Session - 11/01/14 0720    Visit Number 12   Number of Visits 12   PT Start Time 0719   PT Stop Time 0810   PT Time Calculation (min) 51 min      No past medical history on file.  Past Surgical History  Procedure Laterality Date  . Coronary artery bypass graft  2005    There were no vitals filed for this visit.  Visit Diagnosis:  Abnormality of gait  Bilateral low back pain without sciatica  Weakness of both hips      Subjective Assessment - 11/01/14 0720    Subjective States notes some R thigh soreness following activity (after walking 1 mile yesterday) stating this is common with activity.  States this pain can get up to 7-8/10.  This pain is noted to lateral R thigh and is described as an ache and is worsened with deep palpation.  States back feels fine this AM and no c/o pain.   Currently in Pain? Yes   Pain Score 7    Pain Location Leg  R lateral thigh   Pain Orientation Right;Lateral;Upper            [facility] PT Assessment - 11/01/14 0001    ROM / Strength   AROM / PROM / Strength Strength   Strength   Strength Assessment Site Hip   Right/Left Hip Right;Left   Right Hip Flexion 4+/5  notes mild R thigh pain   Right Hip Extension 4+/5  4/5 with knee flexed   Right Hip External Rotation  5/5   Right Hip Internal Rotation 5/5  mild lateral thigh pain   Right Hip ABduction 4/5  lateral thigh pain   Right Hip ADduction 5/5   Left Hip Flexion 4+/5   Left Hip Extension 4+/5  4/5 with knee flexed   Left Hip External Rotation 5/5   Left Hip Internal Rotation 4+/5   Left Hip ABduction 5/5   Left Hip  ADduction 4/5  no pain            TODAY'S TREATMENT Hip MMT assessment Stretch B HS, Piri Foam Roller to R ITB with HEP instruction Manual - Strumming and STM to R ITB      PT Education - 11/01/14 1035    Education provided Yes   Education Details ITB foam roller added to HEP; discussed gym exercises and advised to perform in 15-20 rep range, he asked about trunk rotation machine and I advised against performing   Person(s) Educated Patient   Methods Explanation;Demonstration;Handout   Comprehension Returned demonstration;Verbalized understanding          PT Short Term Goals - 10/15/14 1048    PT SHORT TERM GOAL #1   Title pt independent with initial HEP by 09/27/14   Status Achieved           PT Long Term Goals - 11/01/14 1037    PT LONG TERM GOAL #1   Title pt scores 50/56 or better on Berg   Status Achieved   PT LONG TERM GOAL #2   Title B Hip MMT 4+/5 or better  met all  planes other than R Hip ABD 4/5 with c/o pain and L Hip ADD - no pain   Status Partially Met   PT LONG TERM GOAL #3   Title pt able to exercise at gym without limitation by LBP  pt plans on returning to gym next week, should do fine - was able to tolerate exercises here without limitation by back pain   Status Partially Met   PT LONG TERM GOAL #4   Title pt independent with appropriate gym-based HEP   Status Achieved               Plan - 2014/11/09 1041    Clinical Impression Statement pt has progressed well with balance and exercise progression.  He reports increased lateral thigh pain today which seems ITB in nature so reviewed foam roller as well as soft tissue work to this area (would need assistance with this).  He performed well and states he feels comfortable taking care of these issues on his own for now.  He requested holding PT for now.  I advised him we can therefore place him on hold for 30 days and if he feels he needs further assistance or his pain increases that he can  contact us to return within those 30 days.   PT Next Visit Plan pt on hold until 12/01/14   Consulted and Agree with Plan of Care Patient          G-Codes - 2014-11-09 1104    Functional Assessment Tool Used FOTO 32% limitation   Functional Limitation Mobility: Walking and moving around   Mobility: Walking and Moving Around Current Status [phone]) At least 20 percent but less than 40 percent impaired, limited or restricted   Mobility: Walking and Moving Around Goal Status [phone])   Mobility: Walking and Moving Around Discharge Status      At least 20 percent but less than 40 percent impaired, limited or restricted  At least 20 percent but less than 40 percent impaired, limited or restricted      Problem List There are no active problems to display for this patient.   HALL,RALPH PT, OCS 11-09-2014, 11:06 AM  [facility] [address]  [place], [place], [address] Phone: [phone]   Fax:  [phone]    PHYSICAL THERAPY DISCHARGE SUMMARY  Visits from Start of Care: 12  Current functional level related to goals / functional outcomes: 2 of 4 goals met, remaining 2 partially met.  Mild weakness noted with R Hip ABD and L Hip ADD (both 4/5).  [name] was last seen on 11/09/2014 at which time he was placed on hold. He planned on returning to gym exercises at that time and was to contact PT if had any issues/concerns with this.  We have not heard from him since 11/09/14 and he is therefore being discharged.   Remaining deficits: Mild Hip weakness, intermittent LE pain   Education / Equipment: HEP  Plan: Patient agrees to discharge.  Patient goals were partially met. Patient is being discharged due to being pleased with the current functional level.  ?????       Leonette Most PT, OCS 12/10/2014 8:26 AM

## 2014-11-15 DIAGNOSIS — R42 Dizziness and giddiness: Secondary | ICD-10-CM | POA: Diagnosis not present

## 2014-11-18 DIAGNOSIS — H35372 Puckering of macula, left eye: Secondary | ICD-10-CM | POA: Diagnosis not present

## 2014-11-18 DIAGNOSIS — H532 Diplopia: Secondary | ICD-10-CM | POA: Diagnosis not present

## 2014-12-16 DIAGNOSIS — H3581 Retinal edema: Secondary | ICD-10-CM | POA: Diagnosis not present

## 2014-12-16 DIAGNOSIS — H43811 Vitreous degeneration, right eye: Secondary | ICD-10-CM | POA: Diagnosis not present

## 2014-12-16 DIAGNOSIS — H35372 Puckering of macula, left eye: Secondary | ICD-10-CM | POA: Diagnosis not present

## 2014-12-16 DIAGNOSIS — H35342 Macular cyst, hole, or pseudohole, left eye: Secondary | ICD-10-CM | POA: Diagnosis not present

## 2014-12-18 DIAGNOSIS — N419 Inflammatory disease of prostate, unspecified: Secondary | ICD-10-CM | POA: Diagnosis not present

## 2014-12-18 DIAGNOSIS — R350 Frequency of micturition: Secondary | ICD-10-CM | POA: Diagnosis not present

## 2015-01-02 DIAGNOSIS — H3581 Retinal edema: Secondary | ICD-10-CM | POA: Diagnosis not present

## 2015-01-02 DIAGNOSIS — H35372 Puckering of macula, left eye: Secondary | ICD-10-CM | POA: Diagnosis not present

## 2015-01-02 DIAGNOSIS — H35342 Macular cyst, hole, or pseudohole, left eye: Secondary | ICD-10-CM | POA: Diagnosis not present

## 2015-01-03 DIAGNOSIS — H35372 Puckering of macula, left eye: Secondary | ICD-10-CM | POA: Diagnosis not present

## 2015-01-10 DIAGNOSIS — H35372 Puckering of macula, left eye: Secondary | ICD-10-CM | POA: Diagnosis not present

## 2015-01-30 DIAGNOSIS — Z951 Presence of aortocoronary bypass graft: Secondary | ICD-10-CM | POA: Diagnosis not present

## 2015-01-30 DIAGNOSIS — R42 Dizziness and giddiness: Secondary | ICD-10-CM | POA: Diagnosis not present

## 2015-01-30 DIAGNOSIS — I2510 Atherosclerotic heart disease of native coronary artery without angina pectoris: Secondary | ICD-10-CM | POA: Diagnosis not present

## 2015-01-30 DIAGNOSIS — Z6841 Body Mass Index (BMI) 40.0 and over, adult: Secondary | ICD-10-CM | POA: Diagnosis not present

## 2015-02-14 DIAGNOSIS — H35372 Puckering of macula, left eye: Secondary | ICD-10-CM | POA: Diagnosis not present

## 2015-02-14 DIAGNOSIS — H3581 Retinal edema: Secondary | ICD-10-CM | POA: Diagnosis not present

## 2015-03-07 DIAGNOSIS — G20 Parkinson's disease: Secondary | ICD-10-CM | POA: Diagnosis not present

## 2015-03-17 DIAGNOSIS — H532 Diplopia: Secondary | ICD-10-CM | POA: Diagnosis not present

## 2015-04-14 DIAGNOSIS — G4733 Obstructive sleep apnea (adult) (pediatric): Secondary | ICD-10-CM | POA: Diagnosis not present

## 2015-04-14 DIAGNOSIS — N492 Inflammatory disorders of scrotum: Secondary | ICD-10-CM | POA: Diagnosis not present

## 2015-04-14 DIAGNOSIS — I10 Essential (primary) hypertension: Secondary | ICD-10-CM | POA: Diagnosis not present

## 2015-04-14 DIAGNOSIS — K439 Ventral hernia without obstruction or gangrene: Secondary | ICD-10-CM | POA: Diagnosis not present

## 2015-04-14 DIAGNOSIS — G20 Parkinson's disease: Secondary | ICD-10-CM | POA: Diagnosis not present

## 2015-04-14 DIAGNOSIS — F419 Anxiety disorder, unspecified: Secondary | ICD-10-CM | POA: Diagnosis not present

## 2015-04-14 DIAGNOSIS — E782 Mixed hyperlipidemia: Secondary | ICD-10-CM | POA: Diagnosis not present

## 2015-04-17 DIAGNOSIS — H5022 Vertical strabismus, left eye: Secondary | ICD-10-CM | POA: Diagnosis not present

## 2015-04-17 DIAGNOSIS — H5032 Intermittent alternating esotropia: Secondary | ICD-10-CM | POA: Diagnosis not present

## 2015-04-25 ENCOUNTER — Other Ambulatory Visit: Payer: Self-pay | Admitting: Family Medicine

## 2015-04-25 DIAGNOSIS — N492 Inflammatory disorders of scrotum: Secondary | ICD-10-CM

## 2015-05-01 ENCOUNTER — Ambulatory Visit
Admission: RE | Admit: 2015-05-01 | Discharge: 2015-05-01 | Disposition: A | Discharge status: Home / Self Care | Payer: Medicare Other | Source: Ambulatory Visit | Attending: Family Medicine | Admitting: Family Medicine

## 2015-05-01 DIAGNOSIS — N503 Cyst of epididymis: Secondary | ICD-10-CM | POA: Diagnosis not present

## 2015-05-01 DIAGNOSIS — N492 Inflammatory disorders of scrotum: Secondary | ICD-10-CM

## 2015-05-05 DIAGNOSIS — J300 Vasomotor rhinitis: Secondary | ICD-10-CM | POA: Diagnosis not present

## 2015-05-05 DIAGNOSIS — J0100 Acute maxillary sinusitis, unspecified: Secondary | ICD-10-CM | POA: Diagnosis not present

## 2015-06-25 DIAGNOSIS — G4733 Obstructive sleep apnea (adult) (pediatric): Secondary | ICD-10-CM | POA: Diagnosis not present

## 2015-08-01 DIAGNOSIS — W57XXXA Bitten or stung by nonvenomous insect and other nonvenomous arthropods, initial encounter: Secondary | ICD-10-CM | POA: Diagnosis not present

## 2015-08-01 DIAGNOSIS — S30861A Insect bite (nonvenomous) of abdominal wall, initial encounter: Secondary | ICD-10-CM | POA: Diagnosis not present

## 2015-08-01 DIAGNOSIS — S80261A Insect bite (nonvenomous), right knee, initial encounter: Secondary | ICD-10-CM | POA: Diagnosis not present

## 2015-08-25 DIAGNOSIS — R002 Palpitations: Secondary | ICD-10-CM | POA: Diagnosis not present

## 2015-08-25 DIAGNOSIS — I959 Hypotension, unspecified: Secondary | ICD-10-CM | POA: Diagnosis not present

## 2015-08-25 DIAGNOSIS — G20 Parkinson's disease: Secondary | ICD-10-CM | POA: Diagnosis not present

## 2015-08-25 DIAGNOSIS — F419 Anxiety disorder, unspecified: Secondary | ICD-10-CM | POA: Diagnosis not present

## 2015-09-15 DIAGNOSIS — H524 Presbyopia: Secondary | ICD-10-CM | POA: Diagnosis not present

## 2015-09-15 DIAGNOSIS — H532 Diplopia: Secondary | ICD-10-CM | POA: Diagnosis not present

## 2015-09-15 DIAGNOSIS — H35372 Puckering of macula, left eye: Secondary | ICD-10-CM | POA: Diagnosis not present

## 2015-09-24 DIAGNOSIS — G4737 Central sleep apnea in conditions classified elsewhere: Secondary | ICD-10-CM | POA: Diagnosis not present

## 2015-09-24 DIAGNOSIS — G4733 Obstructive sleep apnea (adult) (pediatric): Secondary | ICD-10-CM | POA: Diagnosis not present

## 2015-09-30 DIAGNOSIS — N410 Acute prostatitis: Secondary | ICD-10-CM | POA: Diagnosis not present

## 2015-09-30 DIAGNOSIS — E7800 Pure hypercholesterolemia, unspecified: Secondary | ICD-10-CM | POA: Diagnosis not present

## 2015-09-30 DIAGNOSIS — R300 Dysuria: Secondary | ICD-10-CM | POA: Diagnosis not present

## 2015-10-20 DIAGNOSIS — Z23 Encounter for immunization: Secondary | ICD-10-CM | POA: Diagnosis not present

## 2015-10-28 DIAGNOSIS — Z7982 Long term (current) use of aspirin: Secondary | ICD-10-CM | POA: Diagnosis not present

## 2015-10-28 DIAGNOSIS — G20 Parkinson's disease: Secondary | ICD-10-CM | POA: Diagnosis not present

## 2015-10-28 DIAGNOSIS — R1319 Other dysphagia: Secondary | ICD-10-CM | POA: Diagnosis not present

## 2015-11-25 DIAGNOSIS — R1319 Other dysphagia: Secondary | ICD-10-CM | POA: Diagnosis not present

## 2015-11-25 DIAGNOSIS — G20 Parkinson's disease: Secondary | ICD-10-CM | POA: Diagnosis not present

## 2015-12-15 DIAGNOSIS — N401 Enlarged prostate with lower urinary tract symptoms: Secondary | ICD-10-CM | POA: Diagnosis not present

## 2015-12-15 DIAGNOSIS — R350 Frequency of micturition: Secondary | ICD-10-CM | POA: Diagnosis not present

## 2015-12-31 DIAGNOSIS — R1312 Dysphagia, oropharyngeal phase: Secondary | ICD-10-CM | POA: Diagnosis not present

## 2016-01-05 DIAGNOSIS — R1312 Dysphagia, oropharyngeal phase: Secondary | ICD-10-CM | POA: Diagnosis not present

## 2016-02-03 DIAGNOSIS — N410 Acute prostatitis: Secondary | ICD-10-CM | POA: Diagnosis not present

## 2016-02-03 DIAGNOSIS — R3989 Other symptoms and signs involving the genitourinary system: Secondary | ICD-10-CM | POA: Diagnosis not present

## 2016-02-11 DIAGNOSIS — R351 Nocturia: Secondary | ICD-10-CM | POA: Diagnosis not present

## 2016-02-11 DIAGNOSIS — N401 Enlarged prostate with lower urinary tract symptoms: Secondary | ICD-10-CM | POA: Diagnosis not present

## 2016-02-11 DIAGNOSIS — N200 Calculus of kidney: Secondary | ICD-10-CM | POA: Diagnosis not present

## 2016-02-11 DIAGNOSIS — R310 Gross hematuria: Secondary | ICD-10-CM | POA: Diagnosis not present

## 2016-02-16 DIAGNOSIS — R42 Dizziness and giddiness: Secondary | ICD-10-CM | POA: Diagnosis not present

## 2016-02-16 DIAGNOSIS — Z6824 Body mass index (BMI) 24.0-24.9, adult: Secondary | ICD-10-CM | POA: Diagnosis not present

## 2016-02-16 DIAGNOSIS — I2510 Atherosclerotic heart disease of native coronary artery without angina pectoris: Secondary | ICD-10-CM | POA: Diagnosis not present

## 2016-02-16 DIAGNOSIS — Z951 Presence of aortocoronary bypass graft: Secondary | ICD-10-CM | POA: Diagnosis not present

## 2016-02-24 DIAGNOSIS — R42 Dizziness and giddiness: Secondary | ICD-10-CM | POA: Diagnosis not present

## 2016-02-24 DIAGNOSIS — R002 Palpitations: Secondary | ICD-10-CM | POA: Diagnosis not present

## 2016-02-25 DIAGNOSIS — Z131 Encounter for screening for diabetes mellitus: Secondary | ICD-10-CM | POA: Diagnosis not present

## 2016-02-25 DIAGNOSIS — I10 Essential (primary) hypertension: Secondary | ICD-10-CM | POA: Diagnosis not present

## 2016-02-25 DIAGNOSIS — E782 Mixed hyperlipidemia: Secondary | ICD-10-CM | POA: Diagnosis not present

## 2016-02-25 DIAGNOSIS — F419 Anxiety disorder, unspecified: Secondary | ICD-10-CM | POA: Diagnosis not present

## 2016-02-25 DIAGNOSIS — G20 Parkinson's disease: Secondary | ICD-10-CM | POA: Diagnosis not present

## 2016-03-01 DIAGNOSIS — R200 Anesthesia of skin: Secondary | ICD-10-CM | POA: Diagnosis not present

## 2016-03-01 DIAGNOSIS — R292 Abnormal reflex: Secondary | ICD-10-CM | POA: Diagnosis not present

## 2016-03-01 DIAGNOSIS — Z8679 Personal history of other diseases of the circulatory system: Secondary | ICD-10-CM | POA: Diagnosis not present

## 2016-03-01 DIAGNOSIS — M79605 Pain in left leg: Secondary | ICD-10-CM | POA: Diagnosis not present

## 2016-03-01 DIAGNOSIS — G20 Parkinson's disease: Secondary | ICD-10-CM | POA: Diagnosis not present

## 2016-03-01 DIAGNOSIS — M79604 Pain in right leg: Secondary | ICD-10-CM | POA: Diagnosis not present

## 2016-03-01 DIAGNOSIS — Z7982 Long term (current) use of aspirin: Secondary | ICD-10-CM | POA: Diagnosis not present

## 2016-03-01 DIAGNOSIS — R42 Dizziness and giddiness: Secondary | ICD-10-CM | POA: Diagnosis not present

## 2016-03-24 DIAGNOSIS — I2510 Atherosclerotic heart disease of native coronary artery without angina pectoris: Secondary | ICD-10-CM | POA: Diagnosis not present

## 2016-03-24 DIAGNOSIS — R42 Dizziness and giddiness: Secondary | ICD-10-CM | POA: Diagnosis not present

## 2016-04-01 DIAGNOSIS — R1312 Dysphagia, oropharyngeal phase: Secondary | ICD-10-CM | POA: Diagnosis not present

## 2016-04-05 DIAGNOSIS — M4313 Spondylolisthesis, cervicothoracic region: Secondary | ICD-10-CM | POA: Diagnosis not present

## 2016-04-05 DIAGNOSIS — M899 Disorder of bone, unspecified: Secondary | ICD-10-CM | POA: Diagnosis not present

## 2016-04-05 DIAGNOSIS — M519 Unspecified thoracic, thoracolumbar and lumbosacral intervertebral disc disorder: Secondary | ICD-10-CM | POA: Diagnosis not present

## 2016-04-05 DIAGNOSIS — M50222 Other cervical disc displacement at C5-C6 level: Secondary | ICD-10-CM | POA: Diagnosis not present

## 2016-04-05 DIAGNOSIS — D1809 Hemangioma of other sites: Secondary | ICD-10-CM | POA: Diagnosis not present

## 2016-04-05 DIAGNOSIS — M50221 Other cervical disc displacement at C4-C5 level: Secondary | ICD-10-CM | POA: Diagnosis not present

## 2016-04-05 DIAGNOSIS — M5134 Other intervertebral disc degeneration, thoracic region: Secondary | ICD-10-CM | POA: Diagnosis not present

## 2016-04-05 DIAGNOSIS — M4312 Spondylolisthesis, cervical region: Secondary | ICD-10-CM | POA: Diagnosis not present

## 2016-04-05 DIAGNOSIS — M4050 Lordosis, unspecified, site unspecified: Secondary | ICD-10-CM | POA: Diagnosis not present

## 2016-04-10 DIAGNOSIS — R3129 Other microscopic hematuria: Secondary | ICD-10-CM | POA: Diagnosis not present

## 2016-04-12 DIAGNOSIS — I471 Supraventricular tachycardia: Secondary | ICD-10-CM | POA: Diagnosis not present

## 2016-04-12 DIAGNOSIS — Z951 Presence of aortocoronary bypass graft: Secondary | ICD-10-CM | POA: Diagnosis not present

## 2016-04-12 DIAGNOSIS — Z6824 Body mass index (BMI) 24.0-24.9, adult: Secondary | ICD-10-CM | POA: Diagnosis not present

## 2016-04-12 DIAGNOSIS — R42 Dizziness and giddiness: Secondary | ICD-10-CM | POA: Diagnosis not present

## 2016-04-14 DIAGNOSIS — R200 Anesthesia of skin: Secondary | ICD-10-CM | POA: Diagnosis not present

## 2016-04-14 DIAGNOSIS — R2689 Other abnormalities of gait and mobility: Secondary | ICD-10-CM | POA: Diagnosis not present

## 2016-04-14 DIAGNOSIS — G20 Parkinson's disease: Secondary | ICD-10-CM | POA: Diagnosis not present

## 2016-04-14 DIAGNOSIS — D1809 Hemangioma of other sites: Secondary | ICD-10-CM | POA: Diagnosis not present

## 2016-04-14 DIAGNOSIS — R202 Paresthesia of skin: Secondary | ICD-10-CM | POA: Diagnosis not present

## 2016-04-14 DIAGNOSIS — M79605 Pain in left leg: Secondary | ICD-10-CM | POA: Diagnosis not present

## 2016-04-14 DIAGNOSIS — M79604 Pain in right leg: Secondary | ICD-10-CM | POA: Diagnosis not present

## 2016-04-14 DIAGNOSIS — Z79899 Other long term (current) drug therapy: Secondary | ICD-10-CM | POA: Diagnosis not present

## 2016-05-10 DIAGNOSIS — M79604 Pain in right leg: Secondary | ICD-10-CM | POA: Diagnosis not present

## 2016-05-10 DIAGNOSIS — G6289 Other specified polyneuropathies: Secondary | ICD-10-CM | POA: Diagnosis not present

## 2016-05-10 DIAGNOSIS — M79605 Pain in left leg: Secondary | ICD-10-CM | POA: Diagnosis not present

## 2016-05-10 DIAGNOSIS — R200 Anesthesia of skin: Secondary | ICD-10-CM | POA: Diagnosis not present

## 2016-05-10 DIAGNOSIS — R202 Paresthesia of skin: Secondary | ICD-10-CM | POA: Diagnosis not present

## 2016-05-10 DIAGNOSIS — G629 Polyneuropathy, unspecified: Secondary | ICD-10-CM | POA: Diagnosis not present

## 2016-05-12 DIAGNOSIS — F419 Anxiety disorder, unspecified: Secondary | ICD-10-CM | POA: Diagnosis not present

## 2016-05-12 DIAGNOSIS — R109 Unspecified abdominal pain: Secondary | ICD-10-CM | POA: Diagnosis not present

## 2016-05-12 DIAGNOSIS — R39198 Other difficulties with micturition: Secondary | ICD-10-CM | POA: Diagnosis not present

## 2016-05-12 DIAGNOSIS — G479 Sleep disorder, unspecified: Secondary | ICD-10-CM | POA: Diagnosis not present

## 2016-05-12 DIAGNOSIS — H539 Unspecified visual disturbance: Secondary | ICD-10-CM | POA: Diagnosis not present

## 2016-05-12 DIAGNOSIS — G20 Parkinson's disease: Secondary | ICD-10-CM | POA: Diagnosis not present

## 2016-05-12 DIAGNOSIS — R4689 Other symptoms and signs involving appearance and behavior: Secondary | ICD-10-CM | POA: Diagnosis not present

## 2016-05-12 DIAGNOSIS — G6289 Other specified polyneuropathies: Secondary | ICD-10-CM | POA: Diagnosis not present

## 2016-05-12 DIAGNOSIS — H9190 Unspecified hearing loss, unspecified ear: Secondary | ICD-10-CM | POA: Diagnosis not present

## 2016-05-18 DIAGNOSIS — H5053 Vertical heterophoria: Secondary | ICD-10-CM | POA: Diagnosis not present

## 2016-05-18 DIAGNOSIS — Z8669 Personal history of other diseases of the nervous system and sense organs: Secondary | ICD-10-CM | POA: Diagnosis not present

## 2016-05-31 DIAGNOSIS — R1312 Dysphagia, oropharyngeal phase: Secondary | ICD-10-CM | POA: Diagnosis not present

## 2016-06-03 DIAGNOSIS — G20 Parkinson's disease: Secondary | ICD-10-CM | POA: Diagnosis not present

## 2016-06-03 DIAGNOSIS — R768 Other specified abnormal immunological findings in serum: Secondary | ICD-10-CM | POA: Diagnosis not present

## 2016-06-11 DIAGNOSIS — N401 Enlarged prostate with lower urinary tract symptoms: Secondary | ICD-10-CM | POA: Diagnosis not present

## 2016-06-11 DIAGNOSIS — R310 Gross hematuria: Secondary | ICD-10-CM | POA: Diagnosis not present

## 2016-06-11 DIAGNOSIS — N200 Calculus of kidney: Secondary | ICD-10-CM | POA: Diagnosis not present

## 2016-06-11 DIAGNOSIS — R351 Nocturia: Secondary | ICD-10-CM | POA: Diagnosis not present

## 2016-06-17 DIAGNOSIS — R200 Anesthesia of skin: Secondary | ICD-10-CM | POA: Diagnosis not present

## 2016-06-17 DIAGNOSIS — G8929 Other chronic pain: Secondary | ICD-10-CM | POA: Diagnosis not present

## 2016-06-17 DIAGNOSIS — Z79899 Other long term (current) drug therapy: Secondary | ICD-10-CM | POA: Diagnosis not present

## 2016-06-17 DIAGNOSIS — M4807 Spinal stenosis, lumbosacral region: Secondary | ICD-10-CM | POA: Diagnosis not present

## 2016-06-17 DIAGNOSIS — D1809 Hemangioma of other sites: Secondary | ICD-10-CM | POA: Diagnosis not present

## 2016-06-17 DIAGNOSIS — M79605 Pain in left leg: Secondary | ICD-10-CM | POA: Diagnosis not present

## 2016-06-17 DIAGNOSIS — Z880 Allergy status to penicillin: Secondary | ICD-10-CM | POA: Diagnosis not present

## 2016-06-17 DIAGNOSIS — M79651 Pain in right thigh: Secondary | ICD-10-CM | POA: Diagnosis not present

## 2016-06-17 DIAGNOSIS — I2510 Atherosclerotic heart disease of native coronary artery without angina pectoris: Secondary | ICD-10-CM | POA: Diagnosis not present

## 2016-06-17 DIAGNOSIS — M545 Low back pain: Secondary | ICD-10-CM | POA: Diagnosis not present

## 2016-06-17 DIAGNOSIS — M4316 Spondylolisthesis, lumbar region: Secondary | ICD-10-CM | POA: Diagnosis not present

## 2016-06-17 DIAGNOSIS — M4312 Spondylolisthesis, cervical region: Secondary | ICD-10-CM | POA: Diagnosis not present

## 2016-06-17 DIAGNOSIS — R202 Paresthesia of skin: Secondary | ICD-10-CM | POA: Diagnosis not present

## 2016-06-17 DIAGNOSIS — M79652 Pain in left thigh: Secondary | ICD-10-CM

## 2016-06-17 DIAGNOSIS — M8588 Other specified disorders of bone density and structure, other site: Secondary | ICD-10-CM | POA: Diagnosis not present

## 2016-06-17 DIAGNOSIS — M5116 Intervertebral disc disorders with radiculopathy, lumbar region: Secondary | ICD-10-CM | POA: Diagnosis not present

## 2016-06-17 DIAGNOSIS — G5793 Unspecified mononeuropathy of bilateral lower limbs: Secondary | ICD-10-CM | POA: Insufficient documentation

## 2016-06-17 DIAGNOSIS — M542 Cervicalgia: Secondary | ICD-10-CM | POA: Diagnosis not present

## 2016-06-17 DIAGNOSIS — M4802 Spinal stenosis, cervical region: Secondary | ICD-10-CM | POA: Diagnosis not present

## 2016-06-17 DIAGNOSIS — Z951 Presence of aortocoronary bypass graft: Secondary | ICD-10-CM | POA: Diagnosis not present

## 2016-06-17 DIAGNOSIS — G20 Parkinson's disease: Secondary | ICD-10-CM | POA: Diagnosis not present

## 2016-06-17 DIAGNOSIS — M79604 Pain in right leg: Secondary | ICD-10-CM | POA: Diagnosis not present

## 2016-06-17 DIAGNOSIS — G629 Polyneuropathy, unspecified: Secondary | ICD-10-CM | POA: Diagnosis not present

## 2016-06-30 DIAGNOSIS — M79643 Pain in unspecified hand: Secondary | ICD-10-CM | POA: Diagnosis not present

## 2016-06-30 DIAGNOSIS — M545 Low back pain: Secondary | ICD-10-CM | POA: Diagnosis not present

## 2016-06-30 DIAGNOSIS — M08041 Unspecified juvenile rheumatoid arthritis, right hand: Secondary | ICD-10-CM | POA: Diagnosis not present

## 2016-06-30 DIAGNOSIS — R768 Other specified abnormal immunological findings in serum: Secondary | ICD-10-CM | POA: Diagnosis not present

## 2016-06-30 DIAGNOSIS — M08042 Unspecified juvenile rheumatoid arthritis, left hand: Secondary | ICD-10-CM | POA: Diagnosis not present

## 2016-06-30 DIAGNOSIS — M549 Dorsalgia, unspecified: Secondary | ICD-10-CM | POA: Diagnosis not present

## 2016-06-30 DIAGNOSIS — M064 Inflammatory polyarthropathy: Secondary | ICD-10-CM | POA: Diagnosis not present

## 2016-07-05 DIAGNOSIS — M4804 Spinal stenosis, thoracic region: Secondary | ICD-10-CM | POA: Diagnosis not present

## 2016-07-05 DIAGNOSIS — D1809 Hemangioma of other sites: Secondary | ICD-10-CM | POA: Diagnosis not present

## 2016-07-06 DIAGNOSIS — G2581 Restless legs syndrome: Secondary | ICD-10-CM | POA: Insufficient documentation

## 2016-07-06 DIAGNOSIS — G20 Parkinson's disease: Secondary | ICD-10-CM | POA: Insufficient documentation

## 2016-07-06 DIAGNOSIS — F410 Panic disorder [episodic paroxysmal anxiety] without agoraphobia: Secondary | ICD-10-CM | POA: Insufficient documentation

## 2016-07-06 DIAGNOSIS — Z961 Presence of intraocular lens: Secondary | ICD-10-CM | POA: Diagnosis not present

## 2016-07-06 DIAGNOSIS — H26492 Other secondary cataract, left eye: Secondary | ICD-10-CM | POA: Diagnosis not present

## 2016-07-06 DIAGNOSIS — H524 Presbyopia: Secondary | ICD-10-CM | POA: Diagnosis not present

## 2016-07-06 DIAGNOSIS — F419 Anxiety disorder, unspecified: Secondary | ICD-10-CM | POA: Insufficient documentation

## 2016-07-06 DIAGNOSIS — G4733 Obstructive sleep apnea (adult) (pediatric): Secondary | ICD-10-CM | POA: Insufficient documentation

## 2016-07-07 ENCOUNTER — Encounter: Payer: Self-pay | Admitting: Cardiovascular Disease

## 2016-07-07 ENCOUNTER — Encounter (INDEPENDENT_AMBULATORY_CARE_PROVIDER_SITE_OTHER): Payer: Self-pay

## 2016-07-07 ENCOUNTER — Ambulatory Visit (INDEPENDENT_AMBULATORY_CARE_PROVIDER_SITE_OTHER): Payer: Medicare Other | Admitting: Cardiovascular Disease

## 2016-07-07 DIAGNOSIS — I2510 Atherosclerotic heart disease of native coronary artery without angina pectoris: Secondary | ICD-10-CM

## 2016-07-07 DIAGNOSIS — E785 Hyperlipidemia, unspecified: Secondary | ICD-10-CM | POA: Insufficient documentation

## 2016-07-07 DIAGNOSIS — I10 Essential (primary) hypertension: Secondary | ICD-10-CM

## 2016-07-07 DIAGNOSIS — E7800 Pure hypercholesterolemia, unspecified: Secondary | ICD-10-CM

## 2016-07-07 NOTE — Progress Notes (Signed)
07/07/2016 Paul Ortiz   18-Oct-1945  [id number]  Primary Physician Shirline Frees, MD Primary Cardiologist: Lorretta Harp MD Renae Gloss  HPI:  [name] is a delightful 71 year old thin appearing married Caucasian male father of 2, grandfather and 3 grandchildren's wife Paul Ortiz is accompanying him today and is also a patient of mine. He was referred by Dr. Kenton Kingfisher for cardiovascular evaluation and establishment of practice. He is anticipating getting a implantable neurostimulator at [facility] for Parkinson's disease and is cardiovascular clearance before the procedure and to stop his aspirin. He is retired from working until recovery. He has never smoked. He does have hypertension and hyperlipidemia as well as family history with a father who died of a myocardial approximately 74 to get a microinfarction in 2005 subsequent bypass grafting 3 by Dr. Tharon Aquas Trigt . He has had no real tissues with this since. He does have advanced Parkinson's disease and walks with a walker. He does get occasional palpitations and had a event monitor done back in February that showed occasional PVCs, PACs or short run of SVT.   Current Outpatient Prescriptions  Medication Sig Dispense Refill  . ALPRAZolam (XANAX) 1 MG tablet Take 1 mg by mouth at bedtime.    Marland Kitchen aspirin 325 MG tablet Take 325 mg by mouth daily.    Marland Kitchen atorvastatin (LIPITOR) 20 MG tablet Take 20 mg by mouth daily.    . B Complex Vitamins (VITAMIN B COMPLEX PO) Take 1 capsule by mouth daily.    . carbidopa-levodopa (SINEMET CR) 50-200 MG tablet Take 1 tablet by mouth at bedtime.    . carbidopa-levodopa (SINEMET IR) 25-250 MG tablet Take 1 tablet by mouth 3 (three) times daily.    . Cholecalciferol (VITAMIN D-1000 MAX ST) 1000 units tablet Take 1 tablet by mouth daily.    Marland Kitchen ipratropium (ATROVENT) 0.06 % nasal spray Place 2 sprays into the nose as needed.    . metoprolol succinate (TOPROL-XL) 25 MG 24 hr  tablet Take 25 mg by mouth at bedtime.    . Omega-3 Fatty Acids (FISH OIL) 1000 MG CAPS Take 1,000 mg by mouth daily.    . ondansetron (ZOFRAN) 8 MG tablet Take 8 mg by mouth as needed.    Marland Kitchen oxybutynin (DITROPAN) 5 MG tablet Take 5 mg by mouth as needed.    . potassium chloride (MICRO-K) 10 MEQ CR capsule Take 1 capsule by mouth daily.    . Rotigotine 3 MG/24HR PT24 Place 1 patch onto the skin daily.    . tamsulosin (FLOMAX) 0.4 MG CAPS capsule Take 0.4 mg by mouth daily.     No current facility-administered medications for this visit.     Allergies  Allergen Reactions  . Lactose Intolerance (Gi) Diarrhea  . Penicillins Rash    Social History   Social History  . Marital status: Married    Spouse name: N/A  . Number of children: N/A  . Years of education: N/A   Occupational History  . Not on file.   Social History Main Topics  . Smoking status: Never Smoker  . Smokeless tobacco: Not on file  . Alcohol use Not on file  . Drug use: Unknown  . Sexual activity: Not on file   Other Topics Concern  . Not on file   Social History Narrative  . No narrative on file     Review of Systems: General: negative for chills, fever, night sweats or weight  changes.  Cardiovascular: negative for chest pain, dyspnea on exertion, edema, orthopnea, palpitations, paroxysmal nocturnal dyspnea or shortness of breath Dermatological: negative for rash Respiratory: negative for cough or wheezing Urologic: negative for hematuria Abdominal: negative for nausea, vomiting, diarrhea, bright red blood per rectum, melena, or hematemesis Neurologic: negative for visual changes, syncope, or dizziness All other systems reviewed and are otherwise negative except as noted above.    Blood pressure 112/60, pulse 72, height 6' (1.829 m), weight 174 lb (78.9 kg).  General appearance: alert and no distress Neck: no adenopathy, no carotid bruit, no JVD, supple, symmetrical, trachea midline and thyroid not  enlarged, symmetric, no tenderness/mass/nodules Lungs: clear to auscultation bilaterally Heart: regular rate and rhythm, S1, S2 normal, no murmur, click, rub or gallop Extremities: extremities normal, atraumatic, no cyanosis or edema  EKG sinus rhythm at 72 with incomplete right bundle branch block and inferior Q waves. I personally reviewed this EKG  ASSESSMENT AND PLAN:   Hyperlipidemia History of hyperlipidemia on statin therapy followed by the [facility]  Essential hypertension History of essential hypertension blood pressure measured 112/60. He is on Toprol. Continue current meds at current dosing  Coronary artery disease involving native coronary artery of native heart without angina pectoris History of coronary artery disease status post coronary artery bypass grafting 3 2005 after a myocardial infarction by Dr. Tharon Aquas Trigt . He has had no catheter intervention since. He denies ischemic chest pain.      Lorretta Harp MD FACP,FACC,FAHA, [facility] 07/07/2016 9:19 AM

## 2016-07-07 NOTE — Patient Instructions (Signed)
Medication Instructions: Your physician recommends that you continue on your current medications as directed. Please refer to the Current Medication list given to you today.  OK to stop aspirin for 7 days prior to Neurosurgery. You have been cleared for surgery.  Follow-Up: Your physician wants you to follow-up in: 1 year with Dr. Gwenlyn Found. You will receive a reminder letter in the mail two months in advance. If you don't receive a letter, please call our office to schedule the follow-up appointment.  If you need a refill on your cardiac medications before your next appointment, please call your pharmacy.

## 2016-07-07 NOTE — Assessment & Plan Note (Signed)
History of hyperlipidemia on statin therapy followed by the [facility]. 

## 2016-07-07 NOTE — Assessment & Plan Note (Signed)
History of coronary artery disease status post coronary artery bypass grafting 3 2005 after a myocardial infarction by Dr. Tharon Aquas Trigt . He has had no catheter intervention since. He denies ischemic chest pain.

## 2016-07-07 NOTE — Assessment & Plan Note (Addendum)
History of essential hypertension blood pressure measured 112/60. He is on Toprol. Continue current meds at current dosing

## 2016-07-13 DIAGNOSIS — M549 Dorsalgia, unspecified: Secondary | ICD-10-CM | POA: Diagnosis not present

## 2016-07-13 DIAGNOSIS — M064 Inflammatory polyarthropathy: Secondary | ICD-10-CM | POA: Diagnosis not present

## 2016-07-13 DIAGNOSIS — R768 Other specified abnormal immunological findings in serum: Secondary | ICD-10-CM | POA: Diagnosis not present

## 2016-07-13 DIAGNOSIS — M79643 Pain in unspecified hand: Secondary | ICD-10-CM | POA: Diagnosis not present

## 2016-09-08 DIAGNOSIS — Z79899 Other long term (current) drug therapy: Secondary | ICD-10-CM | POA: Diagnosis not present

## 2016-09-08 DIAGNOSIS — I2510 Atherosclerotic heart disease of native coronary artery without angina pectoris: Secondary | ICD-10-CM | POA: Diagnosis not present

## 2016-09-08 DIAGNOSIS — Z7982 Long term (current) use of aspirin: Secondary | ICD-10-CM | POA: Diagnosis not present

## 2016-09-08 DIAGNOSIS — F329 Major depressive disorder, single episode, unspecified: Secondary | ICD-10-CM | POA: Diagnosis not present

## 2016-09-08 DIAGNOSIS — G4733 Obstructive sleep apnea (adult) (pediatric): Secondary | ICD-10-CM | POA: Diagnosis not present

## 2016-09-08 DIAGNOSIS — G20 Parkinson's disease: Secondary | ICD-10-CM | POA: Diagnosis not present

## 2016-09-08 DIAGNOSIS — G3184 Mild cognitive impairment, so stated: Secondary | ICD-10-CM | POA: Diagnosis not present

## 2016-09-08 DIAGNOSIS — Z951 Presence of aortocoronary bypass graft: Secondary | ICD-10-CM | POA: Diagnosis not present

## 2016-09-08 DIAGNOSIS — F410 Panic disorder [episodic paroxysmal anxiety] without agoraphobia: Secondary | ICD-10-CM | POA: Diagnosis not present

## 2016-09-08 DIAGNOSIS — F321 Major depressive disorder, single episode, moderate: Secondary | ICD-10-CM | POA: Diagnosis not present

## 2016-09-08 DIAGNOSIS — G629 Polyneuropathy, unspecified: Secondary | ICD-10-CM | POA: Diagnosis not present

## 2016-09-14 DIAGNOSIS — R768 Other specified abnormal immunological findings in serum: Secondary | ICD-10-CM | POA: Diagnosis not present

## 2016-09-14 DIAGNOSIS — M064 Inflammatory polyarthropathy: Secondary | ICD-10-CM | POA: Diagnosis not present

## 2016-09-14 DIAGNOSIS — M549 Dorsalgia, unspecified: Secondary | ICD-10-CM | POA: Diagnosis not present

## 2016-09-14 DIAGNOSIS — M79643 Pain in unspecified hand: Secondary | ICD-10-CM | POA: Diagnosis not present

## 2016-09-14 DIAGNOSIS — M25551 Pain in right hip: Secondary | ICD-10-CM | POA: Diagnosis not present

## 2016-09-16 DIAGNOSIS — G629 Polyneuropathy, unspecified: Secondary | ICD-10-CM | POA: Diagnosis not present

## 2016-09-16 DIAGNOSIS — Z79899 Other long term (current) drug therapy: Secondary | ICD-10-CM | POA: Diagnosis not present

## 2016-09-16 DIAGNOSIS — M5416 Radiculopathy, lumbar region: Secondary | ICD-10-CM | POA: Diagnosis not present

## 2016-09-16 DIAGNOSIS — M79652 Pain in left thigh: Secondary | ICD-10-CM | POA: Diagnosis not present

## 2016-09-16 DIAGNOSIS — G8929 Other chronic pain: Secondary | ICD-10-CM | POA: Diagnosis not present

## 2016-09-16 DIAGNOSIS — I2510 Atherosclerotic heart disease of native coronary artery without angina pectoris: Secondary | ICD-10-CM | POA: Diagnosis not present

## 2016-09-16 DIAGNOSIS — G20 Parkinson's disease: Secondary | ICD-10-CM | POA: Diagnosis not present

## 2016-09-16 DIAGNOSIS — M79651 Pain in right thigh: Secondary | ICD-10-CM | POA: Diagnosis not present

## 2016-09-16 DIAGNOSIS — Z7982 Long term (current) use of aspirin: Secondary | ICD-10-CM | POA: Diagnosis not present

## 2016-09-16 DIAGNOSIS — M545 Low back pain: Secondary | ICD-10-CM | POA: Diagnosis not present

## 2016-09-16 DIAGNOSIS — Z880 Allergy status to penicillin: Secondary | ICD-10-CM | POA: Diagnosis not present

## 2016-09-16 DIAGNOSIS — D1809 Hemangioma of other sites: Secondary | ICD-10-CM | POA: Diagnosis not present

## 2016-09-28 DIAGNOSIS — G20 Parkinson's disease: Secondary | ICD-10-CM | POA: Diagnosis not present

## 2016-09-28 DIAGNOSIS — Z8669 Personal history of other diseases of the nervous system and sense organs: Secondary | ICD-10-CM | POA: Diagnosis not present

## 2016-09-28 DIAGNOSIS — H5053 Vertical heterophoria: Secondary | ICD-10-CM | POA: Diagnosis not present

## 2016-09-29 DIAGNOSIS — I252 Old myocardial infarction: Secondary | ICD-10-CM | POA: Diagnosis not present

## 2016-09-29 DIAGNOSIS — Z7982 Long term (current) use of aspirin: Secondary | ICD-10-CM | POA: Diagnosis not present

## 2016-09-29 DIAGNOSIS — I2510 Atherosclerotic heart disease of native coronary artery without angina pectoris: Secondary | ICD-10-CM | POA: Diagnosis not present

## 2016-09-29 DIAGNOSIS — F419 Anxiety disorder, unspecified: Secondary | ICD-10-CM | POA: Diagnosis not present

## 2016-09-29 DIAGNOSIS — F329 Major depressive disorder, single episode, unspecified: Secondary | ICD-10-CM | POA: Diagnosis not present

## 2016-09-29 DIAGNOSIS — G20 Parkinson's disease: Secondary | ICD-10-CM | POA: Diagnosis not present

## 2016-09-29 DIAGNOSIS — Z79899 Other long term (current) drug therapy: Secondary | ICD-10-CM | POA: Diagnosis not present

## 2016-09-29 DIAGNOSIS — F418 Other specified anxiety disorders: Secondary | ICD-10-CM | POA: Diagnosis not present

## 2016-09-29 DIAGNOSIS — Z880 Allergy status to penicillin: Secondary | ICD-10-CM | POA: Diagnosis not present

## 2016-10-06 DIAGNOSIS — M9973 Connective tissue and disc stenosis of intervertebral foramina of lumbar region: Secondary | ICD-10-CM | POA: Diagnosis not present

## 2016-10-06 DIAGNOSIS — M5116 Intervertebral disc disorders with radiculopathy, lumbar region: Secondary | ICD-10-CM | POA: Diagnosis not present

## 2016-10-06 DIAGNOSIS — M5126 Other intervertebral disc displacement, lumbar region: Secondary | ICD-10-CM | POA: Diagnosis not present

## 2016-10-06 DIAGNOSIS — M4726 Other spondylosis with radiculopathy, lumbar region: Secondary | ICD-10-CM | POA: Diagnosis not present

## 2016-10-06 DIAGNOSIS — M48061 Spinal stenosis, lumbar region without neurogenic claudication: Secondary | ICD-10-CM | POA: Diagnosis not present

## 2016-10-06 DIAGNOSIS — M419 Scoliosis, unspecified: Secondary | ICD-10-CM | POA: Diagnosis not present

## 2016-10-06 DIAGNOSIS — M545 Low back pain: Secondary | ICD-10-CM | POA: Diagnosis not present

## 2016-10-06 DIAGNOSIS — M5136 Other intervertebral disc degeneration, lumbar region: Secondary | ICD-10-CM | POA: Diagnosis not present

## 2016-11-02 DIAGNOSIS — G20 Parkinson's disease: Secondary | ICD-10-CM | POA: Diagnosis not present

## 2016-11-02 DIAGNOSIS — K5909 Other constipation: Secondary | ICD-10-CM | POA: Diagnosis not present

## 2016-11-09 DIAGNOSIS — Z23 Encounter for immunization: Secondary | ICD-10-CM | POA: Diagnosis not present

## 2016-11-16 DIAGNOSIS — Z7982 Long term (current) use of aspirin: Secondary | ICD-10-CM | POA: Diagnosis not present

## 2016-11-16 DIAGNOSIS — I252 Old myocardial infarction: Secondary | ICD-10-CM | POA: Diagnosis not present

## 2016-11-16 DIAGNOSIS — G20 Parkinson's disease: Secondary | ICD-10-CM | POA: Diagnosis not present

## 2016-11-16 DIAGNOSIS — I2510 Atherosclerotic heart disease of native coronary artery without angina pectoris: Secondary | ICD-10-CM | POA: Diagnosis not present

## 2016-11-16 DIAGNOSIS — M25551 Pain in right hip: Secondary | ICD-10-CM | POA: Diagnosis not present

## 2016-11-16 DIAGNOSIS — Z79899 Other long term (current) drug therapy: Secondary | ICD-10-CM | POA: Diagnosis not present

## 2016-11-16 DIAGNOSIS — Z951 Presence of aortocoronary bypass graft: Secondary | ICD-10-CM | POA: Diagnosis not present

## 2016-11-16 DIAGNOSIS — Z880 Allergy status to penicillin: Secondary | ICD-10-CM | POA: Diagnosis not present

## 2016-12-16 DIAGNOSIS — I2510 Atherosclerotic heart disease of native coronary artery without angina pectoris: Secondary | ICD-10-CM | POA: Diagnosis not present

## 2016-12-16 DIAGNOSIS — Z7982 Long term (current) use of aspirin: Secondary | ICD-10-CM | POA: Diagnosis not present

## 2016-12-16 DIAGNOSIS — Z91011 Allergy to milk products: Secondary | ICD-10-CM | POA: Diagnosis not present

## 2016-12-16 DIAGNOSIS — N400 Enlarged prostate without lower urinary tract symptoms: Secondary | ICD-10-CM | POA: Diagnosis not present

## 2016-12-16 DIAGNOSIS — I252 Old myocardial infarction: Secondary | ICD-10-CM | POA: Diagnosis not present

## 2016-12-16 DIAGNOSIS — G2581 Restless legs syndrome: Secondary | ICD-10-CM | POA: Diagnosis not present

## 2016-12-16 DIAGNOSIS — G20 Parkinson's disease: Secondary | ICD-10-CM | POA: Diagnosis not present

## 2016-12-16 DIAGNOSIS — R633 Feeding difficulties: Secondary | ICD-10-CM | POA: Diagnosis not present

## 2016-12-16 DIAGNOSIS — Z791 Long term (current) use of non-steroidal anti-inflammatories (NSAID): Secondary | ICD-10-CM | POA: Diagnosis not present

## 2016-12-16 DIAGNOSIS — Z9889 Other specified postprocedural states: Secondary | ICD-10-CM | POA: Diagnosis not present

## 2016-12-16 DIAGNOSIS — G4733 Obstructive sleep apnea (adult) (pediatric): Secondary | ICD-10-CM | POA: Diagnosis not present

## 2016-12-16 DIAGNOSIS — E785 Hyperlipidemia, unspecified: Secondary | ICD-10-CM | POA: Diagnosis not present

## 2016-12-16 DIAGNOSIS — I10 Essential (primary) hypertension: Secondary | ICD-10-CM | POA: Diagnosis not present

## 2016-12-16 DIAGNOSIS — F419 Anxiety disorder, unspecified: Secondary | ICD-10-CM | POA: Diagnosis not present

## 2016-12-16 DIAGNOSIS — Z880 Allergy status to penicillin: Secondary | ICD-10-CM | POA: Diagnosis not present

## 2016-12-16 DIAGNOSIS — Z79899 Other long term (current) drug therapy: Secondary | ICD-10-CM | POA: Diagnosis not present

## 2016-12-16 DIAGNOSIS — Z951 Presence of aortocoronary bypass graft: Secondary | ICD-10-CM | POA: Diagnosis not present

## 2016-12-17 DIAGNOSIS — I10 Essential (primary) hypertension: Secondary | ICD-10-CM | POA: Diagnosis not present

## 2016-12-17 DIAGNOSIS — Z9889 Other specified postprocedural states: Secondary | ICD-10-CM | POA: Diagnosis not present

## 2016-12-17 DIAGNOSIS — I2510 Atherosclerotic heart disease of native coronary artery without angina pectoris: Secondary | ICD-10-CM | POA: Diagnosis not present

## 2016-12-17 DIAGNOSIS — F419 Anxiety disorder, unspecified: Secondary | ICD-10-CM | POA: Diagnosis not present

## 2016-12-17 DIAGNOSIS — G2581 Restless legs syndrome: Secondary | ICD-10-CM | POA: Diagnosis not present

## 2016-12-17 DIAGNOSIS — G4733 Obstructive sleep apnea (adult) (pediatric): Secondary | ICD-10-CM | POA: Diagnosis not present

## 2016-12-17 DIAGNOSIS — G20 Parkinson's disease: Secondary | ICD-10-CM | POA: Diagnosis not present

## 2016-12-21 DIAGNOSIS — Z7982 Long term (current) use of aspirin: Secondary | ICD-10-CM | POA: Diagnosis not present

## 2016-12-21 DIAGNOSIS — G20 Parkinson's disease: Secondary | ICD-10-CM | POA: Diagnosis not present

## 2016-12-21 DIAGNOSIS — K591 Functional diarrhea: Secondary | ICD-10-CM | POA: Diagnosis not present

## 2016-12-21 DIAGNOSIS — Z79899 Other long term (current) drug therapy: Secondary | ICD-10-CM | POA: Diagnosis not present

## 2016-12-21 DIAGNOSIS — Z931 Gastrostomy status: Secondary | ICD-10-CM | POA: Diagnosis not present

## 2016-12-21 DIAGNOSIS — Z48815 Encounter for surgical aftercare following surgery on the digestive system: Secondary | ICD-10-CM | POA: Diagnosis not present

## 2016-12-21 DIAGNOSIS — Z951 Presence of aortocoronary bypass graft: Secondary | ICD-10-CM | POA: Diagnosis not present

## 2016-12-21 DIAGNOSIS — I2510 Atherosclerotic heart disease of native coronary artery without angina pectoris: Secondary | ICD-10-CM | POA: Diagnosis not present

## 2016-12-22 DIAGNOSIS — T8579XA Infection and inflammatory reaction due to other internal prosthetic devices, implants and grafts, initial encounter: Secondary | ICD-10-CM | POA: Diagnosis not present

## 2016-12-22 DIAGNOSIS — Z9889 Other specified postprocedural states: Secondary | ICD-10-CM | POA: Diagnosis not present

## 2016-12-22 DIAGNOSIS — Z880 Allergy status to penicillin: Secondary | ICD-10-CM | POA: Diagnosis not present

## 2016-12-22 DIAGNOSIS — R109 Unspecified abdominal pain: Secondary | ICD-10-CM | POA: Diagnosis not present

## 2016-12-22 DIAGNOSIS — L03311 Cellulitis of abdominal wall: Secondary | ICD-10-CM | POA: Diagnosis not present

## 2016-12-27 DIAGNOSIS — G20 Parkinson's disease: Secondary | ICD-10-CM | POA: Diagnosis not present

## 2016-12-27 DIAGNOSIS — Z79899 Other long term (current) drug therapy: Secondary | ICD-10-CM | POA: Diagnosis not present

## 2016-12-27 DIAGNOSIS — R600 Localized edema: Secondary | ICD-10-CM | POA: Diagnosis not present

## 2016-12-27 DIAGNOSIS — Z978 Presence of other specified devices: Secondary | ICD-10-CM | POA: Diagnosis not present

## 2017-01-03 DIAGNOSIS — G2581 Restless legs syndrome: Secondary | ICD-10-CM | POA: Diagnosis not present

## 2017-01-03 DIAGNOSIS — G20 Parkinson's disease: Secondary | ICD-10-CM | POA: Diagnosis not present

## 2017-01-03 DIAGNOSIS — K9422 Gastrostomy infection: Secondary | ICD-10-CM | POA: Diagnosis not present

## 2017-01-03 DIAGNOSIS — M1991 Primary osteoarthritis, unspecified site: Secondary | ICD-10-CM | POA: Diagnosis not present

## 2017-01-03 DIAGNOSIS — M5416 Radiculopathy, lumbar region: Secondary | ICD-10-CM | POA: Diagnosis not present

## 2017-01-03 DIAGNOSIS — G629 Polyneuropathy, unspecified: Secondary | ICD-10-CM | POA: Diagnosis not present

## 2017-01-06 DIAGNOSIS — K9422 Gastrostomy infection: Secondary | ICD-10-CM | POA: Diagnosis not present

## 2017-01-06 DIAGNOSIS — M1991 Primary osteoarthritis, unspecified site: Secondary | ICD-10-CM | POA: Diagnosis not present

## 2017-01-06 DIAGNOSIS — G629 Polyneuropathy, unspecified: Secondary | ICD-10-CM | POA: Diagnosis not present

## 2017-01-06 DIAGNOSIS — G2581 Restless legs syndrome: Secondary | ICD-10-CM | POA: Diagnosis not present

## 2017-01-06 DIAGNOSIS — M5416 Radiculopathy, lumbar region: Secondary | ICD-10-CM | POA: Diagnosis not present

## 2017-01-06 DIAGNOSIS — G20 Parkinson's disease: Secondary | ICD-10-CM | POA: Diagnosis not present

## 2017-01-11 DIAGNOSIS — G2581 Restless legs syndrome: Secondary | ICD-10-CM | POA: Diagnosis not present

## 2017-01-11 DIAGNOSIS — G629 Polyneuropathy, unspecified: Secondary | ICD-10-CM | POA: Diagnosis not present

## 2017-01-11 DIAGNOSIS — K9422 Gastrostomy infection: Secondary | ICD-10-CM | POA: Diagnosis not present

## 2017-01-11 DIAGNOSIS — M1991 Primary osteoarthritis, unspecified site: Secondary | ICD-10-CM | POA: Diagnosis not present

## 2017-01-11 DIAGNOSIS — M5416 Radiculopathy, lumbar region: Secondary | ICD-10-CM | POA: Diagnosis not present

## 2017-01-11 DIAGNOSIS — G20 Parkinson's disease: Secondary | ICD-10-CM | POA: Diagnosis not present

## 2017-01-12 DIAGNOSIS — G2581 Restless legs syndrome: Secondary | ICD-10-CM | POA: Diagnosis not present

## 2017-01-12 DIAGNOSIS — G629 Polyneuropathy, unspecified: Secondary | ICD-10-CM | POA: Diagnosis not present

## 2017-01-12 DIAGNOSIS — M1991 Primary osteoarthritis, unspecified site: Secondary | ICD-10-CM | POA: Diagnosis not present

## 2017-01-12 DIAGNOSIS — K9422 Gastrostomy infection: Secondary | ICD-10-CM | POA: Diagnosis not present

## 2017-01-12 DIAGNOSIS — G20 Parkinson's disease: Secondary | ICD-10-CM | POA: Diagnosis not present

## 2017-01-12 DIAGNOSIS — M5416 Radiculopathy, lumbar region: Secondary | ICD-10-CM | POA: Diagnosis not present

## 2017-01-13 DIAGNOSIS — M1991 Primary osteoarthritis, unspecified site: Secondary | ICD-10-CM | POA: Diagnosis not present

## 2017-01-13 DIAGNOSIS — K9422 Gastrostomy infection: Secondary | ICD-10-CM | POA: Diagnosis not present

## 2017-01-13 DIAGNOSIS — G20 Parkinson's disease: Secondary | ICD-10-CM | POA: Diagnosis not present

## 2017-01-13 DIAGNOSIS — G2581 Restless legs syndrome: Secondary | ICD-10-CM | POA: Diagnosis not present

## 2017-01-13 DIAGNOSIS — G629 Polyneuropathy, unspecified: Secondary | ICD-10-CM | POA: Diagnosis not present

## 2017-01-13 DIAGNOSIS — M5416 Radiculopathy, lumbar region: Secondary | ICD-10-CM | POA: Diagnosis not present

## 2017-01-14 DIAGNOSIS — G20 Parkinson's disease: Secondary | ICD-10-CM | POA: Diagnosis not present

## 2017-01-14 DIAGNOSIS — G629 Polyneuropathy, unspecified: Secondary | ICD-10-CM | POA: Diagnosis not present

## 2017-01-14 DIAGNOSIS — M5416 Radiculopathy, lumbar region: Secondary | ICD-10-CM | POA: Diagnosis not present

## 2017-01-14 DIAGNOSIS — K9422 Gastrostomy infection: Secondary | ICD-10-CM | POA: Diagnosis not present

## 2017-01-14 DIAGNOSIS — M1991 Primary osteoarthritis, unspecified site: Secondary | ICD-10-CM | POA: Diagnosis not present

## 2017-01-14 DIAGNOSIS — G2581 Restless legs syndrome: Secondary | ICD-10-CM | POA: Diagnosis not present

## 2017-01-17 DIAGNOSIS — M5416 Radiculopathy, lumbar region: Secondary | ICD-10-CM | POA: Diagnosis not present

## 2017-01-17 DIAGNOSIS — M1991 Primary osteoarthritis, unspecified site: Secondary | ICD-10-CM | POA: Diagnosis not present

## 2017-01-17 DIAGNOSIS — G2581 Restless legs syndrome: Secondary | ICD-10-CM | POA: Diagnosis not present

## 2017-01-17 DIAGNOSIS — K9422 Gastrostomy infection: Secondary | ICD-10-CM | POA: Diagnosis not present

## 2017-01-17 DIAGNOSIS — G20 Parkinson's disease: Secondary | ICD-10-CM | POA: Diagnosis not present

## 2017-01-17 DIAGNOSIS — G629 Polyneuropathy, unspecified: Secondary | ICD-10-CM | POA: Diagnosis not present

## 2017-01-18 DIAGNOSIS — G629 Polyneuropathy, unspecified: Secondary | ICD-10-CM | POA: Diagnosis not present

## 2017-01-18 DIAGNOSIS — M5416 Radiculopathy, lumbar region: Secondary | ICD-10-CM | POA: Diagnosis not present

## 2017-01-18 DIAGNOSIS — M1991 Primary osteoarthritis, unspecified site: Secondary | ICD-10-CM | POA: Diagnosis not present

## 2017-01-18 DIAGNOSIS — G20 Parkinson's disease: Secondary | ICD-10-CM | POA: Diagnosis not present

## 2017-01-18 DIAGNOSIS — K9422 Gastrostomy infection: Secondary | ICD-10-CM | POA: Diagnosis not present

## 2017-01-18 DIAGNOSIS — G2581 Restless legs syndrome: Secondary | ICD-10-CM | POA: Diagnosis not present

## 2017-01-19 DIAGNOSIS — G20 Parkinson's disease: Secondary | ICD-10-CM | POA: Diagnosis not present

## 2017-01-19 DIAGNOSIS — M1991 Primary osteoarthritis, unspecified site: Secondary | ICD-10-CM | POA: Diagnosis not present

## 2017-01-19 DIAGNOSIS — G2581 Restless legs syndrome: Secondary | ICD-10-CM | POA: Diagnosis not present

## 2017-01-19 DIAGNOSIS — G629 Polyneuropathy, unspecified: Secondary | ICD-10-CM | POA: Diagnosis not present

## 2017-01-19 DIAGNOSIS — M5416 Radiculopathy, lumbar region: Secondary | ICD-10-CM | POA: Diagnosis not present

## 2017-01-19 DIAGNOSIS — K9422 Gastrostomy infection: Secondary | ICD-10-CM | POA: Diagnosis not present

## 2017-01-20 DIAGNOSIS — M1991 Primary osteoarthritis, unspecified site: Secondary | ICD-10-CM | POA: Diagnosis not present

## 2017-01-20 DIAGNOSIS — G629 Polyneuropathy, unspecified: Secondary | ICD-10-CM | POA: Diagnosis not present

## 2017-01-20 DIAGNOSIS — G20 Parkinson's disease: Secondary | ICD-10-CM | POA: Diagnosis not present

## 2017-01-20 DIAGNOSIS — G2581 Restless legs syndrome: Secondary | ICD-10-CM | POA: Diagnosis not present

## 2017-01-20 DIAGNOSIS — M5416 Radiculopathy, lumbar region: Secondary | ICD-10-CM | POA: Diagnosis not present

## 2017-01-20 DIAGNOSIS — K9422 Gastrostomy infection: Secondary | ICD-10-CM | POA: Diagnosis not present

## 2017-01-21 DIAGNOSIS — G20 Parkinson's disease: Secondary | ICD-10-CM | POA: Diagnosis not present

## 2017-01-21 DIAGNOSIS — Z79899 Other long term (current) drug therapy: Secondary | ICD-10-CM | POA: Diagnosis not present

## 2017-01-21 DIAGNOSIS — Z931 Gastrostomy status: Secondary | ICD-10-CM | POA: Diagnosis not present

## 2017-01-21 DIAGNOSIS — R031 Nonspecific low blood-pressure reading: Secondary | ICD-10-CM | POA: Diagnosis not present

## 2017-01-21 DIAGNOSIS — Z978 Presence of other specified devices: Secondary | ICD-10-CM | POA: Diagnosis not present

## 2017-01-24 DIAGNOSIS — M1991 Primary osteoarthritis, unspecified site: Secondary | ICD-10-CM | POA: Diagnosis not present

## 2017-01-24 DIAGNOSIS — K9422 Gastrostomy infection: Secondary | ICD-10-CM | POA: Diagnosis not present

## 2017-01-24 DIAGNOSIS — G20 Parkinson's disease: Secondary | ICD-10-CM | POA: Diagnosis not present

## 2017-01-24 DIAGNOSIS — G629 Polyneuropathy, unspecified: Secondary | ICD-10-CM | POA: Diagnosis not present

## 2017-01-24 DIAGNOSIS — M5416 Radiculopathy, lumbar region: Secondary | ICD-10-CM | POA: Diagnosis not present

## 2017-01-24 DIAGNOSIS — G2581 Restless legs syndrome: Secondary | ICD-10-CM | POA: Diagnosis not present

## 2017-01-26 DIAGNOSIS — G629 Polyneuropathy, unspecified: Secondary | ICD-10-CM | POA: Diagnosis not present

## 2017-01-26 DIAGNOSIS — G2581 Restless legs syndrome: Secondary | ICD-10-CM | POA: Diagnosis not present

## 2017-01-26 DIAGNOSIS — G20 Parkinson's disease: Secondary | ICD-10-CM | POA: Diagnosis not present

## 2017-01-26 DIAGNOSIS — M1991 Primary osteoarthritis, unspecified site: Secondary | ICD-10-CM | POA: Diagnosis not present

## 2017-01-26 DIAGNOSIS — M5416 Radiculopathy, lumbar region: Secondary | ICD-10-CM | POA: Diagnosis not present

## 2017-01-26 DIAGNOSIS — K9422 Gastrostomy infection: Secondary | ICD-10-CM | POA: Diagnosis not present

## 2017-01-27 DIAGNOSIS — G20 Parkinson's disease: Secondary | ICD-10-CM | POA: Diagnosis not present

## 2017-01-27 DIAGNOSIS — G629 Polyneuropathy, unspecified: Secondary | ICD-10-CM | POA: Diagnosis not present

## 2017-01-27 DIAGNOSIS — G2581 Restless legs syndrome: Secondary | ICD-10-CM | POA: Diagnosis not present

## 2017-01-27 DIAGNOSIS — M1991 Primary osteoarthritis, unspecified site: Secondary | ICD-10-CM | POA: Diagnosis not present

## 2017-01-27 DIAGNOSIS — M5416 Radiculopathy, lumbar region: Secondary | ICD-10-CM | POA: Diagnosis not present

## 2017-01-27 DIAGNOSIS — K9422 Gastrostomy infection: Secondary | ICD-10-CM | POA: Diagnosis not present

## 2017-01-28 DIAGNOSIS — G20 Parkinson's disease: Secondary | ICD-10-CM | POA: Diagnosis not present

## 2017-01-28 DIAGNOSIS — M1991 Primary osteoarthritis, unspecified site: Secondary | ICD-10-CM | POA: Diagnosis not present

## 2017-01-28 DIAGNOSIS — G629 Polyneuropathy, unspecified: Secondary | ICD-10-CM | POA: Diagnosis not present

## 2017-01-28 DIAGNOSIS — M5416 Radiculopathy, lumbar region: Secondary | ICD-10-CM | POA: Diagnosis not present

## 2017-01-28 DIAGNOSIS — G2581 Restless legs syndrome: Secondary | ICD-10-CM | POA: Diagnosis not present

## 2017-01-28 DIAGNOSIS — K9422 Gastrostomy infection: Secondary | ICD-10-CM | POA: Diagnosis not present

## 2017-01-31 DIAGNOSIS — K9422 Gastrostomy infection: Secondary | ICD-10-CM | POA: Diagnosis not present

## 2017-01-31 DIAGNOSIS — G2581 Restless legs syndrome: Secondary | ICD-10-CM | POA: Diagnosis not present

## 2017-01-31 DIAGNOSIS — G629 Polyneuropathy, unspecified: Secondary | ICD-10-CM | POA: Diagnosis not present

## 2017-01-31 DIAGNOSIS — M1991 Primary osteoarthritis, unspecified site: Secondary | ICD-10-CM | POA: Diagnosis not present

## 2017-01-31 DIAGNOSIS — M5416 Radiculopathy, lumbar region: Secondary | ICD-10-CM | POA: Diagnosis not present

## 2017-01-31 DIAGNOSIS — G20 Parkinson's disease: Secondary | ICD-10-CM | POA: Diagnosis not present

## 2017-02-02 DIAGNOSIS — J069 Acute upper respiratory infection, unspecified: Secondary | ICD-10-CM | POA: Diagnosis not present

## 2017-02-03 DIAGNOSIS — G629 Polyneuropathy, unspecified: Secondary | ICD-10-CM | POA: Diagnosis not present

## 2017-02-03 DIAGNOSIS — K9422 Gastrostomy infection: Secondary | ICD-10-CM | POA: Diagnosis not present

## 2017-02-03 DIAGNOSIS — M1991 Primary osteoarthritis, unspecified site: Secondary | ICD-10-CM | POA: Diagnosis not present

## 2017-02-03 DIAGNOSIS — M5416 Radiculopathy, lumbar region: Secondary | ICD-10-CM | POA: Diagnosis not present

## 2017-02-03 DIAGNOSIS — G2581 Restless legs syndrome: Secondary | ICD-10-CM | POA: Diagnosis not present

## 2017-02-03 DIAGNOSIS — G20 Parkinson's disease: Secondary | ICD-10-CM | POA: Diagnosis not present

## 2017-02-04 DIAGNOSIS — G20 Parkinson's disease: Secondary | ICD-10-CM | POA: Diagnosis not present

## 2017-02-04 DIAGNOSIS — M5416 Radiculopathy, lumbar region: Secondary | ICD-10-CM | POA: Diagnosis not present

## 2017-02-04 DIAGNOSIS — K9422 Gastrostomy infection: Secondary | ICD-10-CM | POA: Diagnosis not present

## 2017-02-04 DIAGNOSIS — G2581 Restless legs syndrome: Secondary | ICD-10-CM | POA: Diagnosis not present

## 2017-02-04 DIAGNOSIS — G629 Polyneuropathy, unspecified: Secondary | ICD-10-CM | POA: Diagnosis not present

## 2017-02-04 DIAGNOSIS — M1991 Primary osteoarthritis, unspecified site: Secondary | ICD-10-CM | POA: Diagnosis not present

## 2017-02-07 DIAGNOSIS — M1991 Primary osteoarthritis, unspecified site: Secondary | ICD-10-CM | POA: Diagnosis not present

## 2017-02-07 DIAGNOSIS — K9422 Gastrostomy infection: Secondary | ICD-10-CM | POA: Diagnosis not present

## 2017-02-07 DIAGNOSIS — M5416 Radiculopathy, lumbar region: Secondary | ICD-10-CM | POA: Diagnosis not present

## 2017-02-07 DIAGNOSIS — G2581 Restless legs syndrome: Secondary | ICD-10-CM | POA: Diagnosis not present

## 2017-02-07 DIAGNOSIS — G20 Parkinson's disease: Secondary | ICD-10-CM | POA: Diagnosis not present

## 2017-02-07 DIAGNOSIS — G629 Polyneuropathy, unspecified: Secondary | ICD-10-CM | POA: Diagnosis not present

## 2017-02-09 DIAGNOSIS — M5416 Radiculopathy, lumbar region: Secondary | ICD-10-CM | POA: Diagnosis not present

## 2017-02-09 DIAGNOSIS — K9422 Gastrostomy infection: Secondary | ICD-10-CM | POA: Diagnosis not present

## 2017-02-09 DIAGNOSIS — G20 Parkinson's disease: Secondary | ICD-10-CM | POA: Diagnosis not present

## 2017-02-09 DIAGNOSIS — M1991 Primary osteoarthritis, unspecified site: Secondary | ICD-10-CM | POA: Diagnosis not present

## 2017-02-09 DIAGNOSIS — G629 Polyneuropathy, unspecified: Secondary | ICD-10-CM | POA: Diagnosis not present

## 2017-02-09 DIAGNOSIS — G2581 Restless legs syndrome: Secondary | ICD-10-CM | POA: Diagnosis not present

## 2017-02-10 DIAGNOSIS — G629 Polyneuropathy, unspecified: Secondary | ICD-10-CM | POA: Diagnosis not present

## 2017-02-10 DIAGNOSIS — M1991 Primary osteoarthritis, unspecified site: Secondary | ICD-10-CM | POA: Diagnosis not present

## 2017-02-10 DIAGNOSIS — K9422 Gastrostomy infection: Secondary | ICD-10-CM | POA: Diagnosis not present

## 2017-02-10 DIAGNOSIS — M5416 Radiculopathy, lumbar region: Secondary | ICD-10-CM | POA: Diagnosis not present

## 2017-02-10 DIAGNOSIS — G2581 Restless legs syndrome: Secondary | ICD-10-CM | POA: Diagnosis not present

## 2017-02-10 DIAGNOSIS — G20 Parkinson's disease: Secondary | ICD-10-CM | POA: Diagnosis not present

## 2017-02-11 DIAGNOSIS — K9422 Gastrostomy infection: Secondary | ICD-10-CM | POA: Diagnosis not present

## 2017-02-11 DIAGNOSIS — G629 Polyneuropathy, unspecified: Secondary | ICD-10-CM | POA: Diagnosis not present

## 2017-02-11 DIAGNOSIS — M5416 Radiculopathy, lumbar region: Secondary | ICD-10-CM | POA: Diagnosis not present

## 2017-02-11 DIAGNOSIS — G20 Parkinson's disease: Secondary | ICD-10-CM | POA: Diagnosis not present

## 2017-02-11 DIAGNOSIS — G2581 Restless legs syndrome: Secondary | ICD-10-CM | POA: Diagnosis not present

## 2017-02-11 DIAGNOSIS — M1991 Primary osteoarthritis, unspecified site: Secondary | ICD-10-CM | POA: Diagnosis not present

## 2017-02-14 DIAGNOSIS — M1991 Primary osteoarthritis, unspecified site: Secondary | ICD-10-CM | POA: Diagnosis not present

## 2017-02-14 DIAGNOSIS — G20 Parkinson's disease: Secondary | ICD-10-CM | POA: Diagnosis not present

## 2017-02-14 DIAGNOSIS — G2581 Restless legs syndrome: Secondary | ICD-10-CM | POA: Diagnosis not present

## 2017-02-14 DIAGNOSIS — K9422 Gastrostomy infection: Secondary | ICD-10-CM | POA: Diagnosis not present

## 2017-02-14 DIAGNOSIS — G629 Polyneuropathy, unspecified: Secondary | ICD-10-CM | POA: Diagnosis not present

## 2017-02-14 DIAGNOSIS — M5416 Radiculopathy, lumbar region: Secondary | ICD-10-CM | POA: Diagnosis not present

## 2017-02-16 DIAGNOSIS — G629 Polyneuropathy, unspecified: Secondary | ICD-10-CM | POA: Diagnosis not present

## 2017-02-16 DIAGNOSIS — M1991 Primary osteoarthritis, unspecified site: Secondary | ICD-10-CM | POA: Diagnosis not present

## 2017-02-16 DIAGNOSIS — G2581 Restless legs syndrome: Secondary | ICD-10-CM | POA: Diagnosis not present

## 2017-02-16 DIAGNOSIS — G20 Parkinson's disease: Secondary | ICD-10-CM | POA: Diagnosis not present

## 2017-02-16 DIAGNOSIS — K9422 Gastrostomy infection: Secondary | ICD-10-CM | POA: Diagnosis not present

## 2017-02-16 DIAGNOSIS — M5416 Radiculopathy, lumbar region: Secondary | ICD-10-CM | POA: Diagnosis not present

## 2017-02-17 DIAGNOSIS — K9422 Gastrostomy infection: Secondary | ICD-10-CM | POA: Diagnosis not present

## 2017-02-17 DIAGNOSIS — M5416 Radiculopathy, lumbar region: Secondary | ICD-10-CM | POA: Diagnosis not present

## 2017-02-17 DIAGNOSIS — G20 Parkinson's disease: Secondary | ICD-10-CM | POA: Diagnosis not present

## 2017-02-17 DIAGNOSIS — G629 Polyneuropathy, unspecified: Secondary | ICD-10-CM | POA: Diagnosis not present

## 2017-02-17 DIAGNOSIS — G2581 Restless legs syndrome: Secondary | ICD-10-CM | POA: Diagnosis not present

## 2017-02-17 DIAGNOSIS — M1991 Primary osteoarthritis, unspecified site: Secondary | ICD-10-CM | POA: Diagnosis not present

## 2017-02-18 DIAGNOSIS — G2581 Restless legs syndrome: Secondary | ICD-10-CM | POA: Diagnosis not present

## 2017-02-18 DIAGNOSIS — M5416 Radiculopathy, lumbar region: Secondary | ICD-10-CM | POA: Diagnosis not present

## 2017-02-18 DIAGNOSIS — M1991 Primary osteoarthritis, unspecified site: Secondary | ICD-10-CM | POA: Diagnosis not present

## 2017-02-18 DIAGNOSIS — G20 Parkinson's disease: Secondary | ICD-10-CM | POA: Diagnosis not present

## 2017-02-18 DIAGNOSIS — K9422 Gastrostomy infection: Secondary | ICD-10-CM | POA: Diagnosis not present

## 2017-02-18 DIAGNOSIS — G629 Polyneuropathy, unspecified: Secondary | ICD-10-CM | POA: Diagnosis not present

## 2017-02-21 DIAGNOSIS — K9422 Gastrostomy infection: Secondary | ICD-10-CM | POA: Diagnosis not present

## 2017-02-21 DIAGNOSIS — M1991 Primary osteoarthritis, unspecified site: Secondary | ICD-10-CM | POA: Diagnosis not present

## 2017-02-21 DIAGNOSIS — G20 Parkinson's disease: Secondary | ICD-10-CM | POA: Diagnosis not present

## 2017-02-21 DIAGNOSIS — G2581 Restless legs syndrome: Secondary | ICD-10-CM | POA: Diagnosis not present

## 2017-02-21 DIAGNOSIS — M5416 Radiculopathy, lumbar region: Secondary | ICD-10-CM | POA: Diagnosis not present

## 2017-02-21 DIAGNOSIS — G629 Polyneuropathy, unspecified: Secondary | ICD-10-CM | POA: Diagnosis not present

## 2017-02-22 DIAGNOSIS — G20 Parkinson's disease: Secondary | ICD-10-CM | POA: Diagnosis not present

## 2017-02-22 DIAGNOSIS — Z931 Gastrostomy status: Secondary | ICD-10-CM | POA: Diagnosis not present

## 2017-02-22 DIAGNOSIS — G629 Polyneuropathy, unspecified: Secondary | ICD-10-CM | POA: Diagnosis not present

## 2017-02-22 DIAGNOSIS — M1991 Primary osteoarthritis, unspecified site: Secondary | ICD-10-CM | POA: Diagnosis not present

## 2017-02-22 DIAGNOSIS — K9422 Gastrostomy infection: Secondary | ICD-10-CM | POA: Diagnosis not present

## 2017-02-22 DIAGNOSIS — G2581 Restless legs syndrome: Secondary | ICD-10-CM | POA: Diagnosis not present

## 2017-02-22 DIAGNOSIS — M5416 Radiculopathy, lumbar region: Secondary | ICD-10-CM | POA: Diagnosis not present

## 2017-02-22 DIAGNOSIS — R5383 Other fatigue: Secondary | ICD-10-CM | POA: Diagnosis not present

## 2017-02-23 DIAGNOSIS — M1991 Primary osteoarthritis, unspecified site: Secondary | ICD-10-CM | POA: Diagnosis not present

## 2017-02-23 DIAGNOSIS — G629 Polyneuropathy, unspecified: Secondary | ICD-10-CM | POA: Diagnosis not present

## 2017-02-23 DIAGNOSIS — G2581 Restless legs syndrome: Secondary | ICD-10-CM | POA: Diagnosis not present

## 2017-02-23 DIAGNOSIS — K9422 Gastrostomy infection: Secondary | ICD-10-CM | POA: Diagnosis not present

## 2017-02-23 DIAGNOSIS — G20 Parkinson's disease: Secondary | ICD-10-CM | POA: Diagnosis not present

## 2017-02-23 DIAGNOSIS — M5416 Radiculopathy, lumbar region: Secondary | ICD-10-CM | POA: Diagnosis not present

## 2017-02-25 DIAGNOSIS — G629 Polyneuropathy, unspecified: Secondary | ICD-10-CM | POA: Diagnosis not present

## 2017-02-25 DIAGNOSIS — G2581 Restless legs syndrome: Secondary | ICD-10-CM | POA: Diagnosis not present

## 2017-02-25 DIAGNOSIS — G20 Parkinson's disease: Secondary | ICD-10-CM | POA: Diagnosis not present

## 2017-02-25 DIAGNOSIS — M5416 Radiculopathy, lumbar region: Secondary | ICD-10-CM | POA: Diagnosis not present

## 2017-02-25 DIAGNOSIS — M1991 Primary osteoarthritis, unspecified site: Secondary | ICD-10-CM | POA: Diagnosis not present

## 2017-02-25 DIAGNOSIS — K9422 Gastrostomy infection: Secondary | ICD-10-CM | POA: Diagnosis not present

## 2017-02-28 DIAGNOSIS — M5416 Radiculopathy, lumbar region: Secondary | ICD-10-CM | POA: Diagnosis not present

## 2017-02-28 DIAGNOSIS — G2581 Restless legs syndrome: Secondary | ICD-10-CM | POA: Diagnosis not present

## 2017-02-28 DIAGNOSIS — G629 Polyneuropathy, unspecified: Secondary | ICD-10-CM | POA: Diagnosis not present

## 2017-02-28 DIAGNOSIS — G20 Parkinson's disease: Secondary | ICD-10-CM | POA: Diagnosis not present

## 2017-02-28 DIAGNOSIS — K9422 Gastrostomy infection: Secondary | ICD-10-CM | POA: Diagnosis not present

## 2017-02-28 DIAGNOSIS — M1991 Primary osteoarthritis, unspecified site: Secondary | ICD-10-CM | POA: Diagnosis not present

## 2017-03-01 DIAGNOSIS — I951 Orthostatic hypotension: Secondary | ICD-10-CM | POA: Diagnosis not present

## 2017-03-01 DIAGNOSIS — M1991 Primary osteoarthritis, unspecified site: Secondary | ICD-10-CM | POA: Diagnosis not present

## 2017-03-01 DIAGNOSIS — R5383 Other fatigue: Secondary | ICD-10-CM | POA: Diagnosis not present

## 2017-03-01 DIAGNOSIS — G2581 Restless legs syndrome: Secondary | ICD-10-CM | POA: Diagnosis not present

## 2017-03-01 DIAGNOSIS — Z79899 Other long term (current) drug therapy: Secondary | ICD-10-CM | POA: Diagnosis not present

## 2017-03-01 DIAGNOSIS — M5416 Radiculopathy, lumbar region: Secondary | ICD-10-CM | POA: Diagnosis not present

## 2017-03-01 DIAGNOSIS — G629 Polyneuropathy, unspecified: Secondary | ICD-10-CM | POA: Diagnosis not present

## 2017-03-01 DIAGNOSIS — K9422 Gastrostomy infection: Secondary | ICD-10-CM | POA: Diagnosis not present

## 2017-03-01 DIAGNOSIS — G20 Parkinson's disease: Secondary | ICD-10-CM | POA: Diagnosis not present

## 2017-03-01 DIAGNOSIS — Z978 Presence of other specified devices: Secondary | ICD-10-CM | POA: Diagnosis not present

## 2017-03-02 DIAGNOSIS — M1991 Primary osteoarthritis, unspecified site: Secondary | ICD-10-CM | POA: Diagnosis not present

## 2017-03-02 DIAGNOSIS — M5416 Radiculopathy, lumbar region: Secondary | ICD-10-CM | POA: Diagnosis not present

## 2017-03-02 DIAGNOSIS — K9422 Gastrostomy infection: Secondary | ICD-10-CM | POA: Diagnosis not present

## 2017-03-02 DIAGNOSIS — G2581 Restless legs syndrome: Secondary | ICD-10-CM | POA: Diagnosis not present

## 2017-03-02 DIAGNOSIS — G20 Parkinson's disease: Secondary | ICD-10-CM | POA: Diagnosis not present

## 2017-03-02 DIAGNOSIS — G629 Polyneuropathy, unspecified: Secondary | ICD-10-CM | POA: Diagnosis not present

## 2017-03-03 DIAGNOSIS — M5416 Radiculopathy, lumbar region: Secondary | ICD-10-CM | POA: Diagnosis not present

## 2017-03-03 DIAGNOSIS — G629 Polyneuropathy, unspecified: Secondary | ICD-10-CM | POA: Diagnosis not present

## 2017-03-03 DIAGNOSIS — K9422 Gastrostomy infection: Secondary | ICD-10-CM | POA: Diagnosis not present

## 2017-03-03 DIAGNOSIS — M1991 Primary osteoarthritis, unspecified site: Secondary | ICD-10-CM | POA: Diagnosis not present

## 2017-03-03 DIAGNOSIS — G20 Parkinson's disease: Secondary | ICD-10-CM | POA: Diagnosis not present

## 2017-03-03 DIAGNOSIS — G2581 Restless legs syndrome: Secondary | ICD-10-CM | POA: Diagnosis not present

## 2017-03-21 ENCOUNTER — Other Ambulatory Visit: Payer: Self-pay | Admitting: Family Medicine

## 2017-03-21 ENCOUNTER — Ambulatory Visit
Admission: RE | Admit: 2017-03-21 | Discharge: 2017-03-21 | Disposition: A | Discharge status: Home / Self Care | Payer: Medicare Other | Source: Ambulatory Visit | Attending: Family Medicine | Admitting: Family Medicine

## 2017-03-21 DIAGNOSIS — R600 Localized edema: Secondary | ICD-10-CM

## 2017-03-21 DIAGNOSIS — J90 Pleural effusion, not elsewhere classified: Secondary | ICD-10-CM | POA: Diagnosis not present

## 2017-03-22 DIAGNOSIS — K769 Liver disease, unspecified: Secondary | ICD-10-CM | POA: Diagnosis not present

## 2017-03-22 DIAGNOSIS — M4804 Spinal stenosis, thoracic region: Secondary | ICD-10-CM | POA: Diagnosis not present

## 2017-03-22 DIAGNOSIS — Q2739 Arteriovenous malformation, other site: Secondary | ICD-10-CM | POA: Diagnosis not present

## 2017-03-22 DIAGNOSIS — D1809 Hemangioma of other sites: Secondary | ICD-10-CM | POA: Diagnosis not present

## 2017-03-24 DIAGNOSIS — G8929 Other chronic pain: Secondary | ICD-10-CM | POA: Diagnosis not present

## 2017-03-24 DIAGNOSIS — Z880 Allergy status to penicillin: Secondary | ICD-10-CM | POA: Diagnosis not present

## 2017-03-24 DIAGNOSIS — M5441 Lumbago with sciatica, right side: Secondary | ICD-10-CM | POA: Diagnosis not present

## 2017-03-24 DIAGNOSIS — D1809 Hemangioma of other sites: Secondary | ICD-10-CM | POA: Diagnosis not present

## 2017-04-01 DIAGNOSIS — E559 Vitamin D deficiency, unspecified: Secondary | ICD-10-CM | POA: Diagnosis not present

## 2017-04-01 DIAGNOSIS — I10 Essential (primary) hypertension: Secondary | ICD-10-CM | POA: Diagnosis not present

## 2017-04-01 DIAGNOSIS — R252 Cramp and spasm: Secondary | ICD-10-CM | POA: Diagnosis not present

## 2017-04-01 DIAGNOSIS — I2510 Atherosclerotic heart disease of native coronary artery without angina pectoris: Secondary | ICD-10-CM | POA: Diagnosis not present

## 2017-04-01 DIAGNOSIS — E782 Mixed hyperlipidemia: Secondary | ICD-10-CM | POA: Diagnosis not present

## 2017-04-01 DIAGNOSIS — G20 Parkinson's disease: Secondary | ICD-10-CM | POA: Diagnosis not present

## 2017-04-01 DIAGNOSIS — F3341 Major depressive disorder, recurrent, in partial remission: Secondary | ICD-10-CM | POA: Diagnosis not present

## 2017-04-13 DIAGNOSIS — Z431 Encounter for attention to gastrostomy: Secondary | ICD-10-CM | POA: Diagnosis not present

## 2017-04-13 DIAGNOSIS — G20 Parkinson's disease: Secondary | ICD-10-CM | POA: Diagnosis not present

## 2017-04-13 DIAGNOSIS — T188XXA Foreign body in other parts of alimentary tract, initial encounter: Secondary | ICD-10-CM | POA: Diagnosis not present

## 2017-04-13 DIAGNOSIS — Z4659 Encounter for fitting and adjustment of other gastrointestinal appliance and device: Secondary | ICD-10-CM | POA: Diagnosis not present

## 2017-04-20 DIAGNOSIS — D1809 Hemangioma of other sites: Secondary | ICD-10-CM | POA: Diagnosis not present

## 2017-04-20 DIAGNOSIS — G20 Parkinson's disease: Secondary | ICD-10-CM | POA: Diagnosis not present

## 2017-04-20 DIAGNOSIS — I2510 Atherosclerotic heart disease of native coronary artery without angina pectoris: Secondary | ICD-10-CM | POA: Diagnosis not present

## 2017-04-20 DIAGNOSIS — M1991 Primary osteoarthritis, unspecified site: Secondary | ICD-10-CM | POA: Diagnosis not present

## 2017-04-20 DIAGNOSIS — I10 Essential (primary) hypertension: Secondary | ICD-10-CM | POA: Diagnosis not present

## 2017-04-20 DIAGNOSIS — M5441 Lumbago with sciatica, right side: Secondary | ICD-10-CM | POA: Diagnosis not present

## 2017-04-27 DIAGNOSIS — G4733 Obstructive sleep apnea (adult) (pediatric): Secondary | ICD-10-CM | POA: Diagnosis not present

## 2017-04-29 DIAGNOSIS — I2510 Atherosclerotic heart disease of native coronary artery without angina pectoris: Secondary | ICD-10-CM | POA: Diagnosis not present

## 2017-04-29 DIAGNOSIS — M5441 Lumbago with sciatica, right side: Secondary | ICD-10-CM | POA: Diagnosis not present

## 2017-04-29 DIAGNOSIS — D1809 Hemangioma of other sites: Secondary | ICD-10-CM | POA: Diagnosis not present

## 2017-04-29 DIAGNOSIS — M1991 Primary osteoarthritis, unspecified site: Secondary | ICD-10-CM | POA: Diagnosis not present

## 2017-04-29 DIAGNOSIS — I10 Essential (primary) hypertension: Secondary | ICD-10-CM | POA: Diagnosis not present

## 2017-04-29 DIAGNOSIS — G20 Parkinson's disease: Secondary | ICD-10-CM | POA: Diagnosis not present

## 2017-05-03 DIAGNOSIS — M1991 Primary osteoarthritis, unspecified site: Secondary | ICD-10-CM | POA: Diagnosis not present

## 2017-05-03 DIAGNOSIS — G20 Parkinson's disease: Secondary | ICD-10-CM | POA: Diagnosis not present

## 2017-05-03 DIAGNOSIS — D1809 Hemangioma of other sites: Secondary | ICD-10-CM | POA: Diagnosis not present

## 2017-05-03 DIAGNOSIS — M5441 Lumbago with sciatica, right side: Secondary | ICD-10-CM | POA: Diagnosis not present

## 2017-05-03 DIAGNOSIS — I2510 Atherosclerotic heart disease of native coronary artery without angina pectoris: Secondary | ICD-10-CM | POA: Diagnosis not present

## 2017-05-03 DIAGNOSIS — I10 Essential (primary) hypertension: Secondary | ICD-10-CM | POA: Diagnosis not present

## 2017-05-06 DIAGNOSIS — G20 Parkinson's disease: Secondary | ICD-10-CM | POA: Diagnosis not present

## 2017-05-06 DIAGNOSIS — M1991 Primary osteoarthritis, unspecified site: Secondary | ICD-10-CM | POA: Diagnosis not present

## 2017-05-06 DIAGNOSIS — M5441 Lumbago with sciatica, right side: Secondary | ICD-10-CM | POA: Diagnosis not present

## 2017-05-06 DIAGNOSIS — I10 Essential (primary) hypertension: Secondary | ICD-10-CM | POA: Diagnosis not present

## 2017-05-06 DIAGNOSIS — I2510 Atherosclerotic heart disease of native coronary artery without angina pectoris: Secondary | ICD-10-CM | POA: Diagnosis not present

## 2017-05-06 DIAGNOSIS — D1809 Hemangioma of other sites: Secondary | ICD-10-CM | POA: Diagnosis not present

## 2017-05-11 DIAGNOSIS — D1809 Hemangioma of other sites: Secondary | ICD-10-CM | POA: Diagnosis not present

## 2017-05-11 DIAGNOSIS — I2510 Atherosclerotic heart disease of native coronary artery without angina pectoris: Secondary | ICD-10-CM | POA: Diagnosis not present

## 2017-05-11 DIAGNOSIS — I10 Essential (primary) hypertension: Secondary | ICD-10-CM | POA: Diagnosis not present

## 2017-05-11 DIAGNOSIS — G20 Parkinson's disease: Secondary | ICD-10-CM | POA: Diagnosis not present

## 2017-05-11 DIAGNOSIS — M5441 Lumbago with sciatica, right side: Secondary | ICD-10-CM | POA: Diagnosis not present

## 2017-05-11 DIAGNOSIS — M1991 Primary osteoarthritis, unspecified site: Secondary | ICD-10-CM | POA: Diagnosis not present

## 2017-05-13 DIAGNOSIS — G20 Parkinson's disease: Secondary | ICD-10-CM | POA: Diagnosis not present

## 2017-05-13 DIAGNOSIS — I10 Essential (primary) hypertension: Secondary | ICD-10-CM | POA: Diagnosis not present

## 2017-05-13 DIAGNOSIS — D1809 Hemangioma of other sites: Secondary | ICD-10-CM | POA: Diagnosis not present

## 2017-05-13 DIAGNOSIS — M1991 Primary osteoarthritis, unspecified site: Secondary | ICD-10-CM | POA: Diagnosis not present

## 2017-05-13 DIAGNOSIS — M5441 Lumbago with sciatica, right side: Secondary | ICD-10-CM | POA: Diagnosis not present

## 2017-05-13 DIAGNOSIS — I2510 Atherosclerotic heart disease of native coronary artery without angina pectoris: Secondary | ICD-10-CM | POA: Diagnosis not present

## 2017-05-17 DIAGNOSIS — G20 Parkinson's disease: Secondary | ICD-10-CM | POA: Diagnosis not present

## 2017-05-17 DIAGNOSIS — R4589 Other symptoms and signs involving emotional state: Secondary | ICD-10-CM | POA: Diagnosis not present

## 2017-05-17 DIAGNOSIS — Z79899 Other long term (current) drug therapy: Secondary | ICD-10-CM | POA: Diagnosis not present

## 2017-05-17 DIAGNOSIS — F329 Major depressive disorder, single episode, unspecified: Secondary | ICD-10-CM | POA: Diagnosis not present

## 2017-05-18 DIAGNOSIS — I2510 Atherosclerotic heart disease of native coronary artery without angina pectoris: Secondary | ICD-10-CM | POA: Diagnosis not present

## 2017-05-18 DIAGNOSIS — G20 Parkinson's disease: Secondary | ICD-10-CM | POA: Diagnosis not present

## 2017-05-18 DIAGNOSIS — M5441 Lumbago with sciatica, right side: Secondary | ICD-10-CM | POA: Diagnosis not present

## 2017-05-18 DIAGNOSIS — I10 Essential (primary) hypertension: Secondary | ICD-10-CM | POA: Diagnosis not present

## 2017-05-18 DIAGNOSIS — D1809 Hemangioma of other sites: Secondary | ICD-10-CM | POA: Diagnosis not present

## 2017-05-18 DIAGNOSIS — M1991 Primary osteoarthritis, unspecified site: Secondary | ICD-10-CM | POA: Diagnosis not present

## 2017-05-20 DIAGNOSIS — G20 Parkinson's disease: Secondary | ICD-10-CM | POA: Diagnosis not present

## 2017-05-20 DIAGNOSIS — D1809 Hemangioma of other sites: Secondary | ICD-10-CM | POA: Diagnosis not present

## 2017-05-20 DIAGNOSIS — I2510 Atherosclerotic heart disease of native coronary artery without angina pectoris: Secondary | ICD-10-CM | POA: Diagnosis not present

## 2017-05-20 DIAGNOSIS — M1991 Primary osteoarthritis, unspecified site: Secondary | ICD-10-CM | POA: Diagnosis not present

## 2017-05-20 DIAGNOSIS — I10 Essential (primary) hypertension: Secondary | ICD-10-CM | POA: Diagnosis not present

## 2017-05-20 DIAGNOSIS — M5441 Lumbago with sciatica, right side: Secondary | ICD-10-CM | POA: Diagnosis not present

## 2017-05-25 DIAGNOSIS — D1809 Hemangioma of other sites: Secondary | ICD-10-CM | POA: Diagnosis not present

## 2017-05-25 DIAGNOSIS — I2510 Atherosclerotic heart disease of native coronary artery without angina pectoris: Secondary | ICD-10-CM | POA: Diagnosis not present

## 2017-05-25 DIAGNOSIS — M5441 Lumbago with sciatica, right side: Secondary | ICD-10-CM | POA: Diagnosis not present

## 2017-05-25 DIAGNOSIS — M1991 Primary osteoarthritis, unspecified site: Secondary | ICD-10-CM | POA: Diagnosis not present

## 2017-05-25 DIAGNOSIS — G20 Parkinson's disease: Secondary | ICD-10-CM | POA: Diagnosis not present

## 2017-05-25 DIAGNOSIS — I10 Essential (primary) hypertension: Secondary | ICD-10-CM | POA: Diagnosis not present

## 2017-05-27 DIAGNOSIS — D1809 Hemangioma of other sites: Secondary | ICD-10-CM | POA: Diagnosis not present

## 2017-05-27 DIAGNOSIS — G20 Parkinson's disease: Secondary | ICD-10-CM | POA: Diagnosis not present

## 2017-05-27 DIAGNOSIS — M5441 Lumbago with sciatica, right side: Secondary | ICD-10-CM | POA: Diagnosis not present

## 2017-05-27 DIAGNOSIS — I2510 Atherosclerotic heart disease of native coronary artery without angina pectoris: Secondary | ICD-10-CM | POA: Diagnosis not present

## 2017-05-27 DIAGNOSIS — I10 Essential (primary) hypertension: Secondary | ICD-10-CM | POA: Diagnosis not present

## 2017-05-27 DIAGNOSIS — M1991 Primary osteoarthritis, unspecified site: Secondary | ICD-10-CM | POA: Diagnosis not present

## 2017-05-30 DIAGNOSIS — I10 Essential (primary) hypertension: Secondary | ICD-10-CM | POA: Diagnosis not present

## 2017-05-30 DIAGNOSIS — I2510 Atherosclerotic heart disease of native coronary artery without angina pectoris: Secondary | ICD-10-CM | POA: Diagnosis not present

## 2017-05-30 DIAGNOSIS — M1991 Primary osteoarthritis, unspecified site: Secondary | ICD-10-CM | POA: Diagnosis not present

## 2017-05-30 DIAGNOSIS — M5441 Lumbago with sciatica, right side: Secondary | ICD-10-CM | POA: Diagnosis not present

## 2017-05-30 DIAGNOSIS — G20 Parkinson's disease: Secondary | ICD-10-CM | POA: Diagnosis not present

## 2017-05-30 DIAGNOSIS — D1809 Hemangioma of other sites: Secondary | ICD-10-CM | POA: Diagnosis not present

## 2017-06-07 DIAGNOSIS — D1809 Hemangioma of other sites: Secondary | ICD-10-CM | POA: Diagnosis not present

## 2017-06-07 DIAGNOSIS — M1991 Primary osteoarthritis, unspecified site: Secondary | ICD-10-CM | POA: Diagnosis not present

## 2017-06-07 DIAGNOSIS — I10 Essential (primary) hypertension: Secondary | ICD-10-CM | POA: Diagnosis not present

## 2017-06-07 DIAGNOSIS — M5441 Lumbago with sciatica, right side: Secondary | ICD-10-CM | POA: Diagnosis not present

## 2017-06-07 DIAGNOSIS — I2510 Atherosclerotic heart disease of native coronary artery without angina pectoris: Secondary | ICD-10-CM | POA: Diagnosis not present

## 2017-06-07 DIAGNOSIS — G20 Parkinson's disease: Secondary | ICD-10-CM | POA: Diagnosis not present

## 2017-06-08 DIAGNOSIS — I10 Essential (primary) hypertension: Secondary | ICD-10-CM | POA: Diagnosis not present

## 2017-06-08 DIAGNOSIS — D1809 Hemangioma of other sites: Secondary | ICD-10-CM | POA: Diagnosis not present

## 2017-06-08 DIAGNOSIS — F329 Major depressive disorder, single episode, unspecified: Secondary | ICD-10-CM | POA: Diagnosis not present

## 2017-06-08 DIAGNOSIS — G20 Parkinson's disease: Secondary | ICD-10-CM | POA: Diagnosis not present

## 2017-06-08 DIAGNOSIS — M5441 Lumbago with sciatica, right side: Secondary | ICD-10-CM | POA: Diagnosis not present

## 2017-06-08 DIAGNOSIS — M1991 Primary osteoarthritis, unspecified site: Secondary | ICD-10-CM | POA: Diagnosis not present

## 2017-06-08 DIAGNOSIS — L989 Disorder of the skin and subcutaneous tissue, unspecified: Secondary | ICD-10-CM | POA: Diagnosis not present

## 2017-06-08 DIAGNOSIS — R600 Localized edema: Secondary | ICD-10-CM | POA: Diagnosis not present

## 2017-06-08 DIAGNOSIS — I2510 Atherosclerotic heart disease of native coronary artery without angina pectoris: Secondary | ICD-10-CM | POA: Diagnosis not present

## 2017-06-10 ENCOUNTER — Other Ambulatory Visit: Payer: Self-pay

## 2017-06-10 DIAGNOSIS — R600 Localized edema: Secondary | ICD-10-CM

## 2017-06-14 DIAGNOSIS — I2510 Atherosclerotic heart disease of native coronary artery without angina pectoris: Secondary | ICD-10-CM | POA: Diagnosis not present

## 2017-06-14 DIAGNOSIS — M1991 Primary osteoarthritis, unspecified site: Secondary | ICD-10-CM | POA: Diagnosis not present

## 2017-06-14 DIAGNOSIS — I10 Essential (primary) hypertension: Secondary | ICD-10-CM | POA: Diagnosis not present

## 2017-06-14 DIAGNOSIS — D1809 Hemangioma of other sites: Secondary | ICD-10-CM | POA: Diagnosis not present

## 2017-06-14 DIAGNOSIS — G20 Parkinson's disease: Secondary | ICD-10-CM | POA: Diagnosis not present

## 2017-06-14 DIAGNOSIS — M5441 Lumbago with sciatica, right side: Secondary | ICD-10-CM | POA: Diagnosis not present

## 2017-06-16 DIAGNOSIS — M5441 Lumbago with sciatica, right side: Secondary | ICD-10-CM | POA: Diagnosis not present

## 2017-06-16 DIAGNOSIS — M1991 Primary osteoarthritis, unspecified site: Secondary | ICD-10-CM | POA: Diagnosis not present

## 2017-06-16 DIAGNOSIS — I2510 Atherosclerotic heart disease of native coronary artery without angina pectoris: Secondary | ICD-10-CM | POA: Diagnosis not present

## 2017-06-16 DIAGNOSIS — G20 Parkinson's disease: Secondary | ICD-10-CM | POA: Diagnosis not present

## 2017-06-16 DIAGNOSIS — I10 Essential (primary) hypertension: Secondary | ICD-10-CM | POA: Diagnosis not present

## 2017-06-16 DIAGNOSIS — D1809 Hemangioma of other sites: Secondary | ICD-10-CM | POA: Diagnosis not present

## 2017-06-19 DIAGNOSIS — M5441 Lumbago with sciatica, right side: Secondary | ICD-10-CM | POA: Diagnosis not present

## 2017-06-19 DIAGNOSIS — M1991 Primary osteoarthritis, unspecified site: Secondary | ICD-10-CM | POA: Diagnosis not present

## 2017-06-19 DIAGNOSIS — I2510 Atherosclerotic heart disease of native coronary artery without angina pectoris: Secondary | ICD-10-CM | POA: Diagnosis not present

## 2017-06-19 DIAGNOSIS — G20 Parkinson's disease: Secondary | ICD-10-CM | POA: Diagnosis not present

## 2017-06-19 DIAGNOSIS — I10 Essential (primary) hypertension: Secondary | ICD-10-CM | POA: Diagnosis not present

## 2017-06-19 DIAGNOSIS — D1809 Hemangioma of other sites: Secondary | ICD-10-CM | POA: Diagnosis not present

## 2017-06-21 DIAGNOSIS — M1991 Primary osteoarthritis, unspecified site: Secondary | ICD-10-CM | POA: Diagnosis not present

## 2017-06-21 DIAGNOSIS — M5441 Lumbago with sciatica, right side: Secondary | ICD-10-CM | POA: Diagnosis not present

## 2017-06-21 DIAGNOSIS — G20 Parkinson's disease: Secondary | ICD-10-CM | POA: Diagnosis not present

## 2017-06-21 DIAGNOSIS — I2510 Atherosclerotic heart disease of native coronary artery without angina pectoris: Secondary | ICD-10-CM | POA: Diagnosis not present

## 2017-06-21 DIAGNOSIS — I10 Essential (primary) hypertension: Secondary | ICD-10-CM | POA: Diagnosis not present

## 2017-06-21 DIAGNOSIS — D1809 Hemangioma of other sites: Secondary | ICD-10-CM | POA: Diagnosis not present

## 2017-06-23 DIAGNOSIS — I10 Essential (primary) hypertension: Secondary | ICD-10-CM | POA: Diagnosis not present

## 2017-06-23 DIAGNOSIS — G20 Parkinson's disease: Secondary | ICD-10-CM | POA: Diagnosis not present

## 2017-06-23 DIAGNOSIS — M1991 Primary osteoarthritis, unspecified site: Secondary | ICD-10-CM | POA: Diagnosis not present

## 2017-06-23 DIAGNOSIS — I2510 Atherosclerotic heart disease of native coronary artery without angina pectoris: Secondary | ICD-10-CM | POA: Diagnosis not present

## 2017-06-23 DIAGNOSIS — M5441 Lumbago with sciatica, right side: Secondary | ICD-10-CM | POA: Diagnosis not present

## 2017-06-23 DIAGNOSIS — D1809 Hemangioma of other sites: Secondary | ICD-10-CM | POA: Diagnosis not present

## 2017-06-28 DIAGNOSIS — G20 Parkinson's disease: Secondary | ICD-10-CM | POA: Diagnosis not present

## 2017-06-28 DIAGNOSIS — M1991 Primary osteoarthritis, unspecified site: Secondary | ICD-10-CM | POA: Diagnosis not present

## 2017-06-28 DIAGNOSIS — M5441 Lumbago with sciatica, right side: Secondary | ICD-10-CM | POA: Diagnosis not present

## 2017-06-28 DIAGNOSIS — I10 Essential (primary) hypertension: Secondary | ICD-10-CM | POA: Diagnosis not present

## 2017-06-28 DIAGNOSIS — D1809 Hemangioma of other sites: Secondary | ICD-10-CM | POA: Diagnosis not present

## 2017-06-28 DIAGNOSIS — I2510 Atherosclerotic heart disease of native coronary artery without angina pectoris: Secondary | ICD-10-CM | POA: Diagnosis not present

## 2017-06-29 DIAGNOSIS — I2510 Atherosclerotic heart disease of native coronary artery without angina pectoris: Secondary | ICD-10-CM | POA: Diagnosis not present

## 2017-06-29 DIAGNOSIS — D1809 Hemangioma of other sites: Secondary | ICD-10-CM | POA: Diagnosis not present

## 2017-06-29 DIAGNOSIS — G20 Parkinson's disease: Secondary | ICD-10-CM | POA: Diagnosis not present

## 2017-06-29 DIAGNOSIS — G4733 Obstructive sleep apnea (adult) (pediatric): Secondary | ICD-10-CM | POA: Diagnosis not present

## 2017-06-29 DIAGNOSIS — I10 Essential (primary) hypertension: Secondary | ICD-10-CM | POA: Diagnosis not present

## 2017-06-29 DIAGNOSIS — M5441 Lumbago with sciatica, right side: Secondary | ICD-10-CM | POA: Diagnosis not present

## 2017-06-29 DIAGNOSIS — M1991 Primary osteoarthritis, unspecified site: Secondary | ICD-10-CM | POA: Diagnosis not present

## 2017-06-30 DIAGNOSIS — I2510 Atherosclerotic heart disease of native coronary artery without angina pectoris: Secondary | ICD-10-CM | POA: Diagnosis not present

## 2017-06-30 DIAGNOSIS — I10 Essential (primary) hypertension: Secondary | ICD-10-CM | POA: Diagnosis not present

## 2017-06-30 DIAGNOSIS — M1991 Primary osteoarthritis, unspecified site: Secondary | ICD-10-CM | POA: Diagnosis not present

## 2017-06-30 DIAGNOSIS — G20 Parkinson's disease: Secondary | ICD-10-CM | POA: Diagnosis not present

## 2017-06-30 DIAGNOSIS — D1809 Hemangioma of other sites: Secondary | ICD-10-CM | POA: Diagnosis not present

## 2017-06-30 DIAGNOSIS — M5441 Lumbago with sciatica, right side: Secondary | ICD-10-CM | POA: Diagnosis not present

## 2017-07-01 DIAGNOSIS — I10 Essential (primary) hypertension: Secondary | ICD-10-CM | POA: Diagnosis not present

## 2017-07-01 DIAGNOSIS — M1991 Primary osteoarthritis, unspecified site: Secondary | ICD-10-CM | POA: Diagnosis not present

## 2017-07-01 DIAGNOSIS — G20 Parkinson's disease: Secondary | ICD-10-CM | POA: Diagnosis not present

## 2017-07-01 DIAGNOSIS — M5441 Lumbago with sciatica, right side: Secondary | ICD-10-CM | POA: Diagnosis not present

## 2017-07-01 DIAGNOSIS — D1809 Hemangioma of other sites: Secondary | ICD-10-CM | POA: Diagnosis not present

## 2017-07-01 DIAGNOSIS — I2510 Atherosclerotic heart disease of native coronary artery without angina pectoris: Secondary | ICD-10-CM | POA: Diagnosis not present

## 2017-07-04 DIAGNOSIS — I2510 Atherosclerotic heart disease of native coronary artery without angina pectoris: Secondary | ICD-10-CM | POA: Diagnosis not present

## 2017-07-04 DIAGNOSIS — I10 Essential (primary) hypertension: Secondary | ICD-10-CM | POA: Diagnosis not present

## 2017-07-04 DIAGNOSIS — G20 Parkinson's disease: Secondary | ICD-10-CM | POA: Diagnosis not present

## 2017-07-04 DIAGNOSIS — M1991 Primary osteoarthritis, unspecified site: Secondary | ICD-10-CM | POA: Diagnosis not present

## 2017-07-04 DIAGNOSIS — M5441 Lumbago with sciatica, right side: Secondary | ICD-10-CM | POA: Diagnosis not present

## 2017-07-04 DIAGNOSIS — D1809 Hemangioma of other sites: Secondary | ICD-10-CM | POA: Diagnosis not present

## 2017-07-06 DIAGNOSIS — M1991 Primary osteoarthritis, unspecified site: Secondary | ICD-10-CM | POA: Diagnosis not present

## 2017-07-06 DIAGNOSIS — G20 Parkinson's disease: Secondary | ICD-10-CM | POA: Diagnosis not present

## 2017-07-06 DIAGNOSIS — M5441 Lumbago with sciatica, right side: Secondary | ICD-10-CM | POA: Diagnosis not present

## 2017-07-06 DIAGNOSIS — D1809 Hemangioma of other sites: Secondary | ICD-10-CM | POA: Diagnosis not present

## 2017-07-06 DIAGNOSIS — I2510 Atherosclerotic heart disease of native coronary artery without angina pectoris: Secondary | ICD-10-CM | POA: Diagnosis not present

## 2017-07-06 DIAGNOSIS — I10 Essential (primary) hypertension: Secondary | ICD-10-CM | POA: Diagnosis not present

## 2017-07-08 DIAGNOSIS — M1991 Primary osteoarthritis, unspecified site: Secondary | ICD-10-CM | POA: Diagnosis not present

## 2017-07-08 DIAGNOSIS — D1809 Hemangioma of other sites: Secondary | ICD-10-CM | POA: Diagnosis not present

## 2017-07-08 DIAGNOSIS — G20 Parkinson's disease: Secondary | ICD-10-CM | POA: Diagnosis not present

## 2017-07-08 DIAGNOSIS — I2510 Atherosclerotic heart disease of native coronary artery without angina pectoris: Secondary | ICD-10-CM | POA: Diagnosis not present

## 2017-07-08 DIAGNOSIS — M5441 Lumbago with sciatica, right side: Secondary | ICD-10-CM | POA: Diagnosis not present

## 2017-07-08 DIAGNOSIS — I10 Essential (primary) hypertension: Secondary | ICD-10-CM | POA: Diagnosis not present

## 2017-07-11 DIAGNOSIS — I2510 Atherosclerotic heart disease of native coronary artery without angina pectoris: Secondary | ICD-10-CM | POA: Diagnosis not present

## 2017-07-11 DIAGNOSIS — G20 Parkinson's disease: Secondary | ICD-10-CM | POA: Diagnosis not present

## 2017-07-11 DIAGNOSIS — M1991 Primary osteoarthritis, unspecified site: Secondary | ICD-10-CM | POA: Diagnosis not present

## 2017-07-11 DIAGNOSIS — D1809 Hemangioma of other sites: Secondary | ICD-10-CM | POA: Diagnosis not present

## 2017-07-11 DIAGNOSIS — I10 Essential (primary) hypertension: Secondary | ICD-10-CM | POA: Diagnosis not present

## 2017-07-11 DIAGNOSIS — M5441 Lumbago with sciatica, right side: Secondary | ICD-10-CM | POA: Diagnosis not present

## 2017-07-12 DIAGNOSIS — G20 Parkinson's disease: Secondary | ICD-10-CM | POA: Diagnosis not present

## 2017-07-12 DIAGNOSIS — I951 Orthostatic hypotension: Secondary | ICD-10-CM | POA: Diagnosis not present

## 2017-07-12 DIAGNOSIS — E7800 Pure hypercholesterolemia, unspecified: Secondary | ICD-10-CM | POA: Diagnosis not present

## 2017-07-12 DIAGNOSIS — R600 Localized edema: Secondary | ICD-10-CM | POA: Diagnosis not present

## 2017-07-13 DIAGNOSIS — I10 Essential (primary) hypertension: Secondary | ICD-10-CM | POA: Diagnosis not present

## 2017-07-13 DIAGNOSIS — M5441 Lumbago with sciatica, right side: Secondary | ICD-10-CM | POA: Diagnosis not present

## 2017-07-13 DIAGNOSIS — G20 Parkinson's disease: Secondary | ICD-10-CM | POA: Diagnosis not present

## 2017-07-13 DIAGNOSIS — D1809 Hemangioma of other sites: Secondary | ICD-10-CM | POA: Diagnosis not present

## 2017-07-13 DIAGNOSIS — I2510 Atherosclerotic heart disease of native coronary artery without angina pectoris: Secondary | ICD-10-CM | POA: Diagnosis not present

## 2017-07-13 DIAGNOSIS — M1991 Primary osteoarthritis, unspecified site: Secondary | ICD-10-CM | POA: Diagnosis not present

## 2017-07-15 ENCOUNTER — Ambulatory Visit (INDEPENDENT_AMBULATORY_CARE_PROVIDER_SITE_OTHER): Payer: Medicare Other | Admitting: Cardiovascular Disease

## 2017-07-15 ENCOUNTER — Encounter: Payer: Self-pay | Admitting: Cardiovascular Disease

## 2017-07-15 VITALS — BP 101/58 | HR 55 | Wt 170.0 lb

## 2017-07-15 DIAGNOSIS — I2510 Atherosclerotic heart disease of native coronary artery without angina pectoris: Secondary | ICD-10-CM | POA: Diagnosis not present

## 2017-07-15 DIAGNOSIS — I10 Essential (primary) hypertension: Secondary | ICD-10-CM | POA: Diagnosis not present

## 2017-07-15 DIAGNOSIS — E7800 Pure hypercholesterolemia, unspecified: Secondary | ICD-10-CM | POA: Diagnosis not present

## 2017-07-15 DIAGNOSIS — M5441 Lumbago with sciatica, right side: Secondary | ICD-10-CM | POA: Diagnosis not present

## 2017-07-15 DIAGNOSIS — G20 Parkinson's disease: Secondary | ICD-10-CM | POA: Diagnosis not present

## 2017-07-15 DIAGNOSIS — D1809 Hemangioma of other sites: Secondary | ICD-10-CM | POA: Diagnosis not present

## 2017-07-15 DIAGNOSIS — M1991 Primary osteoarthritis, unspecified site: Secondary | ICD-10-CM | POA: Diagnosis not present

## 2017-07-15 NOTE — Patient Instructions (Signed)

## 2017-07-15 NOTE — Progress Notes (Signed)
07/15/2017 Paul Ortiz   1945-10-30  [id number]  Primary Physician Paul Frees, MD Primary Cardiologist: Paul Harp MD Paul Ortiz  HPI:  Paul Ortiz is a 72 y.o.  old thin appearing married Caucasian male father of 2, grandfather and 3 grandchildren's wife Paul Ortiz is accompanying him today and is also a patient of mine. He was referred by Paul Ortiz for cardiovascular evaluation and establishment of practice.  I last saw him in the office 07/07/2016.  He is anticipating getting a implantable neurostimulator at [facility] for Parkinson's disease and is cardiovascular clearance before the procedure and to stop his aspirin. He is retired from working until recovery. He has never smoked. He does have hypertension and hyperlipidemia as well as family history with a father who died of a myocardial approximately 72 to get a microinfarction in 2005 subsequent bypass grafting 3 by Paul Ortiz . He has had no real tissues with this since. He does have advanced Parkinson's disease and walks with a walker. He does get occasional palpitations and had a event monitor done back in February that showed occasional PVCs, PACs or short run of SVT. He was scheduled to have a deep brain stimulator placed for his Parkinson's disease but this was never done.  He was recently seen by his PCP and was noted to be significant Lee hypotensive.  There was a suggestion to drop it his beta-blocker dose however there was some hesitancy because of his prior history of palpitations and SVT.  He denies chest pain but does get some dyspnea on exertion probably related to deconditioning.  He also most likely is dehydrated as well.    Current Meds  Medication Sig  . ALPRAZolam (XANAX) 1 MG tablet Take 1 mg by mouth at bedtime.  Marland Kitchen aspirin 325 MG tablet Take 325 mg by mouth daily.  Marland Kitchen atorvastatin (LIPITOR) 20 MG tablet Take 20 mg by mouth daily.  . B Complex Vitamins  (VITAMIN B COMPLEX PO) Take 1 capsule by mouth daily.  . carbidopa-levodopa (SINEMET CR) 50-200 MG tablet Take 1 tablet by mouth at bedtime.  . carbidopa-levodopa (SINEMET IR) 25-250 MG tablet Take 1 tablet by mouth 3 (three) times daily.  . Cholecalciferol (VITAMIN D-1000 MAX ST) 1000 units tablet Take 1 tablet by mouth daily.  . furosemide (LASIX) 20 MG tablet Take 20 mg by mouth daily.  Marland Kitchen ipratropium (ATROVENT) 0.06 % nasal spray Place 2 sprays into the nose as needed.  . metoprolol succinate (TOPROL-XL) 25 MG 24 hr tablet Take 25 mg by mouth at bedtime.  . Omega-3 Fatty Acids (FISH OIL) 1000 MG CAPS Take 1,000 mg by mouth daily.  . ondansetron (ZOFRAN) 8 MG tablet Take 8 mg by mouth as needed.  Marland Kitchen oxybutynin (DITROPAN) 5 MG tablet Take 5 mg by mouth as needed.  . potassium chloride (MICRO-K) 10 MEQ CR capsule Take 1 capsule by mouth daily.  . Rotigotine 3 MG/24HR PT24 Place 1 patch onto the skin daily.  . tamsulosin (FLOMAX) 0.4 MG CAPS capsule Take 0.4 mg by mouth daily.     Allergies  Allergen Reactions  . Lactose Intolerance (Gi) Diarrhea  . Penicillins Rash    Social History   Socioeconomic History  . Marital status: Married    Spouse name: Not on file  . Number of children: Not on file  . Years of education: Not on file  . Highest education level: Not on file  Occupational History  . Not on file  Social Needs  . Financial resource strain: Not on file  . Food insecurity:    Worry: Not on file    Inability: Not on file  . Transportation needs:    Medical: Not on file    Non-medical: Not on file  Tobacco Use  . Smoking status: Never Smoker  . Smokeless tobacco: Never Used  Substance and Sexual Activity  . Alcohol use: No  . Drug use: No  . Sexual activity: Never  Lifestyle  . Physical activity:    Days per week: Not on file    Minutes per session: Not on file  . Stress: Not on file  Relationships  . Social connections:    Talks on phone: Not on file    Gets  together: Not on file    Attends religious service: Not on file    Active member of club or organization: Not on file    Attends meetings of clubs or organizations: Not on file    Relationship status: Not on file  . Intimate partner violence:    Fear of current or ex partner: Not on file    Emotionally abused: Not on file    Physically abused: Not on file    Forced sexual activity: Not on file  Other Topics Concern  . Not on file  Social History Narrative  . Not on file     Review of Systems: General: negative for chills, fever, night sweats or weight changes.  Cardiovascular: negative for chest pain, dyspnea on exertion, edema, orthopnea, palpitations, paroxysmal nocturnal dyspnea or shortness of breath Dermatological: negative for rash Respiratory: negative for cough or wheezing Urologic: negative for hematuria Abdominal: negative for nausea, vomiting, diarrhea, bright red blood per rectum, melena, or hematemesis Neurologic: negative for visual changes, syncope, or dizziness All other systems reviewed and are otherwise negative except as noted above.    Blood pressure (!) 101/58, pulse (!) 55, weight 170 lb (77.1 kg).  General appearance: alert and no distress Neck: no adenopathy, no carotid bruit, no JVD, supple, symmetrical, trachea midline and thyroid not enlarged, symmetric, no tenderness/mass/nodules Lungs: clear to auscultation bilaterally Heart: regular rate and rhythm, S1, S2 normal, no murmur, click, rub or gallop Extremities: extremities normal, atraumatic, no cyanosis or edema Pulses: 2+ and symmetric Skin: Skin color, texture, turgor normal. No rashes or lesions Neurologic: Alert and oriented X 3, normal strength and tone. Normal symmetric reflexes. Normal coordination and gait  EKG sinus rhythm at 61 with incomplete right bundle branch block and left axis deviation.  I personally reviewed this EKG  ASSESSMENT AND PLAN:   Coronary artery disease involving  native coronary artery of native heart without angina pectoris History of CAD status post coronary artery bypass grafting x3 by Dr. Darcey Ortiz in 2005.  He has had no coronary artery issues since that time.  He denies chest pain but does get mildly short of breath on exertion which I suspect is related to deconditioning given his advanced Parkinson's disease.  Essential hypertension History of essential hypertension her blood pressure measured today at 101/58.  He is on low-dose beta-blocker.  His blood pressure is fairly soft although I am hesitant to decrease his beta-blocker dose because of his prior history of SVT and palpitations.  Hyperlipidemia History of hyperlipidemia on statin therapy with recent lipid profile performed by his PCP 04/01/2017 revealing total cholesterol 106, LDL 56 and HDL 44.      Paul Harp  MD Lupe Carney, Raynald Kemp 07/15/2017 10:34 AM

## 2017-07-15 NOTE — Assessment & Plan Note (Signed)
History of hyperlipidemia on statin therapy with recent lipid profile performed by his PCP 04/01/2017 revealing total cholesterol 106, LDL 56 and HDL 44.

## 2017-07-15 NOTE — Assessment & Plan Note (Signed)
History of essential hypertension her blood pressure measured today at 101/58.  He is on low-dose beta-blocker.  His blood pressure is fairly soft although I am hesitant to decrease his beta-blocker dose because of his prior history of SVT and palpitations.

## 2017-07-15 NOTE — Assessment & Plan Note (Signed)
History of CAD status post coronary artery bypass grafting x3 by Dr. Darcey Nora in 2005.  He has had no coronary artery issues since that time.  He denies chest pain but does get mildly short of breath on exertion which I suspect is related to deconditioning given his advanced Parkinson's disease.

## 2017-07-18 DIAGNOSIS — I2510 Atherosclerotic heart disease of native coronary artery without angina pectoris: Secondary | ICD-10-CM | POA: Diagnosis not present

## 2017-07-18 DIAGNOSIS — I10 Essential (primary) hypertension: Secondary | ICD-10-CM | POA: Diagnosis not present

## 2017-07-18 DIAGNOSIS — M1991 Primary osteoarthritis, unspecified site: Secondary | ICD-10-CM | POA: Diagnosis not present

## 2017-07-18 DIAGNOSIS — M5441 Lumbago with sciatica, right side: Secondary | ICD-10-CM | POA: Diagnosis not present

## 2017-07-18 DIAGNOSIS — G20 Parkinson's disease: Secondary | ICD-10-CM | POA: Diagnosis not present

## 2017-07-18 DIAGNOSIS — D1809 Hemangioma of other sites: Secondary | ICD-10-CM | POA: Diagnosis not present

## 2017-07-20 DIAGNOSIS — G20 Parkinson's disease: Secondary | ICD-10-CM | POA: Diagnosis not present

## 2017-07-20 DIAGNOSIS — I10 Essential (primary) hypertension: Secondary | ICD-10-CM | POA: Diagnosis not present

## 2017-07-20 DIAGNOSIS — M1991 Primary osteoarthritis, unspecified site: Secondary | ICD-10-CM | POA: Diagnosis not present

## 2017-07-20 DIAGNOSIS — M5441 Lumbago with sciatica, right side: Secondary | ICD-10-CM | POA: Diagnosis not present

## 2017-07-20 DIAGNOSIS — I2510 Atherosclerotic heart disease of native coronary artery without angina pectoris: Secondary | ICD-10-CM | POA: Diagnosis not present

## 2017-07-20 DIAGNOSIS — D1809 Hemangioma of other sites: Secondary | ICD-10-CM | POA: Diagnosis not present

## 2017-07-22 ENCOUNTER — Encounter (HOSPITAL_COMMUNITY): Payer: Medicare Other

## 2017-07-22 ENCOUNTER — Encounter: Payer: Medicare Other | Admitting: Vascular Surgery

## 2017-07-22 DIAGNOSIS — I10 Essential (primary) hypertension: Secondary | ICD-10-CM | POA: Diagnosis not present

## 2017-07-22 DIAGNOSIS — M5441 Lumbago with sciatica, right side: Secondary | ICD-10-CM | POA: Diagnosis not present

## 2017-07-22 DIAGNOSIS — G20 Parkinson's disease: Secondary | ICD-10-CM | POA: Diagnosis not present

## 2017-07-22 DIAGNOSIS — D1809 Hemangioma of other sites: Secondary | ICD-10-CM | POA: Diagnosis not present

## 2017-07-22 DIAGNOSIS — M1991 Primary osteoarthritis, unspecified site: Secondary | ICD-10-CM | POA: Diagnosis not present

## 2017-07-22 DIAGNOSIS — I2510 Atherosclerotic heart disease of native coronary artery without angina pectoris: Secondary | ICD-10-CM | POA: Diagnosis not present

## 2017-07-25 DIAGNOSIS — M5441 Lumbago with sciatica, right side: Secondary | ICD-10-CM | POA: Diagnosis not present

## 2017-07-25 DIAGNOSIS — D1809 Hemangioma of other sites: Secondary | ICD-10-CM | POA: Diagnosis not present

## 2017-07-25 DIAGNOSIS — I10 Essential (primary) hypertension: Secondary | ICD-10-CM | POA: Diagnosis not present

## 2017-07-25 DIAGNOSIS — G20 Parkinson's disease: Secondary | ICD-10-CM | POA: Diagnosis not present

## 2017-07-25 DIAGNOSIS — M1991 Primary osteoarthritis, unspecified site: Secondary | ICD-10-CM | POA: Diagnosis not present

## 2017-07-25 DIAGNOSIS — I2510 Atherosclerotic heart disease of native coronary artery without angina pectoris: Secondary | ICD-10-CM | POA: Diagnosis not present

## 2017-07-26 DIAGNOSIS — I2510 Atherosclerotic heart disease of native coronary artery without angina pectoris: Secondary | ICD-10-CM | POA: Diagnosis not present

## 2017-07-26 DIAGNOSIS — G20 Parkinson's disease: Secondary | ICD-10-CM | POA: Diagnosis not present

## 2017-07-26 DIAGNOSIS — M1991 Primary osteoarthritis, unspecified site: Secondary | ICD-10-CM | POA: Diagnosis not present

## 2017-07-26 DIAGNOSIS — I10 Essential (primary) hypertension: Secondary | ICD-10-CM | POA: Diagnosis not present

## 2017-07-26 DIAGNOSIS — D1809 Hemangioma of other sites: Secondary | ICD-10-CM | POA: Diagnosis not present

## 2017-07-26 DIAGNOSIS — M5441 Lumbago with sciatica, right side: Secondary | ICD-10-CM | POA: Diagnosis not present

## 2017-07-27 DIAGNOSIS — M5441 Lumbago with sciatica, right side: Secondary | ICD-10-CM | POA: Diagnosis not present

## 2017-07-27 DIAGNOSIS — G20 Parkinson's disease: Secondary | ICD-10-CM | POA: Diagnosis not present

## 2017-07-27 DIAGNOSIS — M1991 Primary osteoarthritis, unspecified site: Secondary | ICD-10-CM | POA: Diagnosis not present

## 2017-07-27 DIAGNOSIS — I10 Essential (primary) hypertension: Secondary | ICD-10-CM | POA: Diagnosis not present

## 2017-07-27 DIAGNOSIS — I2510 Atherosclerotic heart disease of native coronary artery without angina pectoris: Secondary | ICD-10-CM | POA: Diagnosis not present

## 2017-07-27 DIAGNOSIS — D1809 Hemangioma of other sites: Secondary | ICD-10-CM | POA: Diagnosis not present

## 2017-07-28 DIAGNOSIS — M1991 Primary osteoarthritis, unspecified site: Secondary | ICD-10-CM | POA: Diagnosis not present

## 2017-07-28 DIAGNOSIS — I2510 Atherosclerotic heart disease of native coronary artery without angina pectoris: Secondary | ICD-10-CM | POA: Diagnosis not present

## 2017-07-28 DIAGNOSIS — D1809 Hemangioma of other sites: Secondary | ICD-10-CM | POA: Diagnosis not present

## 2017-07-28 DIAGNOSIS — M5441 Lumbago with sciatica, right side: Secondary | ICD-10-CM | POA: Diagnosis not present

## 2017-07-28 DIAGNOSIS — G20 Parkinson's disease: Secondary | ICD-10-CM | POA: Diagnosis not present

## 2017-07-28 DIAGNOSIS — I10 Essential (primary) hypertension: Secondary | ICD-10-CM | POA: Diagnosis not present

## 2017-07-29 DIAGNOSIS — I2510 Atherosclerotic heart disease of native coronary artery without angina pectoris: Secondary | ICD-10-CM | POA: Diagnosis not present

## 2017-07-29 DIAGNOSIS — I10 Essential (primary) hypertension: Secondary | ICD-10-CM | POA: Diagnosis not present

## 2017-07-29 DIAGNOSIS — M5441 Lumbago with sciatica, right side: Secondary | ICD-10-CM | POA: Diagnosis not present

## 2017-07-29 DIAGNOSIS — M1991 Primary osteoarthritis, unspecified site: Secondary | ICD-10-CM | POA: Diagnosis not present

## 2017-07-29 DIAGNOSIS — D1809 Hemangioma of other sites: Secondary | ICD-10-CM | POA: Diagnosis not present

## 2017-07-29 DIAGNOSIS — G20 Parkinson's disease: Secondary | ICD-10-CM | POA: Diagnosis not present

## 2017-08-01 DIAGNOSIS — I10 Essential (primary) hypertension: Secondary | ICD-10-CM | POA: Diagnosis not present

## 2017-08-01 DIAGNOSIS — M5441 Lumbago with sciatica, right side: Secondary | ICD-10-CM | POA: Diagnosis not present

## 2017-08-01 DIAGNOSIS — D1809 Hemangioma of other sites: Secondary | ICD-10-CM | POA: Diagnosis not present

## 2017-08-01 DIAGNOSIS — I2510 Atherosclerotic heart disease of native coronary artery without angina pectoris: Secondary | ICD-10-CM | POA: Diagnosis not present

## 2017-08-01 DIAGNOSIS — G20 Parkinson's disease: Secondary | ICD-10-CM | POA: Diagnosis not present

## 2017-08-01 DIAGNOSIS — M1991 Primary osteoarthritis, unspecified site: Secondary | ICD-10-CM | POA: Diagnosis not present

## 2017-08-02 DIAGNOSIS — G20 Parkinson's disease: Secondary | ICD-10-CM | POA: Diagnosis not present

## 2017-08-02 DIAGNOSIS — I10 Essential (primary) hypertension: Secondary | ICD-10-CM | POA: Diagnosis not present

## 2017-08-02 DIAGNOSIS — M1991 Primary osteoarthritis, unspecified site: Secondary | ICD-10-CM | POA: Diagnosis not present

## 2017-08-02 DIAGNOSIS — D1809 Hemangioma of other sites: Secondary | ICD-10-CM | POA: Diagnosis not present

## 2017-08-02 DIAGNOSIS — I2510 Atherosclerotic heart disease of native coronary artery without angina pectoris: Secondary | ICD-10-CM | POA: Diagnosis not present

## 2017-08-02 DIAGNOSIS — M5441 Lumbago with sciatica, right side: Secondary | ICD-10-CM | POA: Diagnosis not present

## 2017-08-03 DIAGNOSIS — I2510 Atherosclerotic heart disease of native coronary artery without angina pectoris: Secondary | ICD-10-CM | POA: Diagnosis not present

## 2017-08-03 DIAGNOSIS — M5441 Lumbago with sciatica, right side: Secondary | ICD-10-CM | POA: Diagnosis not present

## 2017-08-03 DIAGNOSIS — I10 Essential (primary) hypertension: Secondary | ICD-10-CM | POA: Diagnosis not present

## 2017-08-03 DIAGNOSIS — M1991 Primary osteoarthritis, unspecified site: Secondary | ICD-10-CM | POA: Diagnosis not present

## 2017-08-03 DIAGNOSIS — G20 Parkinson's disease: Secondary | ICD-10-CM | POA: Diagnosis not present

## 2017-08-03 DIAGNOSIS — D1809 Hemangioma of other sites: Secondary | ICD-10-CM | POA: Diagnosis not present

## 2017-08-05 DIAGNOSIS — I10 Essential (primary) hypertension: Secondary | ICD-10-CM | POA: Diagnosis not present

## 2017-08-05 DIAGNOSIS — M5441 Lumbago with sciatica, right side: Secondary | ICD-10-CM | POA: Diagnosis not present

## 2017-08-05 DIAGNOSIS — G20 Parkinson's disease: Secondary | ICD-10-CM | POA: Diagnosis not present

## 2017-08-05 DIAGNOSIS — M1991 Primary osteoarthritis, unspecified site: Secondary | ICD-10-CM | POA: Diagnosis not present

## 2017-08-05 DIAGNOSIS — I2510 Atherosclerotic heart disease of native coronary artery without angina pectoris: Secondary | ICD-10-CM | POA: Diagnosis not present

## 2017-08-05 DIAGNOSIS — D1809 Hemangioma of other sites: Secondary | ICD-10-CM | POA: Diagnosis not present

## 2017-08-09 DIAGNOSIS — G20 Parkinson's disease: Secondary | ICD-10-CM | POA: Diagnosis not present

## 2017-08-09 DIAGNOSIS — M5441 Lumbago with sciatica, right side: Secondary | ICD-10-CM | POA: Diagnosis not present

## 2017-08-09 DIAGNOSIS — I10 Essential (primary) hypertension: Secondary | ICD-10-CM | POA: Diagnosis not present

## 2017-08-09 DIAGNOSIS — M1991 Primary osteoarthritis, unspecified site: Secondary | ICD-10-CM | POA: Diagnosis not present

## 2017-08-09 DIAGNOSIS — D1809 Hemangioma of other sites: Secondary | ICD-10-CM | POA: Diagnosis not present

## 2017-08-09 DIAGNOSIS — I2510 Atherosclerotic heart disease of native coronary artery without angina pectoris: Secondary | ICD-10-CM | POA: Diagnosis not present

## 2017-08-12 DIAGNOSIS — M5441 Lumbago with sciatica, right side: Secondary | ICD-10-CM | POA: Diagnosis not present

## 2017-08-12 DIAGNOSIS — I2510 Atherosclerotic heart disease of native coronary artery without angina pectoris: Secondary | ICD-10-CM | POA: Diagnosis not present

## 2017-08-12 DIAGNOSIS — D1809 Hemangioma of other sites: Secondary | ICD-10-CM | POA: Diagnosis not present

## 2017-08-12 DIAGNOSIS — M1991 Primary osteoarthritis, unspecified site: Secondary | ICD-10-CM | POA: Diagnosis not present

## 2017-08-12 DIAGNOSIS — I10 Essential (primary) hypertension: Secondary | ICD-10-CM | POA: Diagnosis not present

## 2017-08-12 DIAGNOSIS — G20 Parkinson's disease: Secondary | ICD-10-CM | POA: Diagnosis not present

## 2017-08-15 DIAGNOSIS — M5441 Lumbago with sciatica, right side: Secondary | ICD-10-CM | POA: Diagnosis not present

## 2017-08-15 DIAGNOSIS — M1991 Primary osteoarthritis, unspecified site: Secondary | ICD-10-CM | POA: Diagnosis not present

## 2017-08-15 DIAGNOSIS — I10 Essential (primary) hypertension: Secondary | ICD-10-CM | POA: Diagnosis not present

## 2017-08-15 DIAGNOSIS — I2510 Atherosclerotic heart disease of native coronary artery without angina pectoris: Secondary | ICD-10-CM | POA: Diagnosis not present

## 2017-08-15 DIAGNOSIS — D1809 Hemangioma of other sites: Secondary | ICD-10-CM | POA: Diagnosis not present

## 2017-08-15 DIAGNOSIS — G20 Parkinson's disease: Secondary | ICD-10-CM | POA: Diagnosis not present

## 2017-08-18 DIAGNOSIS — G20 Parkinson's disease: Secondary | ICD-10-CM | POA: Diagnosis not present

## 2017-08-18 DIAGNOSIS — M5441 Lumbago with sciatica, right side: Secondary | ICD-10-CM | POA: Diagnosis not present

## 2017-08-18 DIAGNOSIS — I2510 Atherosclerotic heart disease of native coronary artery without angina pectoris: Secondary | ICD-10-CM | POA: Diagnosis not present

## 2017-08-18 DIAGNOSIS — I10 Essential (primary) hypertension: Secondary | ICD-10-CM | POA: Diagnosis not present

## 2017-08-18 DIAGNOSIS — M1991 Primary osteoarthritis, unspecified site: Secondary | ICD-10-CM | POA: Diagnosis not present

## 2017-08-18 DIAGNOSIS — R252 Cramp and spasm: Secondary | ICD-10-CM | POA: Diagnosis not present

## 2017-08-18 DIAGNOSIS — D1809 Hemangioma of other sites: Secondary | ICD-10-CM | POA: Diagnosis not present

## 2017-08-19 DIAGNOSIS — M1991 Primary osteoarthritis, unspecified site: Secondary | ICD-10-CM | POA: Diagnosis not present

## 2017-08-19 DIAGNOSIS — I10 Essential (primary) hypertension: Secondary | ICD-10-CM | POA: Diagnosis not present

## 2017-08-19 DIAGNOSIS — M5441 Lumbago with sciatica, right side: Secondary | ICD-10-CM | POA: Diagnosis not present

## 2017-08-19 DIAGNOSIS — I2510 Atherosclerotic heart disease of native coronary artery without angina pectoris: Secondary | ICD-10-CM | POA: Diagnosis not present

## 2017-08-19 DIAGNOSIS — D1809 Hemangioma of other sites: Secondary | ICD-10-CM | POA: Diagnosis not present

## 2017-08-19 DIAGNOSIS — G20 Parkinson's disease: Secondary | ICD-10-CM | POA: Diagnosis not present

## 2017-08-22 DIAGNOSIS — I2510 Atherosclerotic heart disease of native coronary artery without angina pectoris: Secondary | ICD-10-CM | POA: Diagnosis not present

## 2017-08-22 DIAGNOSIS — D1809 Hemangioma of other sites: Secondary | ICD-10-CM | POA: Diagnosis not present

## 2017-08-22 DIAGNOSIS — G20 Parkinson's disease: Secondary | ICD-10-CM | POA: Diagnosis not present

## 2017-08-22 DIAGNOSIS — M1991 Primary osteoarthritis, unspecified site: Secondary | ICD-10-CM | POA: Diagnosis not present

## 2017-08-22 DIAGNOSIS — I10 Essential (primary) hypertension: Secondary | ICD-10-CM | POA: Diagnosis not present

## 2017-08-22 DIAGNOSIS — M5441 Lumbago with sciatica, right side: Secondary | ICD-10-CM | POA: Diagnosis not present

## 2017-08-23 DIAGNOSIS — I2510 Atherosclerotic heart disease of native coronary artery without angina pectoris: Secondary | ICD-10-CM | POA: Diagnosis not present

## 2017-08-23 DIAGNOSIS — M1991 Primary osteoarthritis, unspecified site: Secondary | ICD-10-CM | POA: Diagnosis not present

## 2017-08-23 DIAGNOSIS — D1809 Hemangioma of other sites: Secondary | ICD-10-CM | POA: Diagnosis not present

## 2017-08-23 DIAGNOSIS — I10 Essential (primary) hypertension: Secondary | ICD-10-CM | POA: Diagnosis not present

## 2017-08-23 DIAGNOSIS — M5441 Lumbago with sciatica, right side: Secondary | ICD-10-CM | POA: Diagnosis not present

## 2017-08-23 DIAGNOSIS — G20 Parkinson's disease: Secondary | ICD-10-CM | POA: Diagnosis not present

## 2017-08-25 DIAGNOSIS — G20 Parkinson's disease: Secondary | ICD-10-CM | POA: Diagnosis not present

## 2017-08-25 DIAGNOSIS — M1991 Primary osteoarthritis, unspecified site: Secondary | ICD-10-CM | POA: Diagnosis not present

## 2017-08-25 DIAGNOSIS — D1809 Hemangioma of other sites: Secondary | ICD-10-CM | POA: Diagnosis not present

## 2017-08-25 DIAGNOSIS — I10 Essential (primary) hypertension: Secondary | ICD-10-CM | POA: Diagnosis not present

## 2017-08-25 DIAGNOSIS — I2510 Atherosclerotic heart disease of native coronary artery without angina pectoris: Secondary | ICD-10-CM | POA: Diagnosis not present

## 2017-08-25 DIAGNOSIS — M5441 Lumbago with sciatica, right side: Secondary | ICD-10-CM | POA: Diagnosis not present

## 2017-08-30 DIAGNOSIS — M1991 Primary osteoarthritis, unspecified site: Secondary | ICD-10-CM | POA: Diagnosis not present

## 2017-08-30 DIAGNOSIS — I10 Essential (primary) hypertension: Secondary | ICD-10-CM | POA: Diagnosis not present

## 2017-08-30 DIAGNOSIS — I2510 Atherosclerotic heart disease of native coronary artery without angina pectoris: Secondary | ICD-10-CM | POA: Diagnosis not present

## 2017-08-30 DIAGNOSIS — G20 Parkinson's disease: Secondary | ICD-10-CM | POA: Diagnosis not present

## 2017-08-30 DIAGNOSIS — D1809 Hemangioma of other sites: Secondary | ICD-10-CM | POA: Diagnosis not present

## 2017-08-30 DIAGNOSIS — M5441 Lumbago with sciatica, right side: Secondary | ICD-10-CM | POA: Diagnosis not present

## 2017-08-31 DIAGNOSIS — H532 Diplopia: Secondary | ICD-10-CM | POA: Diagnosis not present

## 2017-08-31 DIAGNOSIS — H524 Presbyopia: Secondary | ICD-10-CM | POA: Diagnosis not present

## 2017-08-31 DIAGNOSIS — H52222 Regular astigmatism, left eye: Secondary | ICD-10-CM | POA: Diagnosis not present

## 2017-08-31 DIAGNOSIS — H5211 Myopia, right eye: Secondary | ICD-10-CM | POA: Diagnosis not present

## 2017-08-31 DIAGNOSIS — H04123 Dry eye syndrome of bilateral lacrimal glands: Secondary | ICD-10-CM | POA: Diagnosis not present

## 2017-08-31 DIAGNOSIS — Z961 Presence of intraocular lens: Secondary | ICD-10-CM | POA: Diagnosis not present

## 2017-08-31 DIAGNOSIS — H52221 Regular astigmatism, right eye: Secondary | ICD-10-CM | POA: Diagnosis not present

## 2017-08-31 DIAGNOSIS — H26492 Other secondary cataract, left eye: Secondary | ICD-10-CM | POA: Diagnosis not present

## 2017-09-01 DIAGNOSIS — M1991 Primary osteoarthritis, unspecified site: Secondary | ICD-10-CM | POA: Diagnosis not present

## 2017-09-01 DIAGNOSIS — I2510 Atherosclerotic heart disease of native coronary artery without angina pectoris: Secondary | ICD-10-CM | POA: Diagnosis not present

## 2017-09-01 DIAGNOSIS — G20 Parkinson's disease: Secondary | ICD-10-CM | POA: Diagnosis not present

## 2017-09-01 DIAGNOSIS — I10 Essential (primary) hypertension: Secondary | ICD-10-CM | POA: Diagnosis not present

## 2017-09-01 DIAGNOSIS — M5441 Lumbago with sciatica, right side: Secondary | ICD-10-CM | POA: Diagnosis not present

## 2017-09-01 DIAGNOSIS — D1809 Hemangioma of other sites: Secondary | ICD-10-CM | POA: Diagnosis not present

## 2017-09-05 DIAGNOSIS — I2510 Atherosclerotic heart disease of native coronary artery without angina pectoris: Secondary | ICD-10-CM | POA: Diagnosis not present

## 2017-09-05 DIAGNOSIS — I10 Essential (primary) hypertension: Secondary | ICD-10-CM | POA: Diagnosis not present

## 2017-09-05 DIAGNOSIS — M1991 Primary osteoarthritis, unspecified site: Secondary | ICD-10-CM | POA: Diagnosis not present

## 2017-09-05 DIAGNOSIS — G20 Parkinson's disease: Secondary | ICD-10-CM | POA: Diagnosis not present

## 2017-09-05 DIAGNOSIS — M5441 Lumbago with sciatica, right side: Secondary | ICD-10-CM | POA: Diagnosis not present

## 2017-09-05 DIAGNOSIS — D1809 Hemangioma of other sites: Secondary | ICD-10-CM | POA: Diagnosis not present

## 2017-09-06 DIAGNOSIS — G20 Parkinson's disease: Secondary | ICD-10-CM | POA: Diagnosis not present

## 2017-09-06 DIAGNOSIS — M1991 Primary osteoarthritis, unspecified site: Secondary | ICD-10-CM | POA: Diagnosis not present

## 2017-09-06 DIAGNOSIS — D1809 Hemangioma of other sites: Secondary | ICD-10-CM | POA: Diagnosis not present

## 2017-09-06 DIAGNOSIS — I10 Essential (primary) hypertension: Secondary | ICD-10-CM | POA: Diagnosis not present

## 2017-09-06 DIAGNOSIS — M5441 Lumbago with sciatica, right side: Secondary | ICD-10-CM | POA: Diagnosis not present

## 2017-09-06 DIAGNOSIS — I2510 Atherosclerotic heart disease of native coronary artery without angina pectoris: Secondary | ICD-10-CM | POA: Diagnosis not present

## 2017-09-07 DIAGNOSIS — I959 Hypotension, unspecified: Secondary | ICD-10-CM | POA: Diagnosis not present

## 2017-09-07 DIAGNOSIS — R609 Edema, unspecified: Secondary | ICD-10-CM | POA: Diagnosis not present

## 2017-09-08 DIAGNOSIS — G20 Parkinson's disease: Secondary | ICD-10-CM | POA: Diagnosis not present

## 2017-09-08 DIAGNOSIS — I2510 Atherosclerotic heart disease of native coronary artery without angina pectoris: Secondary | ICD-10-CM | POA: Diagnosis not present

## 2017-09-08 DIAGNOSIS — M5441 Lumbago with sciatica, right side: Secondary | ICD-10-CM | POA: Diagnosis not present

## 2017-09-08 DIAGNOSIS — D1809 Hemangioma of other sites: Secondary | ICD-10-CM | POA: Diagnosis not present

## 2017-09-08 DIAGNOSIS — I10 Essential (primary) hypertension: Secondary | ICD-10-CM | POA: Diagnosis not present

## 2017-09-08 DIAGNOSIS — M1991 Primary osteoarthritis, unspecified site: Secondary | ICD-10-CM | POA: Diagnosis not present

## 2017-09-12 DIAGNOSIS — I10 Essential (primary) hypertension: Secondary | ICD-10-CM | POA: Diagnosis not present

## 2017-09-12 DIAGNOSIS — D1809 Hemangioma of other sites: Secondary | ICD-10-CM | POA: Diagnosis not present

## 2017-09-12 DIAGNOSIS — M1991 Primary osteoarthritis, unspecified site: Secondary | ICD-10-CM | POA: Diagnosis not present

## 2017-09-12 DIAGNOSIS — G20 Parkinson's disease: Secondary | ICD-10-CM | POA: Diagnosis not present

## 2017-09-12 DIAGNOSIS — M5441 Lumbago with sciatica, right side: Secondary | ICD-10-CM | POA: Diagnosis not present

## 2017-09-12 DIAGNOSIS — I2510 Atherosclerotic heart disease of native coronary artery without angina pectoris: Secondary | ICD-10-CM | POA: Diagnosis not present

## 2017-09-13 DIAGNOSIS — M5441 Lumbago with sciatica, right side: Secondary | ICD-10-CM | POA: Diagnosis not present

## 2017-09-13 DIAGNOSIS — D1809 Hemangioma of other sites: Secondary | ICD-10-CM | POA: Diagnosis not present

## 2017-09-13 DIAGNOSIS — I2510 Atherosclerotic heart disease of native coronary artery without angina pectoris: Secondary | ICD-10-CM | POA: Diagnosis not present

## 2017-09-13 DIAGNOSIS — M1991 Primary osteoarthritis, unspecified site: Secondary | ICD-10-CM | POA: Diagnosis not present

## 2017-09-13 DIAGNOSIS — G20 Parkinson's disease: Secondary | ICD-10-CM | POA: Diagnosis not present

## 2017-09-13 DIAGNOSIS — I10 Essential (primary) hypertension: Secondary | ICD-10-CM | POA: Diagnosis not present

## 2017-09-15 DIAGNOSIS — M5441 Lumbago with sciatica, right side: Secondary | ICD-10-CM | POA: Diagnosis not present

## 2017-09-15 DIAGNOSIS — M1991 Primary osteoarthritis, unspecified site: Secondary | ICD-10-CM | POA: Diagnosis not present

## 2017-09-15 DIAGNOSIS — I2510 Atherosclerotic heart disease of native coronary artery without angina pectoris: Secondary | ICD-10-CM | POA: Diagnosis not present

## 2017-09-15 DIAGNOSIS — I10 Essential (primary) hypertension: Secondary | ICD-10-CM | POA: Diagnosis not present

## 2017-09-15 DIAGNOSIS — D1809 Hemangioma of other sites: Secondary | ICD-10-CM | POA: Diagnosis not present

## 2017-09-15 DIAGNOSIS — G20 Parkinson's disease: Secondary | ICD-10-CM | POA: Diagnosis not present

## 2017-09-19 DIAGNOSIS — M5441 Lumbago with sciatica, right side: Secondary | ICD-10-CM | POA: Diagnosis not present

## 2017-09-19 DIAGNOSIS — G20 Parkinson's disease: Secondary | ICD-10-CM | POA: Diagnosis not present

## 2017-09-19 DIAGNOSIS — I2510 Atherosclerotic heart disease of native coronary artery without angina pectoris: Secondary | ICD-10-CM | POA: Diagnosis not present

## 2017-09-19 DIAGNOSIS — M1991 Primary osteoarthritis, unspecified site: Secondary | ICD-10-CM | POA: Diagnosis not present

## 2017-09-19 DIAGNOSIS — I10 Essential (primary) hypertension: Secondary | ICD-10-CM | POA: Diagnosis not present

## 2017-09-19 DIAGNOSIS — D1809 Hemangioma of other sites: Secondary | ICD-10-CM | POA: Diagnosis not present

## 2017-09-20 DIAGNOSIS — I10 Essential (primary) hypertension: Secondary | ICD-10-CM | POA: Diagnosis not present

## 2017-09-20 DIAGNOSIS — G20 Parkinson's disease: Secondary | ICD-10-CM | POA: Diagnosis not present

## 2017-09-20 DIAGNOSIS — M5441 Lumbago with sciatica, right side: Secondary | ICD-10-CM | POA: Diagnosis not present

## 2017-09-20 DIAGNOSIS — I2510 Atherosclerotic heart disease of native coronary artery without angina pectoris: Secondary | ICD-10-CM | POA: Diagnosis not present

## 2017-09-20 DIAGNOSIS — M1991 Primary osteoarthritis, unspecified site: Secondary | ICD-10-CM | POA: Diagnosis not present

## 2017-09-20 DIAGNOSIS — D1809 Hemangioma of other sites: Secondary | ICD-10-CM | POA: Diagnosis not present

## 2017-09-22 DIAGNOSIS — M5441 Lumbago with sciatica, right side: Secondary | ICD-10-CM | POA: Diagnosis not present

## 2017-09-22 DIAGNOSIS — M1991 Primary osteoarthritis, unspecified site: Secondary | ICD-10-CM | POA: Diagnosis not present

## 2017-09-22 DIAGNOSIS — I10 Essential (primary) hypertension: Secondary | ICD-10-CM | POA: Diagnosis not present

## 2017-09-22 DIAGNOSIS — G20 Parkinson's disease: Secondary | ICD-10-CM | POA: Diagnosis not present

## 2017-09-22 DIAGNOSIS — D1809 Hemangioma of other sites: Secondary | ICD-10-CM | POA: Diagnosis not present

## 2017-09-22 DIAGNOSIS — I2510 Atherosclerotic heart disease of native coronary artery without angina pectoris: Secondary | ICD-10-CM | POA: Diagnosis not present

## 2017-09-26 DIAGNOSIS — G4733 Obstructive sleep apnea (adult) (pediatric): Secondary | ICD-10-CM | POA: Diagnosis not present

## 2017-09-27 DIAGNOSIS — M5441 Lumbago with sciatica, right side: Secondary | ICD-10-CM | POA: Diagnosis not present

## 2017-09-27 DIAGNOSIS — M1991 Primary osteoarthritis, unspecified site: Secondary | ICD-10-CM | POA: Diagnosis not present

## 2017-09-27 DIAGNOSIS — I2510 Atherosclerotic heart disease of native coronary artery without angina pectoris: Secondary | ICD-10-CM | POA: Diagnosis not present

## 2017-09-27 DIAGNOSIS — D1809 Hemangioma of other sites: Secondary | ICD-10-CM | POA: Diagnosis not present

## 2017-09-27 DIAGNOSIS — G20 Parkinson's disease: Secondary | ICD-10-CM | POA: Diagnosis not present

## 2017-09-27 DIAGNOSIS — I10 Essential (primary) hypertension: Secondary | ICD-10-CM | POA: Diagnosis not present

## 2017-09-28 DIAGNOSIS — M5441 Lumbago with sciatica, right side: Secondary | ICD-10-CM | POA: Diagnosis not present

## 2017-09-28 DIAGNOSIS — M545 Low back pain: Secondary | ICD-10-CM | POA: Diagnosis not present

## 2017-09-28 DIAGNOSIS — G20 Parkinson's disease: Secondary | ICD-10-CM | POA: Diagnosis not present

## 2017-09-28 DIAGNOSIS — R109 Unspecified abdominal pain: Secondary | ICD-10-CM | POA: Diagnosis not present

## 2017-09-28 DIAGNOSIS — D1809 Hemangioma of other sites: Secondary | ICD-10-CM | POA: Diagnosis not present

## 2017-09-28 DIAGNOSIS — I2510 Atherosclerotic heart disease of native coronary artery without angina pectoris: Secondary | ICD-10-CM | POA: Diagnosis not present

## 2017-09-28 DIAGNOSIS — M1991 Primary osteoarthritis, unspecified site: Secondary | ICD-10-CM | POA: Diagnosis not present

## 2017-09-28 DIAGNOSIS — I10 Essential (primary) hypertension: Secondary | ICD-10-CM | POA: Diagnosis not present

## 2017-09-30 DIAGNOSIS — G20 Parkinson's disease: Secondary | ICD-10-CM | POA: Diagnosis not present

## 2017-09-30 DIAGNOSIS — D1809 Hemangioma of other sites: Secondary | ICD-10-CM | POA: Diagnosis not present

## 2017-09-30 DIAGNOSIS — I10 Essential (primary) hypertension: Secondary | ICD-10-CM | POA: Diagnosis not present

## 2017-09-30 DIAGNOSIS — M1991 Primary osteoarthritis, unspecified site: Secondary | ICD-10-CM | POA: Diagnosis not present

## 2017-09-30 DIAGNOSIS — M5441 Lumbago with sciatica, right side: Secondary | ICD-10-CM | POA: Diagnosis not present

## 2017-09-30 DIAGNOSIS — I2510 Atherosclerotic heart disease of native coronary artery without angina pectoris: Secondary | ICD-10-CM | POA: Diagnosis not present

## 2017-10-05 DIAGNOSIS — M1991 Primary osteoarthritis, unspecified site: Secondary | ICD-10-CM | POA: Diagnosis not present

## 2017-10-05 DIAGNOSIS — D1809 Hemangioma of other sites: Secondary | ICD-10-CM | POA: Diagnosis not present

## 2017-10-05 DIAGNOSIS — I10 Essential (primary) hypertension: Secondary | ICD-10-CM | POA: Diagnosis not present

## 2017-10-05 DIAGNOSIS — G20 Parkinson's disease: Secondary | ICD-10-CM | POA: Diagnosis not present

## 2017-10-05 DIAGNOSIS — M5441 Lumbago with sciatica, right side: Secondary | ICD-10-CM | POA: Diagnosis not present

## 2017-10-05 DIAGNOSIS — I2510 Atherosclerotic heart disease of native coronary artery without angina pectoris: Secondary | ICD-10-CM | POA: Diagnosis not present

## 2017-10-07 DIAGNOSIS — D1809 Hemangioma of other sites: Secondary | ICD-10-CM | POA: Diagnosis not present

## 2017-10-07 DIAGNOSIS — M5441 Lumbago with sciatica, right side: Secondary | ICD-10-CM | POA: Diagnosis not present

## 2017-10-07 DIAGNOSIS — M1991 Primary osteoarthritis, unspecified site: Secondary | ICD-10-CM | POA: Diagnosis not present

## 2017-10-07 DIAGNOSIS — I10 Essential (primary) hypertension: Secondary | ICD-10-CM | POA: Diagnosis not present

## 2017-10-07 DIAGNOSIS — G20 Parkinson's disease: Secondary | ICD-10-CM | POA: Diagnosis not present

## 2017-10-07 DIAGNOSIS — I2510 Atherosclerotic heart disease of native coronary artery without angina pectoris: Secondary | ICD-10-CM | POA: Diagnosis not present

## 2017-10-13 DIAGNOSIS — I10 Essential (primary) hypertension: Secondary | ICD-10-CM | POA: Diagnosis not present

## 2017-10-13 DIAGNOSIS — Z23 Encounter for immunization: Secondary | ICD-10-CM | POA: Diagnosis not present

## 2017-10-13 DIAGNOSIS — R1012 Left upper quadrant pain: Secondary | ICD-10-CM | POA: Diagnosis not present

## 2017-10-13 DIAGNOSIS — G20 Parkinson's disease: Secondary | ICD-10-CM | POA: Diagnosis not present

## 2017-10-13 DIAGNOSIS — R600 Localized edema: Secondary | ICD-10-CM | POA: Diagnosis not present

## 2017-10-13 DIAGNOSIS — K921 Melena: Secondary | ICD-10-CM | POA: Diagnosis not present

## 2017-10-13 DIAGNOSIS — F419 Anxiety disorder, unspecified: Secondary | ICD-10-CM | POA: Diagnosis not present

## 2017-10-13 DIAGNOSIS — I2510 Atherosclerotic heart disease of native coronary artery without angina pectoris: Secondary | ICD-10-CM | POA: Diagnosis not present

## 2017-10-13 DIAGNOSIS — E782 Mixed hyperlipidemia: Secondary | ICD-10-CM | POA: Diagnosis not present

## 2017-10-17 ENCOUNTER — Other Ambulatory Visit: Payer: Self-pay | Admitting: Family Medicine

## 2017-10-17 DIAGNOSIS — R1012 Left upper quadrant pain: Secondary | ICD-10-CM

## 2017-10-26 ENCOUNTER — Ambulatory Visit
Admission: RE | Admit: 2017-10-26 | Discharge: 2017-10-26 | Disposition: A | Discharge status: Home / Self Care | Payer: Medicare Other | Source: Ambulatory Visit | Attending: Family Medicine | Admitting: Family Medicine

## 2017-10-26 DIAGNOSIS — N200 Calculus of kidney: Secondary | ICD-10-CM | POA: Diagnosis not present

## 2017-10-26 DIAGNOSIS — R1012 Left upper quadrant pain: Secondary | ICD-10-CM

## 2017-11-04 DIAGNOSIS — R109 Unspecified abdominal pain: Secondary | ICD-10-CM | POA: Diagnosis not present

## 2017-11-04 DIAGNOSIS — Z791 Long term (current) use of non-steroidal anti-inflammatories (NSAID): Secondary | ICD-10-CM | POA: Diagnosis not present

## 2017-11-04 DIAGNOSIS — G20 Parkinson's disease: Secondary | ICD-10-CM | POA: Diagnosis not present

## 2017-11-16 DIAGNOSIS — I252 Old myocardial infarction: Secondary | ICD-10-CM | POA: Diagnosis not present

## 2017-11-16 DIAGNOSIS — N400 Enlarged prostate without lower urinary tract symptoms: Secondary | ICD-10-CM | POA: Diagnosis not present

## 2017-11-16 DIAGNOSIS — E7800 Pure hypercholesterolemia, unspecified: Secondary | ICD-10-CM | POA: Diagnosis not present

## 2017-11-16 DIAGNOSIS — G4733 Obstructive sleep apnea (adult) (pediatric): Secondary | ICD-10-CM | POA: Diagnosis not present

## 2017-11-16 DIAGNOSIS — E782 Mixed hyperlipidemia: Secondary | ICD-10-CM | POA: Diagnosis not present

## 2017-11-16 DIAGNOSIS — I2510 Atherosclerotic heart disease of native coronary artery without angina pectoris: Secondary | ICD-10-CM | POA: Diagnosis not present

## 2017-11-16 DIAGNOSIS — F419 Anxiety disorder, unspecified: Secondary | ICD-10-CM | POA: Diagnosis not present

## 2017-11-16 DIAGNOSIS — G20 Parkinson's disease: Secondary | ICD-10-CM | POA: Diagnosis not present

## 2017-11-16 DIAGNOSIS — E559 Vitamin D deficiency, unspecified: Secondary | ICD-10-CM | POA: Diagnosis not present

## 2017-11-16 DIAGNOSIS — F3341 Major depressive disorder, recurrent, in partial remission: Secondary | ICD-10-CM | POA: Diagnosis not present

## 2017-11-16 DIAGNOSIS — I10 Essential (primary) hypertension: Secondary | ICD-10-CM | POA: Diagnosis not present

## 2017-11-18 DIAGNOSIS — I10 Essential (primary) hypertension: Secondary | ICD-10-CM | POA: Diagnosis not present

## 2017-11-18 DIAGNOSIS — I2510 Atherosclerotic heart disease of native coronary artery without angina pectoris: Secondary | ICD-10-CM | POA: Diagnosis not present

## 2017-11-18 DIAGNOSIS — G4733 Obstructive sleep apnea (adult) (pediatric): Secondary | ICD-10-CM | POA: Diagnosis not present

## 2017-11-18 DIAGNOSIS — E559 Vitamin D deficiency, unspecified: Secondary | ICD-10-CM | POA: Diagnosis not present

## 2017-11-18 DIAGNOSIS — N400 Enlarged prostate without lower urinary tract symptoms: Secondary | ICD-10-CM | POA: Diagnosis not present

## 2017-11-18 DIAGNOSIS — G20 Parkinson's disease: Secondary | ICD-10-CM | POA: Diagnosis not present

## 2017-11-21 DIAGNOSIS — N400 Enlarged prostate without lower urinary tract symptoms: Secondary | ICD-10-CM | POA: Diagnosis not present

## 2017-11-21 DIAGNOSIS — E559 Vitamin D deficiency, unspecified: Secondary | ICD-10-CM | POA: Diagnosis not present

## 2017-11-21 DIAGNOSIS — I10 Essential (primary) hypertension: Secondary | ICD-10-CM | POA: Diagnosis not present

## 2017-11-21 DIAGNOSIS — G20 Parkinson's disease: Secondary | ICD-10-CM | POA: Diagnosis not present

## 2017-11-21 DIAGNOSIS — I2510 Atherosclerotic heart disease of native coronary artery without angina pectoris: Secondary | ICD-10-CM | POA: Diagnosis not present

## 2017-11-21 DIAGNOSIS — G4733 Obstructive sleep apnea (adult) (pediatric): Secondary | ICD-10-CM | POA: Diagnosis not present

## 2017-11-22 DIAGNOSIS — Z79899 Other long term (current) drug therapy: Secondary | ICD-10-CM | POA: Diagnosis not present

## 2017-11-22 DIAGNOSIS — M545 Low back pain: Secondary | ICD-10-CM | POA: Diagnosis not present

## 2017-11-22 DIAGNOSIS — F329 Major depressive disorder, single episode, unspecified: Secondary | ICD-10-CM | POA: Diagnosis not present

## 2017-11-22 DIAGNOSIS — G20 Parkinson's disease: Secondary | ICD-10-CM | POA: Diagnosis not present

## 2017-11-22 DIAGNOSIS — K117 Disturbances of salivary secretion: Secondary | ICD-10-CM | POA: Diagnosis not present

## 2017-11-23 DIAGNOSIS — G4733 Obstructive sleep apnea (adult) (pediatric): Secondary | ICD-10-CM | POA: Diagnosis not present

## 2017-11-23 DIAGNOSIS — G20 Parkinson's disease: Secondary | ICD-10-CM | POA: Diagnosis not present

## 2017-11-23 DIAGNOSIS — E559 Vitamin D deficiency, unspecified: Secondary | ICD-10-CM | POA: Diagnosis not present

## 2017-11-23 DIAGNOSIS — N400 Enlarged prostate without lower urinary tract symptoms: Secondary | ICD-10-CM | POA: Diagnosis not present

## 2017-11-23 DIAGNOSIS — I10 Essential (primary) hypertension: Secondary | ICD-10-CM | POA: Diagnosis not present

## 2017-11-23 DIAGNOSIS — I2510 Atherosclerotic heart disease of native coronary artery without angina pectoris: Secondary | ICD-10-CM | POA: Diagnosis not present

## 2017-11-25 DIAGNOSIS — E559 Vitamin D deficiency, unspecified: Secondary | ICD-10-CM | POA: Diagnosis not present

## 2017-11-25 DIAGNOSIS — N400 Enlarged prostate without lower urinary tract symptoms: Secondary | ICD-10-CM | POA: Diagnosis not present

## 2017-11-25 DIAGNOSIS — G4733 Obstructive sleep apnea (adult) (pediatric): Secondary | ICD-10-CM | POA: Diagnosis not present

## 2017-11-25 DIAGNOSIS — I10 Essential (primary) hypertension: Secondary | ICD-10-CM | POA: Diagnosis not present

## 2017-11-25 DIAGNOSIS — G20 Parkinson's disease: Secondary | ICD-10-CM | POA: Diagnosis not present

## 2017-11-25 DIAGNOSIS — I2510 Atherosclerotic heart disease of native coronary artery without angina pectoris: Secondary | ICD-10-CM | POA: Diagnosis not present

## 2017-11-28 DIAGNOSIS — N400 Enlarged prostate without lower urinary tract symptoms: Secondary | ICD-10-CM | POA: Diagnosis not present

## 2017-11-28 DIAGNOSIS — I10 Essential (primary) hypertension: Secondary | ICD-10-CM | POA: Diagnosis not present

## 2017-11-28 DIAGNOSIS — G20 Parkinson's disease: Secondary | ICD-10-CM | POA: Diagnosis not present

## 2017-11-28 DIAGNOSIS — E559 Vitamin D deficiency, unspecified: Secondary | ICD-10-CM | POA: Diagnosis not present

## 2017-11-28 DIAGNOSIS — I2510 Atherosclerotic heart disease of native coronary artery without angina pectoris: Secondary | ICD-10-CM | POA: Diagnosis not present

## 2017-11-28 DIAGNOSIS — G4733 Obstructive sleep apnea (adult) (pediatric): Secondary | ICD-10-CM | POA: Diagnosis not present

## 2017-11-30 DIAGNOSIS — N400 Enlarged prostate without lower urinary tract symptoms: Secondary | ICD-10-CM | POA: Diagnosis not present

## 2017-11-30 DIAGNOSIS — G20 Parkinson's disease: Secondary | ICD-10-CM | POA: Diagnosis not present

## 2017-11-30 DIAGNOSIS — I10 Essential (primary) hypertension: Secondary | ICD-10-CM | POA: Diagnosis not present

## 2017-11-30 DIAGNOSIS — G4733 Obstructive sleep apnea (adult) (pediatric): Secondary | ICD-10-CM | POA: Diagnosis not present

## 2017-11-30 DIAGNOSIS — E559 Vitamin D deficiency, unspecified: Secondary | ICD-10-CM | POA: Diagnosis not present

## 2017-11-30 DIAGNOSIS — I2510 Atherosclerotic heart disease of native coronary artery without angina pectoris: Secondary | ICD-10-CM | POA: Diagnosis not present

## 2017-12-02 DIAGNOSIS — N400 Enlarged prostate without lower urinary tract symptoms: Secondary | ICD-10-CM | POA: Diagnosis not present

## 2017-12-02 DIAGNOSIS — I2510 Atherosclerotic heart disease of native coronary artery without angina pectoris: Secondary | ICD-10-CM | POA: Diagnosis not present

## 2017-12-02 DIAGNOSIS — E559 Vitamin D deficiency, unspecified: Secondary | ICD-10-CM | POA: Diagnosis not present

## 2017-12-02 DIAGNOSIS — G20 Parkinson's disease: Secondary | ICD-10-CM | POA: Diagnosis not present

## 2017-12-02 DIAGNOSIS — G4733 Obstructive sleep apnea (adult) (pediatric): Secondary | ICD-10-CM | POA: Diagnosis not present

## 2017-12-02 DIAGNOSIS — I10 Essential (primary) hypertension: Secondary | ICD-10-CM | POA: Diagnosis not present

## 2017-12-05 DIAGNOSIS — N400 Enlarged prostate without lower urinary tract symptoms: Secondary | ICD-10-CM | POA: Diagnosis not present

## 2017-12-05 DIAGNOSIS — G20 Parkinson's disease: Secondary | ICD-10-CM | POA: Diagnosis not present

## 2017-12-05 DIAGNOSIS — I10 Essential (primary) hypertension: Secondary | ICD-10-CM | POA: Diagnosis not present

## 2017-12-05 DIAGNOSIS — E559 Vitamin D deficiency, unspecified: Secondary | ICD-10-CM | POA: Diagnosis not present

## 2017-12-05 DIAGNOSIS — I2510 Atherosclerotic heart disease of native coronary artery without angina pectoris: Secondary | ICD-10-CM | POA: Diagnosis not present

## 2017-12-05 DIAGNOSIS — G4733 Obstructive sleep apnea (adult) (pediatric): Secondary | ICD-10-CM | POA: Diagnosis not present

## 2017-12-07 DIAGNOSIS — G4733 Obstructive sleep apnea (adult) (pediatric): Secondary | ICD-10-CM | POA: Diagnosis not present

## 2017-12-07 DIAGNOSIS — I2510 Atherosclerotic heart disease of native coronary artery without angina pectoris: Secondary | ICD-10-CM | POA: Diagnosis not present

## 2017-12-07 DIAGNOSIS — N400 Enlarged prostate without lower urinary tract symptoms: Secondary | ICD-10-CM | POA: Diagnosis not present

## 2017-12-07 DIAGNOSIS — E559 Vitamin D deficiency, unspecified: Secondary | ICD-10-CM | POA: Diagnosis not present

## 2017-12-07 DIAGNOSIS — I10 Essential (primary) hypertension: Secondary | ICD-10-CM | POA: Diagnosis not present

## 2017-12-07 DIAGNOSIS — G20 Parkinson's disease: Secondary | ICD-10-CM | POA: Diagnosis not present

## 2017-12-09 DIAGNOSIS — E559 Vitamin D deficiency, unspecified: Secondary | ICD-10-CM | POA: Diagnosis not present

## 2017-12-09 DIAGNOSIS — G20 Parkinson's disease: Secondary | ICD-10-CM | POA: Diagnosis not present

## 2017-12-09 DIAGNOSIS — I10 Essential (primary) hypertension: Secondary | ICD-10-CM | POA: Diagnosis not present

## 2017-12-09 DIAGNOSIS — N400 Enlarged prostate without lower urinary tract symptoms: Secondary | ICD-10-CM | POA: Diagnosis not present

## 2017-12-09 DIAGNOSIS — G4733 Obstructive sleep apnea (adult) (pediatric): Secondary | ICD-10-CM | POA: Diagnosis not present

## 2017-12-09 DIAGNOSIS — I2510 Atherosclerotic heart disease of native coronary artery without angina pectoris: Secondary | ICD-10-CM | POA: Diagnosis not present

## 2017-12-14 DIAGNOSIS — E559 Vitamin D deficiency, unspecified: Secondary | ICD-10-CM | POA: Diagnosis not present

## 2017-12-14 DIAGNOSIS — I2510 Atherosclerotic heart disease of native coronary artery without angina pectoris: Secondary | ICD-10-CM | POA: Diagnosis not present

## 2017-12-14 DIAGNOSIS — G4733 Obstructive sleep apnea (adult) (pediatric): Secondary | ICD-10-CM | POA: Diagnosis not present

## 2017-12-14 DIAGNOSIS — I10 Essential (primary) hypertension: Secondary | ICD-10-CM | POA: Diagnosis not present

## 2017-12-14 DIAGNOSIS — N400 Enlarged prostate without lower urinary tract symptoms: Secondary | ICD-10-CM | POA: Diagnosis not present

## 2017-12-14 DIAGNOSIS — G20 Parkinson's disease: Secondary | ICD-10-CM | POA: Diagnosis not present

## 2017-12-15 DIAGNOSIS — G4733 Obstructive sleep apnea (adult) (pediatric): Secondary | ICD-10-CM | POA: Diagnosis not present

## 2017-12-15 DIAGNOSIS — N400 Enlarged prostate without lower urinary tract symptoms: Secondary | ICD-10-CM | POA: Diagnosis not present

## 2017-12-15 DIAGNOSIS — E559 Vitamin D deficiency, unspecified: Secondary | ICD-10-CM | POA: Diagnosis not present

## 2017-12-15 DIAGNOSIS — G20 Parkinson's disease: Secondary | ICD-10-CM | POA: Diagnosis not present

## 2017-12-15 DIAGNOSIS — I2510 Atherosclerotic heart disease of native coronary artery without angina pectoris: Secondary | ICD-10-CM | POA: Diagnosis not present

## 2017-12-15 DIAGNOSIS — I10 Essential (primary) hypertension: Secondary | ICD-10-CM | POA: Diagnosis not present

## 2017-12-16 DIAGNOSIS — G20 Parkinson's disease: Secondary | ICD-10-CM | POA: Diagnosis not present

## 2017-12-16 DIAGNOSIS — I10 Essential (primary) hypertension: Secondary | ICD-10-CM | POA: Diagnosis not present

## 2017-12-16 DIAGNOSIS — E559 Vitamin D deficiency, unspecified: Secondary | ICD-10-CM | POA: Diagnosis not present

## 2017-12-16 DIAGNOSIS — I2510 Atherosclerotic heart disease of native coronary artery without angina pectoris: Secondary | ICD-10-CM | POA: Diagnosis not present

## 2017-12-16 DIAGNOSIS — N400 Enlarged prostate without lower urinary tract symptoms: Secondary | ICD-10-CM | POA: Diagnosis not present

## 2017-12-16 DIAGNOSIS — G4733 Obstructive sleep apnea (adult) (pediatric): Secondary | ICD-10-CM | POA: Diagnosis not present

## 2017-12-19 DIAGNOSIS — I10 Essential (primary) hypertension: Secondary | ICD-10-CM | POA: Diagnosis not present

## 2017-12-19 DIAGNOSIS — E559 Vitamin D deficiency, unspecified: Secondary | ICD-10-CM | POA: Diagnosis not present

## 2017-12-19 DIAGNOSIS — N400 Enlarged prostate without lower urinary tract symptoms: Secondary | ICD-10-CM | POA: Diagnosis not present

## 2017-12-19 DIAGNOSIS — G20 Parkinson's disease: Secondary | ICD-10-CM | POA: Diagnosis not present

## 2017-12-19 DIAGNOSIS — G4733 Obstructive sleep apnea (adult) (pediatric): Secondary | ICD-10-CM | POA: Diagnosis not present

## 2017-12-19 DIAGNOSIS — I2510 Atherosclerotic heart disease of native coronary artery without angina pectoris: Secondary | ICD-10-CM | POA: Diagnosis not present

## 2017-12-21 DIAGNOSIS — N400 Enlarged prostate without lower urinary tract symptoms: Secondary | ICD-10-CM | POA: Diagnosis not present

## 2017-12-21 DIAGNOSIS — G4733 Obstructive sleep apnea (adult) (pediatric): Secondary | ICD-10-CM | POA: Diagnosis not present

## 2017-12-21 DIAGNOSIS — I10 Essential (primary) hypertension: Secondary | ICD-10-CM | POA: Diagnosis not present

## 2017-12-21 DIAGNOSIS — G20 Parkinson's disease: Secondary | ICD-10-CM | POA: Diagnosis not present

## 2017-12-21 DIAGNOSIS — I2510 Atherosclerotic heart disease of native coronary artery without angina pectoris: Secondary | ICD-10-CM | POA: Diagnosis not present

## 2017-12-21 DIAGNOSIS — E559 Vitamin D deficiency, unspecified: Secondary | ICD-10-CM | POA: Diagnosis not present

## 2017-12-23 DIAGNOSIS — N400 Enlarged prostate without lower urinary tract symptoms: Secondary | ICD-10-CM | POA: Diagnosis not present

## 2017-12-23 DIAGNOSIS — Z880 Allergy status to penicillin: Secondary | ICD-10-CM | POA: Diagnosis not present

## 2017-12-23 DIAGNOSIS — R1013 Epigastric pain: Secondary | ICD-10-CM | POA: Diagnosis not present

## 2017-12-23 DIAGNOSIS — G4733 Obstructive sleep apnea (adult) (pediatric): Secondary | ICD-10-CM | POA: Diagnosis not present

## 2017-12-23 DIAGNOSIS — I10 Essential (primary) hypertension: Secondary | ICD-10-CM | POA: Diagnosis not present

## 2017-12-23 DIAGNOSIS — R1031 Right lower quadrant pain: Secondary | ICD-10-CM | POA: Diagnosis not present

## 2017-12-23 DIAGNOSIS — G8929 Other chronic pain: Secondary | ICD-10-CM | POA: Diagnosis not present

## 2017-12-23 DIAGNOSIS — E559 Vitamin D deficiency, unspecified: Secondary | ICD-10-CM | POA: Diagnosis not present

## 2017-12-23 DIAGNOSIS — I2510 Atherosclerotic heart disease of native coronary artery without angina pectoris: Secondary | ICD-10-CM | POA: Diagnosis not present

## 2017-12-23 DIAGNOSIS — K5900 Constipation, unspecified: Secondary | ICD-10-CM | POA: Diagnosis not present

## 2017-12-23 DIAGNOSIS — G20 Parkinson's disease: Secondary | ICD-10-CM | POA: Diagnosis not present

## 2017-12-26 DIAGNOSIS — E559 Vitamin D deficiency, unspecified: Secondary | ICD-10-CM | POA: Diagnosis not present

## 2017-12-26 DIAGNOSIS — G4733 Obstructive sleep apnea (adult) (pediatric): Secondary | ICD-10-CM | POA: Diagnosis not present

## 2017-12-26 DIAGNOSIS — G20 Parkinson's disease: Secondary | ICD-10-CM | POA: Diagnosis not present

## 2017-12-26 DIAGNOSIS — I10 Essential (primary) hypertension: Secondary | ICD-10-CM | POA: Diagnosis not present

## 2017-12-26 DIAGNOSIS — I2510 Atherosclerotic heart disease of native coronary artery without angina pectoris: Secondary | ICD-10-CM | POA: Diagnosis not present

## 2017-12-26 DIAGNOSIS — N400 Enlarged prostate without lower urinary tract symptoms: Secondary | ICD-10-CM | POA: Diagnosis not present

## 2017-12-30 DIAGNOSIS — K7689 Other specified diseases of liver: Secondary | ICD-10-CM | POA: Diagnosis not present

## 2017-12-30 DIAGNOSIS — E559 Vitamin D deficiency, unspecified: Secondary | ICD-10-CM | POA: Diagnosis not present

## 2017-12-30 DIAGNOSIS — G20 Parkinson's disease: Secondary | ICD-10-CM | POA: Diagnosis not present

## 2017-12-30 DIAGNOSIS — I10 Essential (primary) hypertension: Secondary | ICD-10-CM | POA: Diagnosis not present

## 2017-12-30 DIAGNOSIS — N400 Enlarged prostate without lower urinary tract symptoms: Secondary | ICD-10-CM | POA: Diagnosis not present

## 2017-12-30 DIAGNOSIS — I2510 Atherosclerotic heart disease of native coronary artery without angina pectoris: Secondary | ICD-10-CM | POA: Diagnosis not present

## 2017-12-30 DIAGNOSIS — G4733 Obstructive sleep apnea (adult) (pediatric): Secondary | ICD-10-CM | POA: Diagnosis not present

## 2017-12-30 DIAGNOSIS — R1013 Epigastric pain: Secondary | ICD-10-CM | POA: Diagnosis not present

## 2018-01-04 DIAGNOSIS — I2510 Atherosclerotic heart disease of native coronary artery without angina pectoris: Secondary | ICD-10-CM | POA: Diagnosis not present

## 2018-01-04 DIAGNOSIS — E559 Vitamin D deficiency, unspecified: Secondary | ICD-10-CM | POA: Diagnosis not present

## 2018-01-04 DIAGNOSIS — G20 Parkinson's disease: Secondary | ICD-10-CM | POA: Diagnosis not present

## 2018-01-04 DIAGNOSIS — I10 Essential (primary) hypertension: Secondary | ICD-10-CM | POA: Diagnosis not present

## 2018-01-04 DIAGNOSIS — N400 Enlarged prostate without lower urinary tract symptoms: Secondary | ICD-10-CM | POA: Diagnosis not present

## 2018-01-04 DIAGNOSIS — G4733 Obstructive sleep apnea (adult) (pediatric): Secondary | ICD-10-CM | POA: Diagnosis not present

## 2018-01-06 DIAGNOSIS — E559 Vitamin D deficiency, unspecified: Secondary | ICD-10-CM | POA: Diagnosis not present

## 2018-01-06 DIAGNOSIS — I10 Essential (primary) hypertension: Secondary | ICD-10-CM | POA: Diagnosis not present

## 2018-01-06 DIAGNOSIS — N400 Enlarged prostate without lower urinary tract symptoms: Secondary | ICD-10-CM | POA: Diagnosis not present

## 2018-01-06 DIAGNOSIS — G4733 Obstructive sleep apnea (adult) (pediatric): Secondary | ICD-10-CM | POA: Diagnosis not present

## 2018-01-06 DIAGNOSIS — G20 Parkinson's disease: Secondary | ICD-10-CM | POA: Diagnosis not present

## 2018-01-06 DIAGNOSIS — I2510 Atherosclerotic heart disease of native coronary artery without angina pectoris: Secondary | ICD-10-CM | POA: Diagnosis not present

## 2018-01-09 DIAGNOSIS — N400 Enlarged prostate without lower urinary tract symptoms: Secondary | ICD-10-CM | POA: Diagnosis not present

## 2018-01-09 DIAGNOSIS — I10 Essential (primary) hypertension: Secondary | ICD-10-CM | POA: Diagnosis not present

## 2018-01-09 DIAGNOSIS — G4733 Obstructive sleep apnea (adult) (pediatric): Secondary | ICD-10-CM | POA: Diagnosis not present

## 2018-01-09 DIAGNOSIS — G20 Parkinson's disease: Secondary | ICD-10-CM | POA: Diagnosis not present

## 2018-01-09 DIAGNOSIS — E559 Vitamin D deficiency, unspecified: Secondary | ICD-10-CM | POA: Diagnosis not present

## 2018-01-09 DIAGNOSIS — I2510 Atherosclerotic heart disease of native coronary artery without angina pectoris: Secondary | ICD-10-CM | POA: Diagnosis not present

## 2018-01-13 DIAGNOSIS — I2510 Atherosclerotic heart disease of native coronary artery without angina pectoris: Secondary | ICD-10-CM | POA: Diagnosis not present

## 2018-01-13 DIAGNOSIS — I10 Essential (primary) hypertension: Secondary | ICD-10-CM | POA: Diagnosis not present

## 2018-01-13 DIAGNOSIS — G4733 Obstructive sleep apnea (adult) (pediatric): Secondary | ICD-10-CM | POA: Diagnosis not present

## 2018-01-13 DIAGNOSIS — E559 Vitamin D deficiency, unspecified: Secondary | ICD-10-CM | POA: Diagnosis not present

## 2018-01-13 DIAGNOSIS — G20 Parkinson's disease: Secondary | ICD-10-CM | POA: Diagnosis not present

## 2018-01-13 DIAGNOSIS — N400 Enlarged prostate without lower urinary tract symptoms: Secondary | ICD-10-CM | POA: Diagnosis not present

## 2018-01-15 DIAGNOSIS — E782 Mixed hyperlipidemia: Secondary | ICD-10-CM | POA: Diagnosis not present

## 2018-01-15 DIAGNOSIS — E559 Vitamin D deficiency, unspecified: Secondary | ICD-10-CM | POA: Diagnosis not present

## 2018-01-15 DIAGNOSIS — F3341 Major depressive disorder, recurrent, in partial remission: Secondary | ICD-10-CM | POA: Diagnosis not present

## 2018-01-15 DIAGNOSIS — I2510 Atherosclerotic heart disease of native coronary artery without angina pectoris: Secondary | ICD-10-CM | POA: Diagnosis not present

## 2018-01-15 DIAGNOSIS — G4733 Obstructive sleep apnea (adult) (pediatric): Secondary | ICD-10-CM | POA: Diagnosis not present

## 2018-01-15 DIAGNOSIS — G20 Parkinson's disease: Secondary | ICD-10-CM | POA: Diagnosis not present

## 2018-01-15 DIAGNOSIS — N400 Enlarged prostate without lower urinary tract symptoms: Secondary | ICD-10-CM | POA: Diagnosis not present

## 2018-01-15 DIAGNOSIS — I10 Essential (primary) hypertension: Secondary | ICD-10-CM | POA: Diagnosis not present

## 2018-01-15 DIAGNOSIS — I252 Old myocardial infarction: Secondary | ICD-10-CM | POA: Diagnosis not present

## 2018-01-19 DIAGNOSIS — E782 Mixed hyperlipidemia: Secondary | ICD-10-CM | POA: Diagnosis not present

## 2018-01-19 DIAGNOSIS — G20 Parkinson's disease: Secondary | ICD-10-CM | POA: Diagnosis not present

## 2018-01-19 DIAGNOSIS — F3341 Major depressive disorder, recurrent, in partial remission: Secondary | ICD-10-CM | POA: Diagnosis not present

## 2018-01-19 DIAGNOSIS — I252 Old myocardial infarction: Secondary | ICD-10-CM | POA: Diagnosis not present

## 2018-01-19 DIAGNOSIS — I2510 Atherosclerotic heart disease of native coronary artery without angina pectoris: Secondary | ICD-10-CM | POA: Diagnosis not present

## 2018-01-19 DIAGNOSIS — I10 Essential (primary) hypertension: Secondary | ICD-10-CM | POA: Diagnosis not present

## 2018-01-20 DIAGNOSIS — I2510 Atherosclerotic heart disease of native coronary artery without angina pectoris: Secondary | ICD-10-CM | POA: Diagnosis not present

## 2018-01-20 DIAGNOSIS — G20 Parkinson's disease: Secondary | ICD-10-CM | POA: Diagnosis not present

## 2018-01-20 DIAGNOSIS — I252 Old myocardial infarction: Secondary | ICD-10-CM | POA: Diagnosis not present

## 2018-01-20 DIAGNOSIS — I10 Essential (primary) hypertension: Secondary | ICD-10-CM | POA: Diagnosis not present

## 2018-01-20 DIAGNOSIS — E782 Mixed hyperlipidemia: Secondary | ICD-10-CM | POA: Diagnosis not present

## 2018-01-20 DIAGNOSIS — F3341 Major depressive disorder, recurrent, in partial remission: Secondary | ICD-10-CM | POA: Diagnosis not present

## 2018-01-21 DIAGNOSIS — I252 Old myocardial infarction: Secondary | ICD-10-CM | POA: Diagnosis not present

## 2018-01-21 DIAGNOSIS — I2510 Atherosclerotic heart disease of native coronary artery without angina pectoris: Secondary | ICD-10-CM | POA: Diagnosis not present

## 2018-01-21 DIAGNOSIS — F3341 Major depressive disorder, recurrent, in partial remission: Secondary | ICD-10-CM | POA: Diagnosis not present

## 2018-01-21 DIAGNOSIS — I10 Essential (primary) hypertension: Secondary | ICD-10-CM | POA: Diagnosis not present

## 2018-01-21 DIAGNOSIS — G20 Parkinson's disease: Secondary | ICD-10-CM | POA: Diagnosis not present

## 2018-01-21 DIAGNOSIS — E782 Mixed hyperlipidemia: Secondary | ICD-10-CM | POA: Diagnosis not present

## 2018-01-23 DIAGNOSIS — F3341 Major depressive disorder, recurrent, in partial remission: Secondary | ICD-10-CM | POA: Diagnosis not present

## 2018-01-23 DIAGNOSIS — I252 Old myocardial infarction: Secondary | ICD-10-CM | POA: Diagnosis not present

## 2018-01-23 DIAGNOSIS — G20 Parkinson's disease: Secondary | ICD-10-CM | POA: Diagnosis not present

## 2018-01-23 DIAGNOSIS — I10 Essential (primary) hypertension: Secondary | ICD-10-CM | POA: Diagnosis not present

## 2018-01-23 DIAGNOSIS — E782 Mixed hyperlipidemia: Secondary | ICD-10-CM | POA: Diagnosis not present

## 2018-01-23 DIAGNOSIS — I2510 Atherosclerotic heart disease of native coronary artery without angina pectoris: Secondary | ICD-10-CM | POA: Diagnosis not present

## 2018-01-26 DIAGNOSIS — F3341 Major depressive disorder, recurrent, in partial remission: Secondary | ICD-10-CM | POA: Diagnosis not present

## 2018-01-26 DIAGNOSIS — I252 Old myocardial infarction: Secondary | ICD-10-CM | POA: Diagnosis not present

## 2018-01-26 DIAGNOSIS — E782 Mixed hyperlipidemia: Secondary | ICD-10-CM | POA: Diagnosis not present

## 2018-01-26 DIAGNOSIS — I10 Essential (primary) hypertension: Secondary | ICD-10-CM | POA: Diagnosis not present

## 2018-01-26 DIAGNOSIS — G20 Parkinson's disease: Secondary | ICD-10-CM | POA: Diagnosis not present

## 2018-01-26 DIAGNOSIS — I2510 Atherosclerotic heart disease of native coronary artery without angina pectoris: Secondary | ICD-10-CM | POA: Diagnosis not present

## 2018-01-30 DIAGNOSIS — G20 Parkinson's disease: Secondary | ICD-10-CM | POA: Diagnosis not present

## 2018-01-30 DIAGNOSIS — I10 Essential (primary) hypertension: Secondary | ICD-10-CM | POA: Diagnosis not present

## 2018-01-30 DIAGNOSIS — E782 Mixed hyperlipidemia: Secondary | ICD-10-CM | POA: Diagnosis not present

## 2018-01-30 DIAGNOSIS — F419 Anxiety disorder, unspecified: Secondary | ICD-10-CM | POA: Diagnosis not present

## 2018-01-30 DIAGNOSIS — J300 Vasomotor rhinitis: Secondary | ICD-10-CM | POA: Diagnosis not present

## 2018-01-30 DIAGNOSIS — I2510 Atherosclerotic heart disease of native coronary artery without angina pectoris: Secondary | ICD-10-CM | POA: Diagnosis not present

## 2018-02-02 DIAGNOSIS — G20 Parkinson's disease: Secondary | ICD-10-CM | POA: Diagnosis not present

## 2018-02-02 DIAGNOSIS — I2510 Atherosclerotic heart disease of native coronary artery without angina pectoris: Secondary | ICD-10-CM | POA: Diagnosis not present

## 2018-02-02 DIAGNOSIS — E782 Mixed hyperlipidemia: Secondary | ICD-10-CM | POA: Diagnosis not present

## 2018-02-02 DIAGNOSIS — I10 Essential (primary) hypertension: Secondary | ICD-10-CM | POA: Diagnosis not present

## 2018-02-02 DIAGNOSIS — I252 Old myocardial infarction: Secondary | ICD-10-CM | POA: Diagnosis not present

## 2018-02-02 DIAGNOSIS — F3341 Major depressive disorder, recurrent, in partial remission: Secondary | ICD-10-CM | POA: Diagnosis not present

## 2018-02-03 DIAGNOSIS — I10 Essential (primary) hypertension: Secondary | ICD-10-CM | POA: Diagnosis not present

## 2018-02-03 DIAGNOSIS — E782 Mixed hyperlipidemia: Secondary | ICD-10-CM | POA: Diagnosis not present

## 2018-02-03 DIAGNOSIS — I252 Old myocardial infarction: Secondary | ICD-10-CM | POA: Diagnosis not present

## 2018-02-03 DIAGNOSIS — G20 Parkinson's disease: Secondary | ICD-10-CM | POA: Diagnosis not present

## 2018-02-03 DIAGNOSIS — I2510 Atherosclerotic heart disease of native coronary artery without angina pectoris: Secondary | ICD-10-CM | POA: Diagnosis not present

## 2018-02-03 DIAGNOSIS — F3341 Major depressive disorder, recurrent, in partial remission: Secondary | ICD-10-CM | POA: Diagnosis not present

## 2018-02-07 DIAGNOSIS — I252 Old myocardial infarction: Secondary | ICD-10-CM | POA: Diagnosis not present

## 2018-02-07 DIAGNOSIS — I10 Essential (primary) hypertension: Secondary | ICD-10-CM | POA: Diagnosis not present

## 2018-02-07 DIAGNOSIS — F3341 Major depressive disorder, recurrent, in partial remission: Secondary | ICD-10-CM | POA: Diagnosis not present

## 2018-02-07 DIAGNOSIS — E782 Mixed hyperlipidemia: Secondary | ICD-10-CM | POA: Diagnosis not present

## 2018-02-07 DIAGNOSIS — G20 Parkinson's disease: Secondary | ICD-10-CM | POA: Diagnosis not present

## 2018-02-07 DIAGNOSIS — I2510 Atherosclerotic heart disease of native coronary artery without angina pectoris: Secondary | ICD-10-CM | POA: Diagnosis not present

## 2018-02-10 ENCOUNTER — Encounter (HOSPITAL_COMMUNITY): Payer: Self-pay | Admitting: Emergency Medicine

## 2018-02-10 ENCOUNTER — Emergency Department (HOSPITAL_COMMUNITY)
Admission: EM | Admit: 2018-02-10 | Discharge: 2018-02-10 | Disposition: A | Discharge status: Home / Self Care | Payer: Medicare Other | Attending: Emergency Medicine | Admitting: Emergency Medicine

## 2018-02-10 ENCOUNTER — Emergency Department (HOSPITAL_COMMUNITY): Payer: Medicare Other

## 2018-02-10 DIAGNOSIS — Z951 Presence of aortocoronary bypass graft: Secondary | ICD-10-CM | POA: Diagnosis not present

## 2018-02-10 DIAGNOSIS — I10 Essential (primary) hypertension: Secondary | ICD-10-CM | POA: Insufficient documentation

## 2018-02-10 DIAGNOSIS — I252 Old myocardial infarction: Secondary | ICD-10-CM | POA: Insufficient documentation

## 2018-02-10 DIAGNOSIS — G20 Parkinson's disease: Secondary | ICD-10-CM | POA: Insufficient documentation

## 2018-02-10 DIAGNOSIS — R443 Hallucinations, unspecified: Secondary | ICD-10-CM | POA: Insufficient documentation

## 2018-02-10 DIAGNOSIS — I2510 Atherosclerotic heart disease of native coronary artery without angina pectoris: Secondary | ICD-10-CM | POA: Diagnosis not present

## 2018-02-10 DIAGNOSIS — I4510 Unspecified right bundle-branch block: Secondary | ICD-10-CM | POA: Diagnosis not present

## 2018-02-10 DIAGNOSIS — Z7982 Long term (current) use of aspirin: Secondary | ICD-10-CM | POA: Insufficient documentation

## 2018-02-10 DIAGNOSIS — R400 Somnolence: Secondary | ICD-10-CM | POA: Diagnosis not present

## 2018-02-10 DIAGNOSIS — G4700 Insomnia, unspecified: Secondary | ICD-10-CM | POA: Diagnosis not present

## 2018-02-10 DIAGNOSIS — R410 Disorientation, unspecified: Secondary | ICD-10-CM | POA: Diagnosis not present

## 2018-02-10 DIAGNOSIS — R4182 Altered mental status, unspecified: Secondary | ICD-10-CM | POA: Diagnosis present

## 2018-02-10 DIAGNOSIS — J449 Chronic obstructive pulmonary disease, unspecified: Secondary | ICD-10-CM | POA: Diagnosis not present

## 2018-02-10 DIAGNOSIS — Z79899 Other long term (current) drug therapy: Secondary | ICD-10-CM | POA: Diagnosis not present

## 2018-02-10 LAB — COMPREHENSIVE METABOLIC PANEL
ALT: 9 U/L (ref 0–44)
AST: 19 U/L (ref 15–41)
Albumin: 3.7 g/dL (ref 3.5–5.0)
Alkaline Phosphatase: 49 U/L (ref 38–126)
Anion gap: 4 — ABNORMAL LOW (ref 5–15)
BUN: 33 mg/dL — ABNORMAL HIGH (ref 8–23)
CO2: 30 mmol/L (ref 22–32)
Calcium: 9.7 mg/dL (ref 8.9–10.3)
Chloride: 111 mmol/L (ref 98–111)
Creatinine, Ser: 1.09 mg/dL (ref 0.61–1.24)
GFR calc Af Amer: >60 mL/min (ref >60)
Glucose, Bld: 83 mg/dL (ref 70–99)
Potassium: 3.9 mmol/L (ref 3.5–5.1)
Sodium: 145 mmol/L (ref 135–145)
TOTAL PROTEIN: 6.9 g/dL (ref 6.5–8.1)
Total Bilirubin: 0.8 mg/dL (ref 0.3–1.2)

## 2018-02-10 LAB — CBC WITH DIFFERENTIAL/PLATELET
Abs Immature Granulocytes: 0.01 10*3/uL (ref 0.00–0.07)
Basophils Absolute: 0 10*3/uL (ref 0.0–0.1)
Basophils Relative: 1 %
EOS PCT: 3 %
Eosinophils Absolute: 0.1 10*3/uL (ref 0.0–0.5)
HCT: 39.8 % (ref 39.0–52.0)
Hemoglobin: 12.9 g/dL — ABNORMAL LOW (ref 13.0–17.0)
Immature Granulocytes: 0 %
Lymphocytes Relative: 18 %
Lymphs Abs: 0.8 10*3/uL (ref 0.7–4.0)
MCH: 31.8 pg (ref 26.0–34.0)
MCHC: 32.4 g/dL (ref 30.0–36.0)
MCV: 98 fL (ref 80.0–100.0)
Monocytes Absolute: 0.3 10*3/uL (ref 0.1–1.0)
Monocytes Relative: 7 %
Neutro Abs: 3.1 10*3/uL (ref 1.7–7.7)
Neutrophils Relative %: 71 %
Platelets: 209 10*3/uL (ref 150–400)
RBC: 4.06 MIL/uL — ABNORMAL LOW (ref 4.22–5.81)
RDW: 13.2 % (ref 11.5–15.5)
WBC: 4.3 10*3/uL (ref 4.0–10.5)
nRBC: 0 % (ref 0.0–0.2)

## 2018-02-10 LAB — TROPONIN I: Troponin I: <0.03 ng/mL (ref <0.03)

## 2018-02-10 LAB — URINALYSIS, ROUTINE W REFLEX MICROSCOPIC
Bilirubin Urine: NEGATIVE
Glucose, UA: NEGATIVE mg/dL
HGB URINE DIPSTICK: NEGATIVE
Ketones, ur: 5 mg/dL — AB
Leukocytes, UA: NEGATIVE
Nitrite: NEGATIVE
Protein, ur: NEGATIVE mg/dL
SPECIFIC GRAVITY, URINE: 1.023 (ref 1.005–1.030)
pH: 5 (ref 5.0–8.0)

## 2018-02-10 LAB — CBG MONITORING, ED: GLUCOSE-CAPILLARY: 91 mg/dL (ref 70–99)

## 2018-02-10 MED ORDER — SODIUM CHLORIDE 0.9 % IV BOLUS
1000.0000 mL | Freq: Once | INTRAVENOUS | Status: AC
  Administered 2018-02-10: 1000 mL via INTRAVENOUS

## 2018-02-10 MED ORDER — QUETIAPINE FUMARATE 25 MG PO TABS: 12.5000 mg | ORAL_TABLET | Freq: Every day | ORAL | 0 refills | Status: DC

## 2018-02-10 NOTE — ED Triage Notes (Signed)
PT arrived from home via [facility]. Patient's wife reports that patient has been "confused more than usual, especially at night." Also reports dark brown urine, and burning with urination.

## 2018-02-10 NOTE — ED Provider Notes (Addendum)
[facility] EMERGENCY DEPARTMENT Provider Note   CSN: [id number] Arrival date & time: 02/10/18  1039     History   Chief Complaint No chief complaint on file.   HPI Paul Ortiz is a 73 y.o. male.  Pt presents to the ED today with confusion and hallucinations.  The pt has a hx of parkinson's disease; however, he has never had hallucinations.  He has not had any recent falls.  The pt's wife said he was very confused last night and this morning he was asking about the people in the house (there were no other people other than pt and wife).  He did not know where the bathroom was located.  He did not know his daughter had married and moved out years ago.  The pt's only complaint is that his legs hurt.  This is thought to be due to Parkinson's and has been going on for months.  Pain improves after taking Sinemet.  No f/c.  His wife also said he's not been sleeping hardly at all at night.     Past Medical History:  Diagnosis Date  . Abdominal wall hernia   . BPH with obstruction/lower urinary tract symptoms   . Central sleep apnea   . Coronary atherosclerosis of native coronary artery   . Hypercholesteremia   . Hypertension   . Insomnia   . Major depression   . MI (myocardial infarction) ([place]) 2005  . Mixed hyperlipidemia   . OSA (obstructive sleep apnea)   . Parkinson disease ([place])   . Vasomotor rhinitis     Patient Active Problem List   Diagnosis Date Noted  . Essential hypertension 07/07/2016  . Hyperlipidemia 07/07/2016  . Anxiety 07/06/2016  . OSA (obstructive sleep apnea) 07/06/2016  . Panic attacks 07/06/2016  . PD (Parkinson's disease) ([place]) 07/06/2016  . RLS (restless legs syndrome) 07/06/2016  . Chronic low back pain 06/17/2016  . Hemangioma of spine 06/17/2016  . 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  . Pain in both thighs 06/17/2016  . SVT (supraventricular tachycardia) ([place]) 04/12/2016  . Coronary artery disease involving native coronary  artery of native heart without angina pectoris 10/21/2014  . 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 CORONARY ARTERY BYPASS GRAFT  2005        Home Medications    Prior to Admission medications   Medication Sig Start Date End Date Taking? Authorizing Provider  ALPRAZolam Duanne Moron) 1 MG tablet Take 1 mg by mouth at bedtime.    [provider]  aspirin 325 MG tablet Take 325 mg by mouth daily.    [provider]  atorvastatin (LIPITOR) 20 MG tablet Take 20 mg by mouth daily.    [provider]  B Complex Vitamins (VITAMIN B COMPLEX PO) Take 1 capsule by mouth daily.    [provider]  carbidopa-levodopa (SINEMET CR) 50-200 MG tablet Take 1 tablet by mouth at bedtime.    [provider]  carbidopa-levodopa (SINEMET IR) 25-250 MG tablet Take 1 tablet by mouth 3 (three) times daily.    [provider]  Cholecalciferol (VITAMIN D-1000 MAX ST) 1000 units tablet Take 1 tablet by mouth daily.    [provider]  furosemide (LASIX) 20 MG tablet Take 20 mg by mouth daily.    [provider]  ipratropium (ATROVENT) 0.06 % nasal spray Place 2 sprays into the nose as needed. 07/22/14  [provider]  metoprolol succinate (TOPROL-XL) 25 MG 24 hr tablet Take 25 mg by mouth at bedtime. 02/16/16   [provider]  Omega-3 Fatty Acids (FISH OIL) 1000 MG CAPS Take 1,000 mg by mouth daily.    [provider]  ondansetron (ZOFRAN) 8 MG tablet Take 8 mg by mouth as needed.    [provider]  oxybutynin (DITROPAN) 5 MG tablet Take 5 mg by mouth as needed. 06/11/16   [provider]  potassium chloride (MICRO-K) 10 MEQ CR capsule Take 1 capsule by mouth daily.    [provider]  QUEtiapine (SEROQUEL) 25 MG tablet Take 0.5 tablets (12.5 mg total) by mouth at bedtime. 02/10/18   Isla Pence, MD  Rotigotine 3 MG/24HR PT24  Place 1 patch onto the skin daily.    [provider]  tamsulosin (FLOMAX) 0.4 MG CAPS capsule Take 0.4 mg by mouth daily. 03/03/16   [provider]    Family History No family history on file.  Social History Social History   Tobacco Use  . Smoking status: Never Smoker  . Smokeless tobacco: Never Used  Substance Use Topics  . Alcohol use: No  . Drug use: No     Allergies   Lactose intolerance (gi) and Penicillins   Review of Systems Review of Systems  Musculoskeletal:       Bilateral leg pain  All other systems reviewed and are negative.    Physical Exam Updated Vital Signs BP 139/78   Pulse 81   Temp (!) 97.4 F (36.3 C) (Oral)   Resp 14   Ht 6' (1.829 m)   Wt 79.4 kg   SpO2 99%   BMI 23.73 kg/m   Physical Exam Vitals signs and nursing note reviewed.  Constitutional:      Appearance: Normal appearance. He is normal weight.  HENT:     Head: Normocephalic and atraumatic.     Right Ear: External ear normal.     Left Ear: External ear normal.     Nose: Nose normal.     Mouth/Throat:     Mouth: Mucous membranes are moist.  Eyes:     Conjunctiva/sclera: Conjunctivae normal.  Neck:     Musculoskeletal: Normal range of motion and neck supple.  Cardiovascular:     Rate and Rhythm: Normal rate and regular rhythm.     Pulses: Normal pulses.     Heart sounds: Normal heart sounds.  Pulmonary:     Effort: Pulmonary effort is normal.     Breath sounds: Normal breath sounds.  Abdominal:     General: Abdomen is flat. Bowel sounds are normal.     Palpations: Abdomen is soft.  Musculoskeletal: Normal range of motion.  Skin:    General: Skin is warm and dry.     Capillary Refill: Capillary refill takes less than 2 seconds.  Neurological:     General: No focal deficit present.     Mental Status: He is alert and oriented to person, place, and time.  Psychiatric:        Mood and Affect: Mood normal.        Behavior: Behavior normal.       ED Treatments / Results  Labs (all labs ordered are listed, but only abnormal results are displayed) Labs Reviewed  CBC WITH DIFFERENTIAL/PLATELET - Abnormal; Notable for the following components:      Result Value   RBC 4.06 (*)    Hemoglobin 12.9 (*)  All other components within normal limits  COMPREHENSIVE METABOLIC PANEL - Abnormal; Notable for the following components:   BUN 33 (*)    Anion gap 4 (*)    All other components within normal limits  URINALYSIS, ROUTINE W REFLEX MICROSCOPIC - Abnormal; Notable for the following components:   Ketones, ur 5 (*)    All other components within normal limits  TROPONIN I  CBG MONITORING, ED    EKG EKG Interpretation  Date/Time:  Friday February 10 2018 12:26:35 EST Ventricular Rate:  71 PR Interval:    QRS Duration: 110 QT Interval:  426 QTC Calculation: 463 R Axis:   -75 Text Interpretation:  Sinus rhythm Incomplete RBBB and LAFB Probable RVH w/ secondary repol abnormality No significant change since last tracing Confirmed by Isla Pence [phone]) on 02/10/2018 1:22:22 PM   Radiology Dg Chest 2 View  Result Date: 02/10/2018 CLINICAL DATA:  Confusion, possible UTI EXAM: CHEST - 2 VIEW COMPARISON:  03/21/2017 FINDINGS: Cardiac shadows within normal limits. Thoracic aorta is tortuous with mild calcifications stable from the prior exam. No focal infiltrate or sizable effusion is seen. Mild hyperinflation is noted. No acute bony abnormality is seen. IMPRESSION: COPD without acute abnormality. Electronically Signed   By: Inez Catalina M.D.   On: 02/10/2018 12:04   Ct Head Wo Contrast  Result Date: 02/10/2018 CLINICAL DATA:  73 year old male with confusion and altered level of consciousness. EXAM: CT HEAD WITHOUT CONTRAST TECHNIQUE: Contiguous axial images were obtained from the base of the skull through the vertex without intravenous contrast. COMPARISON:  12/23/2008 and prior CTs FINDINGS: Brain: A LEFT thalamic infarct is  age indeterminate, but new since 2010. Mild atrophy again identified. Moderate chronic small-vessel white matter ischemic changes are identified. There is no evidence of mass lesion, midline shift, hemorrhage or extra-axial collection. Vascular: Vertebral and carotid atherosclerotic calcifications again noted. Skull: No acute abnormality Sinuses/Orbits: No acute abnormality Other: None IMPRESSION: 1. 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 No hemorrhage. 2. Atrophy and chronic small-vessel white matter ischemic changes Electronically Signed   By: Margarette Canada M.D.   On: 02/10/2018 11:59    Procedures Procedures (including critical care time)  Medications Ordered in ED Medications  sodium chloride 0.9 % bolus 1,000 mL (0 mLs Intravenous Stopped 02/10/18 1228)     Initial Impression / Assessment and Plan / ED Course  I have reviewed the triage vital signs and the nursing notes.  Pertinent labs & imaging results that were available during my care of the patient were reviewed by me and considered in my medical decision making (see chart for details).     Pt does have an infarct which is new since 2010.  No focal neurologic signs now.  No need for admission for stroke work up. Family aware of finding.  Pt has had insomnia which is probably contributing to the hallucinations.  The pt does not have evidence of infection.  He was d/w Dr. Linus Mako (pt's neurologist at [facility]) who recommends starting him on Seroquel 12.5 mg at night to help with both problems.    Pt and wife also question whether or not he still needs CPAP as it causes him anxiety.  I told him to speak with his pcp regarding the CPAP.  Pt will likely need another sleep study.  Pt to f/u with pcp and with neurology.  Final Clinical Impressions(s) / ED Diagnoses   Final diagnoses:  Parkinson's disease ([place])  Insomnia, unspecified type  Hallucinations  ED Discharge Orders         Ordered    QUEtiapine  (SEROQUEL) 25 MG tablet  Daily at bedtime     02/10/18 1316           Isla Pence, MD 02/10/18 1321    Isla Pence, MD 02/10/18 1322

## 2018-02-10 NOTE — ED Notes (Signed)
Patient transported to CT 

## 2018-02-10 NOTE — ED Notes (Signed)
Urine culture send with urine sample  

## 2018-02-10 NOTE — ED Notes (Addendum)
D/c reviewed with patient and family. Patient's spouse is encouraged to take respite. Daughter and son in law at bedside No further questions at this time

## 2018-02-11 DIAGNOSIS — I2510 Atherosclerotic heart disease of native coronary artery without angina pectoris: Secondary | ICD-10-CM | POA: Diagnosis not present

## 2018-02-11 DIAGNOSIS — G20 Parkinson's disease: Secondary | ICD-10-CM | POA: Diagnosis not present

## 2018-02-11 DIAGNOSIS — E782 Mixed hyperlipidemia: Secondary | ICD-10-CM | POA: Diagnosis not present

## 2018-02-11 DIAGNOSIS — F3341 Major depressive disorder, recurrent, in partial remission: Secondary | ICD-10-CM | POA: Diagnosis not present

## 2018-02-11 DIAGNOSIS — I252 Old myocardial infarction: Secondary | ICD-10-CM | POA: Diagnosis not present

## 2018-02-11 DIAGNOSIS — I10 Essential (primary) hypertension: Secondary | ICD-10-CM | POA: Diagnosis not present

## 2018-02-15 DIAGNOSIS — I252 Old myocardial infarction: Secondary | ICD-10-CM | POA: Diagnosis not present

## 2018-02-15 DIAGNOSIS — F3341 Major depressive disorder, recurrent, in partial remission: Secondary | ICD-10-CM | POA: Diagnosis not present

## 2018-02-15 DIAGNOSIS — I10 Essential (primary) hypertension: Secondary | ICD-10-CM | POA: Diagnosis not present

## 2018-02-15 DIAGNOSIS — G20 Parkinson's disease: Secondary | ICD-10-CM | POA: Diagnosis not present

## 2018-02-15 DIAGNOSIS — I2510 Atherosclerotic heart disease of native coronary artery without angina pectoris: Secondary | ICD-10-CM | POA: Diagnosis not present

## 2018-02-15 DIAGNOSIS — E782 Mixed hyperlipidemia: Secondary | ICD-10-CM | POA: Diagnosis not present

## 2018-02-16 ENCOUNTER — Emergency Department (HOSPITAL_COMMUNITY)
Admission: EM | Admit: 2018-02-16 | Discharge: 2018-02-16 | Disposition: A | Discharge status: Home / Self Care | Payer: Medicare Other | Attending: Emergency Medicine | Admitting: Emergency Medicine

## 2018-02-16 ENCOUNTER — Other Ambulatory Visit: Payer: Self-pay

## 2018-02-16 ENCOUNTER — Emergency Department (HOSPITAL_COMMUNITY): Payer: Medicare Other

## 2018-02-16 ENCOUNTER — Encounter (HOSPITAL_COMMUNITY): Payer: Self-pay | Admitting: *Deleted

## 2018-02-16 DIAGNOSIS — G479 Sleep disorder, unspecified: Secondary | ICD-10-CM | POA: Diagnosis not present

## 2018-02-16 DIAGNOSIS — Y929 Unspecified place or not applicable: Secondary | ICD-10-CM | POA: Diagnosis not present

## 2018-02-16 DIAGNOSIS — I10 Essential (primary) hypertension: Secondary | ICD-10-CM | POA: Diagnosis not present

## 2018-02-16 DIAGNOSIS — Z9183 Wandering in diseases classified elsewhere: Secondary | ICD-10-CM | POA: Diagnosis not present

## 2018-02-16 DIAGNOSIS — Z79899 Other long term (current) drug therapy: Secondary | ICD-10-CM | POA: Insufficient documentation

## 2018-02-16 DIAGNOSIS — Y999 Unspecified external cause status: Secondary | ICD-10-CM | POA: Insufficient documentation

## 2018-02-16 DIAGNOSIS — S0990XA Unspecified injury of head, initial encounter: Secondary | ICD-10-CM | POA: Insufficient documentation

## 2018-02-16 DIAGNOSIS — W19XXXA Unspecified fall, initial encounter: Secondary | ICD-10-CM | POA: Insufficient documentation

## 2018-02-16 DIAGNOSIS — G20 Parkinson's disease: Secondary | ICD-10-CM | POA: Diagnosis not present

## 2018-02-16 DIAGNOSIS — Y939 Activity, unspecified: Secondary | ICD-10-CM | POA: Diagnosis not present

## 2018-02-16 DIAGNOSIS — F0280 Dementia in other diseases classified elsewhere without behavioral disturbance: Secondary | ICD-10-CM | POA: Diagnosis not present

## 2018-02-16 NOTE — ED Provider Notes (Signed)
[facility]ER[facility] DEPARTMENT Provider Note   CSN: [id number] Arrival date & time: 02/16/18  3220     History   Chief Complaint Chief Complaint  Patient presents with  . Fall    HPI Paul Ortiz is a 73 y.o. male.  HPI Patient has Parkinson's disease.  He got up to go to the bathroom and lost his balance.  Patient fell on the hard floor and struck his head.  He denies loss of consciousness.  He does have a large contusion to the side of his head.  He reports mild headache.  He denies visual change.  He denies weakness numbness tingling to extremities.  He denies neck pain.  His wife reports that he hit the floor hard and she could not get him back up.  He otherwise is fatigued but she reports that he started a new medication for hallucinations and sundowning that is making him sleep a lot of the time. Past Medical History:  Diagnosis Date  . Abdominal wall hernia   . BPH with obstruction/lower urinary tract symptoms   . Central sleep apnea   . Coronary atherosclerosis of native coronary artery   . Hypercholesteremia   . Hypertension   . Insomnia   . Major depression   . MI (myocardial infarction) ([place]) 2005  . Mixed hyperlipidemia   . OSA (obstructive sleep apnea)   . Parkinson disease ([place])   . Vasomotor rhinitis     Patient Active Problem List   Diagnosis Date Noted  . Essential hypertension 07/07/2016  . Hyperlipidemia 07/07/2016  . Anxiety 07/06/2016  . OSA (obstructive sleep apnea) 07/06/2016  . Panic attacks 07/06/2016  . PD (Parkinson's disease) ([place]) 07/06/2016  . RLS (restless legs syndrome) 07/06/2016  . Chronic low back pain 06/17/2016  . Hemangioma of spine 06/17/2016  . 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  . Pain in both thighs 06/17/2016  . SVT (supraventricular tachycardia) ([place]) 04/12/2016  . Coronary artery disease involving native coronary artery of native heart without angina pectoris 10/21/2014  . 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 CARDIAC SURGERY    . CORONARY ARTERY BYPASS GRAFT  2005        Home Medications    Prior to Admission medications   Medication Sig Start Date End Date Taking? Authorizing Provider  ALPRAZolam Duanne Moron) 1 MG tablet Take 1 mg by mouth at bedtime.    [provider]  aspirin 325 MG tablet Take 325 mg by mouth daily.    [provider]  atorvastatin (LIPITOR) 20 MG tablet Take 20 mg by mouth daily.    [provider]  B Complex Vitamins (VITAMIN B COMPLEX PO) Take 1 capsule by mouth daily.    [provider]  carbidopa-levodopa (SINEMET CR) 50-200 MG tablet Take 1 tablet by mouth at bedtime.    [provider]  carbidopa-levodopa (SINEMET IR) 25-250 MG tablet Take 1 tablet by mouth 3 (three) times daily.    [provider]  Cholecalciferol (VITAMIN D-1000 MAX ST) 1000 units tablet Take 1 tablet by mouth daily.    [provider]  furosemide (LASIX) 20 MG tablet Take 20 mg by mouth daily.    [provider]  ipratropium (ATROVENT) 0.06 % nasal spray Place 2 sprays into the nose as needed. 07/22/14   [provider]  metoprolol succinate (TOPROL-XL) 25 MG 24 hr tablet Take  25 mg by mouth at bedtime. 02/16/16   [provider]  Omega-3 Fatty Acids (FISH OIL) 1000 MG CAPS Take 1,000 mg by mouth daily.    [provider]  ondansetron (ZOFRAN) 8 MG tablet Take 8 mg by mouth as needed.    [provider]  oxybutynin (DITROPAN) 5 MG tablet Take 5 mg by mouth as needed. 06/11/16   [provider]  potassium chloride (MICRO-K) 10 MEQ CR capsule Take 1 capsule by mouth daily.    [provider]  QUEtiapine (SEROQUEL) 25 MG tablet Take 0.5 tablets (12.5 mg total) by mouth at bedtime. 02/10/18   Isla Pence, MD  Rotigotine 3 MG/24HR PT24 Place 1 patch onto the skin daily.    [provider]   tamsulosin (FLOMAX) 0.4 MG CAPS capsule Take 0.4 mg by mouth daily. 03/03/16   [provider]    Family History No family history on file.  Social History Social History   Tobacco Use  . Smoking status: Never Smoker  . Smokeless tobacco: Never Used  Substance Use Topics  . Alcohol use: No  . Drug use: No     Allergies   Lactose intolerance (gi) and Penicillins   Review of Systems Review of Systems 10 Systems reviewed and are negative for acute change except as noted in the HPI.   Physical Exam Updated Vital Signs BP 130/70   Pulse 65   Resp 10   Ht 6' (1.829 m)   Wt 79.3 kg   SpO2 96%   BMI 23.71 kg/m   Physical Exam Constitutional:      Comments: Patient is resting calmly.  He awakens to voice.  No respiratory distress.  Nontoxic.  HENT:     Head:     Comments: Patient has a 5 cm hematoma to the right temple area no laceration.  Face otherwise normal without trauma.    Nose: Nose normal.  Eyes:     Extraocular Movements: Extraocular movements intact.     Pupils: Pupils are equal, round, and reactive to light.  Neck:     Comments: Cervical spine nontender to palpation Cardiovascular:     Rate and Rhythm: Normal rate and regular rhythm.  Pulmonary:     Effort: Pulmonary effort is normal.     Breath sounds: Normal breath sounds.  Chest:     Chest wall: No tenderness.  Abdominal:     General: There is no distension.     Palpations: Abdomen is soft.     Tenderness: There is no abdominal tenderness.  Musculoskeletal:     Comments: Contusion to right shoulder.  Normal range of motion no deformity.  Both upper extremities can be put through range of motion without pain.  Both lower extremities flex and extend without pain.  No contusions or abrasions to lower extremities.  Skin:    General: Skin is warm and dry.  Neurological:     Comments: Patient is slightly somnolent but awakens appropriately for interactions.  He follows commands to extent of  physical capacity.  No focal deficits.  Parkinsonism.      ED Treatments / Results  Labs (all labs ordered are listed, but only abnormal results are displayed) Labs Reviewed - No data to display  EKG None  Radiology Ct Head Wo Contrast  Result Date: 02/16/2018 CLINICAL DATA:  Minor head trauma, high clinical risk. EXAM: CT HEAD WITHOUT CONTRAST TECHNIQUE: Contiguous axial images were obtained from the base of the skull through the  vertex without intravenous contrast. COMPARISON:  02/10/2018 FINDINGS: Brain: No evidence of acute infarction, hemorrhage, hydrocephalus, extra-axial collection or mass lesion/mass effect. Moderate chronic small vessel ischemic gliosis in the cerebral white matter. Remote lacunar infarcts in the bilateral thalamus and left caudate. Vascular: Atherosclerotic calcification. Skull: Negative for fracture. Sinuses/Orbits: No evidence of injury.  Bilateral cataract resection IMPRESSION: 1. No acute finding. 2. Chronic small vessel ischemia. Electronically Signed   By: Monte Fantasia M.D.   On: 02/16/2018 08:39    Procedures Procedures (including critical care time)  Medications Ordered in ED Medications - No data to display   Initial Impression / Assessment and Plan / ED Course  I have reviewed the triage vital signs and the nursing notes.  Pertinent labs & imaging results that were available during my care of the patient were reviewed by me and considered in my medical decision making (see chart for details).    Patient has known history of Parkinson's disease.  He did have a mechanical fall today.  Patient does have a large hematoma.  CT does not show any acute.  Patient does not have a focal neurologic deficit.  At this time stable for discharge and continued monitoring by family members with head injury instructions.  Final Clinical Impressions(s) / ED Diagnoses   Final diagnoses:  Fall, initial encounter  Injury of head, initial encounter    ED  Discharge Orders    None       Charlesetta Shanks, MD 02/16/18 [phone]

## 2018-02-16 NOTE — ED Triage Notes (Addendum)
States he was getting up to go to the bathroom and fell wife found  Him lying on his right side , denies loc. States he just started taking Seroquel this past week for parkinson's and it makes him very dizzy and feels very drowsy for several hours. Small hematoma to right temporal area. Bruising to right shoulder . Denies sob.

## 2018-02-17 DIAGNOSIS — I252 Old myocardial infarction: Secondary | ICD-10-CM | POA: Diagnosis not present

## 2018-02-17 DIAGNOSIS — F3341 Major depressive disorder, recurrent, in partial remission: Secondary | ICD-10-CM | POA: Diagnosis not present

## 2018-02-17 DIAGNOSIS — E782 Mixed hyperlipidemia: Secondary | ICD-10-CM | POA: Diagnosis not present

## 2018-02-17 DIAGNOSIS — I2510 Atherosclerotic heart disease of native coronary artery without angina pectoris: Secondary | ICD-10-CM | POA: Diagnosis not present

## 2018-02-17 DIAGNOSIS — I10 Essential (primary) hypertension: Secondary | ICD-10-CM | POA: Diagnosis not present

## 2018-02-17 DIAGNOSIS — G20 Parkinson's disease: Secondary | ICD-10-CM | POA: Diagnosis not present

## 2018-02-21 DIAGNOSIS — E782 Mixed hyperlipidemia: Secondary | ICD-10-CM | POA: Diagnosis not present

## 2018-02-21 DIAGNOSIS — G20 Parkinson's disease: Secondary | ICD-10-CM | POA: Diagnosis not present

## 2018-02-21 DIAGNOSIS — I2510 Atherosclerotic heart disease of native coronary artery without angina pectoris: Secondary | ICD-10-CM | POA: Diagnosis not present

## 2018-02-21 DIAGNOSIS — I10 Essential (primary) hypertension: Secondary | ICD-10-CM | POA: Diagnosis not present

## 2018-02-21 DIAGNOSIS — I252 Old myocardial infarction: Secondary | ICD-10-CM | POA: Diagnosis not present

## 2018-02-21 DIAGNOSIS — F3341 Major depressive disorder, recurrent, in partial remission: Secondary | ICD-10-CM | POA: Diagnosis not present

## 2018-02-24 DIAGNOSIS — I252 Old myocardial infarction: Secondary | ICD-10-CM | POA: Diagnosis not present

## 2018-02-24 DIAGNOSIS — G20 Parkinson's disease: Secondary | ICD-10-CM | POA: Diagnosis not present

## 2018-02-24 DIAGNOSIS — E782 Mixed hyperlipidemia: Secondary | ICD-10-CM | POA: Diagnosis not present

## 2018-02-24 DIAGNOSIS — I2510 Atherosclerotic heart disease of native coronary artery without angina pectoris: Secondary | ICD-10-CM | POA: Diagnosis not present

## 2018-02-24 DIAGNOSIS — F3341 Major depressive disorder, recurrent, in partial remission: Secondary | ICD-10-CM | POA: Diagnosis not present

## 2018-02-24 DIAGNOSIS — I10 Essential (primary) hypertension: Secondary | ICD-10-CM | POA: Diagnosis not present

## 2018-02-28 DIAGNOSIS — I252 Old myocardial infarction: Secondary | ICD-10-CM | POA: Diagnosis not present

## 2018-02-28 DIAGNOSIS — I2510 Atherosclerotic heart disease of native coronary artery without angina pectoris: Secondary | ICD-10-CM | POA: Diagnosis not present

## 2018-02-28 DIAGNOSIS — I10 Essential (primary) hypertension: Secondary | ICD-10-CM | POA: Diagnosis not present

## 2018-02-28 DIAGNOSIS — F3341 Major depressive disorder, recurrent, in partial remission: Secondary | ICD-10-CM | POA: Diagnosis not present

## 2018-02-28 DIAGNOSIS — G20 Parkinson's disease: Secondary | ICD-10-CM | POA: Diagnosis not present

## 2018-02-28 DIAGNOSIS — E782 Mixed hyperlipidemia: Secondary | ICD-10-CM | POA: Diagnosis not present

## 2018-03-03 DIAGNOSIS — I2510 Atherosclerotic heart disease of native coronary artery without angina pectoris: Secondary | ICD-10-CM | POA: Diagnosis not present

## 2018-03-03 DIAGNOSIS — I10 Essential (primary) hypertension: Secondary | ICD-10-CM | POA: Diagnosis not present

## 2018-03-03 DIAGNOSIS — I252 Old myocardial infarction: Secondary | ICD-10-CM | POA: Diagnosis not present

## 2018-03-03 DIAGNOSIS — E782 Mixed hyperlipidemia: Secondary | ICD-10-CM | POA: Diagnosis not present

## 2018-03-03 DIAGNOSIS — G20 Parkinson's disease: Secondary | ICD-10-CM | POA: Diagnosis not present

## 2018-03-03 DIAGNOSIS — F3341 Major depressive disorder, recurrent, in partial remission: Secondary | ICD-10-CM | POA: Diagnosis not present

## 2018-03-06 DIAGNOSIS — F3341 Major depressive disorder, recurrent, in partial remission: Secondary | ICD-10-CM | POA: Diagnosis not present

## 2018-03-06 DIAGNOSIS — G20 Parkinson's disease: Secondary | ICD-10-CM | POA: Diagnosis not present

## 2018-03-06 DIAGNOSIS — I252 Old myocardial infarction: Secondary | ICD-10-CM | POA: Diagnosis not present

## 2018-03-06 DIAGNOSIS — E782 Mixed hyperlipidemia: Secondary | ICD-10-CM | POA: Diagnosis not present

## 2018-03-06 DIAGNOSIS — I2510 Atherosclerotic heart disease of native coronary artery without angina pectoris: Secondary | ICD-10-CM | POA: Diagnosis not present

## 2018-03-06 DIAGNOSIS — I10 Essential (primary) hypertension: Secondary | ICD-10-CM | POA: Diagnosis not present

## 2018-03-09 DIAGNOSIS — I2510 Atherosclerotic heart disease of native coronary artery without angina pectoris: Secondary | ICD-10-CM | POA: Diagnosis not present

## 2018-03-09 DIAGNOSIS — E782 Mixed hyperlipidemia: Secondary | ICD-10-CM | POA: Diagnosis not present

## 2018-03-09 DIAGNOSIS — F3341 Major depressive disorder, recurrent, in partial remission: Secondary | ICD-10-CM | POA: Diagnosis not present

## 2018-03-09 DIAGNOSIS — I252 Old myocardial infarction: Secondary | ICD-10-CM | POA: Diagnosis not present

## 2018-03-09 DIAGNOSIS — I10 Essential (primary) hypertension: Secondary | ICD-10-CM | POA: Diagnosis not present

## 2018-03-09 DIAGNOSIS — G20 Parkinson's disease: Secondary | ICD-10-CM | POA: Diagnosis not present

## 2018-03-12 DIAGNOSIS — I252 Old myocardial infarction: Secondary | ICD-10-CM | POA: Diagnosis not present

## 2018-03-12 DIAGNOSIS — I2510 Atherosclerotic heart disease of native coronary artery without angina pectoris: Secondary | ICD-10-CM | POA: Diagnosis not present

## 2018-03-12 DIAGNOSIS — I10 Essential (primary) hypertension: Secondary | ICD-10-CM | POA: Diagnosis not present

## 2018-03-12 DIAGNOSIS — F3341 Major depressive disorder, recurrent, in partial remission: Secondary | ICD-10-CM | POA: Diagnosis not present

## 2018-03-12 DIAGNOSIS — E782 Mixed hyperlipidemia: Secondary | ICD-10-CM | POA: Diagnosis not present

## 2018-03-12 DIAGNOSIS — G20 Parkinson's disease: Secondary | ICD-10-CM | POA: Diagnosis not present

## 2018-03-13 DIAGNOSIS — I252 Old myocardial infarction: Secondary | ICD-10-CM | POA: Diagnosis not present

## 2018-03-13 DIAGNOSIS — I2510 Atherosclerotic heart disease of native coronary artery without angina pectoris: Secondary | ICD-10-CM | POA: Diagnosis not present

## 2018-03-13 DIAGNOSIS — G20 Parkinson's disease: Secondary | ICD-10-CM | POA: Diagnosis not present

## 2018-03-13 DIAGNOSIS — I10 Essential (primary) hypertension: Secondary | ICD-10-CM | POA: Diagnosis not present

## 2018-03-13 DIAGNOSIS — E782 Mixed hyperlipidemia: Secondary | ICD-10-CM | POA: Diagnosis not present

## 2018-03-13 DIAGNOSIS — F3341 Major depressive disorder, recurrent, in partial remission: Secondary | ICD-10-CM | POA: Diagnosis not present

## 2018-03-14 DIAGNOSIS — G20 Parkinson's disease: Secondary | ICD-10-CM | POA: Diagnosis not present

## 2018-03-14 DIAGNOSIS — H5053 Vertical heterophoria: Secondary | ICD-10-CM | POA: Diagnosis not present

## 2018-03-14 DIAGNOSIS — Z8669 Personal history of other diseases of the nervous system and sense organs: Secondary | ICD-10-CM | POA: Diagnosis not present

## 2018-03-16 DIAGNOSIS — E782 Mixed hyperlipidemia: Secondary | ICD-10-CM | POA: Diagnosis not present

## 2018-03-16 DIAGNOSIS — G8929 Other chronic pain: Secondary | ICD-10-CM | POA: Diagnosis not present

## 2018-03-16 DIAGNOSIS — G20 Parkinson's disease: Secondary | ICD-10-CM | POA: Diagnosis not present

## 2018-03-16 DIAGNOSIS — R079 Chest pain, unspecified: Secondary | ICD-10-CM | POA: Diagnosis not present

## 2018-03-16 DIAGNOSIS — F3341 Major depressive disorder, recurrent, in partial remission: Secondary | ICD-10-CM | POA: Diagnosis not present

## 2018-03-16 DIAGNOSIS — I252 Old myocardial infarction: Secondary | ICD-10-CM | POA: Diagnosis not present

## 2018-03-16 DIAGNOSIS — J301 Allergic rhinitis due to pollen: Secondary | ICD-10-CM | POA: Diagnosis not present

## 2018-03-16 DIAGNOSIS — G4733 Obstructive sleep apnea (adult) (pediatric): Secondary | ICD-10-CM | POA: Diagnosis not present

## 2018-03-16 DIAGNOSIS — F411 Generalized anxiety disorder: Secondary | ICD-10-CM | POA: Diagnosis not present

## 2018-03-16 DIAGNOSIS — N200 Calculus of kidney: Secondary | ICD-10-CM | POA: Diagnosis not present

## 2018-03-16 DIAGNOSIS — G4700 Insomnia, unspecified: Secondary | ICD-10-CM | POA: Diagnosis not present

## 2018-03-16 DIAGNOSIS — E739 Lactose intolerance, unspecified: Secondary | ICD-10-CM | POA: Diagnosis not present

## 2018-03-16 DIAGNOSIS — K219 Gastro-esophageal reflux disease without esophagitis: Secondary | ICD-10-CM | POA: Diagnosis not present

## 2018-03-16 DIAGNOSIS — I119 Hypertensive heart disease without heart failure: Secondary | ICD-10-CM | POA: Diagnosis not present

## 2018-03-16 DIAGNOSIS — E559 Vitamin D deficiency, unspecified: Secondary | ICD-10-CM | POA: Diagnosis not present

## 2018-03-16 DIAGNOSIS — N401 Enlarged prostate with lower urinary tract symptoms: Secondary | ICD-10-CM | POA: Diagnosis not present

## 2018-03-16 DIAGNOSIS — I2510 Atherosclerotic heart disease of native coronary artery without angina pectoris: Secondary | ICD-10-CM | POA: Diagnosis not present

## 2018-03-22 DIAGNOSIS — G20 Parkinson's disease: Secondary | ICD-10-CM | POA: Diagnosis not present

## 2018-03-22 DIAGNOSIS — F3341 Major depressive disorder, recurrent, in partial remission: Secondary | ICD-10-CM | POA: Diagnosis not present

## 2018-03-22 DIAGNOSIS — I2510 Atherosclerotic heart disease of native coronary artery without angina pectoris: Secondary | ICD-10-CM | POA: Diagnosis not present

## 2018-03-22 DIAGNOSIS — I119 Hypertensive heart disease without heart failure: Secondary | ICD-10-CM | POA: Diagnosis not present

## 2018-03-22 DIAGNOSIS — F411 Generalized anxiety disorder: Secondary | ICD-10-CM | POA: Diagnosis not present

## 2018-03-22 DIAGNOSIS — E782 Mixed hyperlipidemia: Secondary | ICD-10-CM | POA: Diagnosis not present

## 2018-03-24 DIAGNOSIS — I2510 Atherosclerotic heart disease of native coronary artery without angina pectoris: Secondary | ICD-10-CM | POA: Diagnosis not present

## 2018-03-24 DIAGNOSIS — E782 Mixed hyperlipidemia: Secondary | ICD-10-CM | POA: Diagnosis not present

## 2018-03-24 DIAGNOSIS — F3341 Major depressive disorder, recurrent, in partial remission: Secondary | ICD-10-CM | POA: Diagnosis not present

## 2018-03-24 DIAGNOSIS — G20 Parkinson's disease: Secondary | ICD-10-CM | POA: Diagnosis not present

## 2018-03-24 DIAGNOSIS — I119 Hypertensive heart disease without heart failure: Secondary | ICD-10-CM | POA: Diagnosis not present

## 2018-03-24 DIAGNOSIS — F411 Generalized anxiety disorder: Secondary | ICD-10-CM | POA: Diagnosis not present

## 2018-03-27 DIAGNOSIS — R399 Unspecified symptoms and signs involving the genitourinary system: Secondary | ICD-10-CM | POA: Diagnosis not present

## 2018-03-27 DIAGNOSIS — G2581 Restless legs syndrome: Secondary | ICD-10-CM | POA: Diagnosis not present

## 2018-03-27 DIAGNOSIS — G4700 Insomnia, unspecified: Secondary | ICD-10-CM | POA: Diagnosis not present

## 2018-03-28 DIAGNOSIS — F411 Generalized anxiety disorder: Secondary | ICD-10-CM | POA: Diagnosis not present

## 2018-03-28 DIAGNOSIS — I119 Hypertensive heart disease without heart failure: Secondary | ICD-10-CM | POA: Diagnosis not present

## 2018-03-28 DIAGNOSIS — E782 Mixed hyperlipidemia: Secondary | ICD-10-CM | POA: Diagnosis not present

## 2018-03-28 DIAGNOSIS — F3341 Major depressive disorder, recurrent, in partial remission: Secondary | ICD-10-CM | POA: Diagnosis not present

## 2018-03-28 DIAGNOSIS — I2510 Atherosclerotic heart disease of native coronary artery without angina pectoris: Secondary | ICD-10-CM | POA: Diagnosis not present

## 2018-03-28 DIAGNOSIS — G20 Parkinson's disease: Secondary | ICD-10-CM | POA: Diagnosis not present

## 2018-03-30 DIAGNOSIS — E782 Mixed hyperlipidemia: Secondary | ICD-10-CM | POA: Diagnosis not present

## 2018-03-30 DIAGNOSIS — F3341 Major depressive disorder, recurrent, in partial remission: Secondary | ICD-10-CM | POA: Diagnosis not present

## 2018-03-30 DIAGNOSIS — I119 Hypertensive heart disease without heart failure: Secondary | ICD-10-CM | POA: Diagnosis not present

## 2018-03-30 DIAGNOSIS — G20 Parkinson's disease: Secondary | ICD-10-CM | POA: Diagnosis not present

## 2018-03-30 DIAGNOSIS — F411 Generalized anxiety disorder: Secondary | ICD-10-CM | POA: Diagnosis not present

## 2018-03-30 DIAGNOSIS — I2510 Atherosclerotic heart disease of native coronary artery without angina pectoris: Secondary | ICD-10-CM | POA: Diagnosis not present

## 2018-04-05 DIAGNOSIS — I2510 Atherosclerotic heart disease of native coronary artery without angina pectoris: Secondary | ICD-10-CM | POA: Diagnosis not present

## 2018-04-05 DIAGNOSIS — I119 Hypertensive heart disease without heart failure: Secondary | ICD-10-CM | POA: Diagnosis not present

## 2018-04-05 DIAGNOSIS — F3341 Major depressive disorder, recurrent, in partial remission: Secondary | ICD-10-CM | POA: Diagnosis not present

## 2018-04-05 DIAGNOSIS — G20 Parkinson's disease: Secondary | ICD-10-CM | POA: Diagnosis not present

## 2018-04-05 DIAGNOSIS — F411 Generalized anxiety disorder: Secondary | ICD-10-CM | POA: Diagnosis not present

## 2018-04-05 DIAGNOSIS — E782 Mixed hyperlipidemia: Secondary | ICD-10-CM | POA: Diagnosis not present

## 2018-04-06 DIAGNOSIS — H359 Unspecified retinal disorder: Secondary | ICD-10-CM | POA: Diagnosis not present

## 2018-04-06 DIAGNOSIS — H532 Diplopia: Secondary | ICD-10-CM | POA: Diagnosis not present

## 2018-04-06 DIAGNOSIS — G20 Parkinson's disease: Secondary | ICD-10-CM | POA: Diagnosis not present

## 2018-04-06 DIAGNOSIS — H04129 Dry eye syndrome of unspecified lacrimal gland: Secondary | ICD-10-CM | POA: Diagnosis not present

## 2018-04-06 DIAGNOSIS — H5053 Vertical heterophoria: Secondary | ICD-10-CM | POA: Diagnosis not present

## 2018-04-06 DIAGNOSIS — H538 Other visual disturbances: Secondary | ICD-10-CM | POA: Diagnosis not present

## 2018-04-07 DIAGNOSIS — G20 Parkinson's disease: Secondary | ICD-10-CM | POA: Diagnosis not present

## 2018-04-07 DIAGNOSIS — F411 Generalized anxiety disorder: Secondary | ICD-10-CM | POA: Diagnosis not present

## 2018-04-07 DIAGNOSIS — E782 Mixed hyperlipidemia: Secondary | ICD-10-CM | POA: Diagnosis not present

## 2018-04-07 DIAGNOSIS — I2510 Atherosclerotic heart disease of native coronary artery without angina pectoris: Secondary | ICD-10-CM | POA: Diagnosis not present

## 2018-04-07 DIAGNOSIS — F3341 Major depressive disorder, recurrent, in partial remission: Secondary | ICD-10-CM | POA: Diagnosis not present

## 2018-04-07 DIAGNOSIS — I119 Hypertensive heart disease without heart failure: Secondary | ICD-10-CM | POA: Diagnosis not present

## 2018-04-10 DIAGNOSIS — Z8669 Personal history of other diseases of the nervous system and sense organs: Secondary | ICD-10-CM | POA: Diagnosis not present

## 2018-04-10 DIAGNOSIS — H5022 Vertical strabismus, left eye: Secondary | ICD-10-CM | POA: Diagnosis not present

## 2018-04-10 DIAGNOSIS — H532 Diplopia: Secondary | ICD-10-CM | POA: Diagnosis not present

## 2018-04-10 DIAGNOSIS — G20 Parkinson's disease: Secondary | ICD-10-CM | POA: Diagnosis not present

## 2018-04-10 DIAGNOSIS — H5000 Unspecified esotropia: Secondary | ICD-10-CM | POA: Diagnosis not present

## 2018-04-11 DIAGNOSIS — E782 Mixed hyperlipidemia: Secondary | ICD-10-CM | POA: Diagnosis not present

## 2018-04-11 DIAGNOSIS — I119 Hypertensive heart disease without heart failure: Secondary | ICD-10-CM | POA: Diagnosis not present

## 2018-04-11 DIAGNOSIS — F3341 Major depressive disorder, recurrent, in partial remission: Secondary | ICD-10-CM | POA: Diagnosis not present

## 2018-04-11 DIAGNOSIS — I2510 Atherosclerotic heart disease of native coronary artery without angina pectoris: Secondary | ICD-10-CM | POA: Diagnosis not present

## 2018-04-11 DIAGNOSIS — G20 Parkinson's disease: Secondary | ICD-10-CM | POA: Diagnosis not present

## 2018-04-11 DIAGNOSIS — F411 Generalized anxiety disorder: Secondary | ICD-10-CM | POA: Diagnosis not present

## 2018-04-13 DIAGNOSIS — G20 Parkinson's disease: Secondary | ICD-10-CM | POA: Diagnosis not present

## 2018-04-13 DIAGNOSIS — E782 Mixed hyperlipidemia: Secondary | ICD-10-CM | POA: Diagnosis not present

## 2018-04-13 DIAGNOSIS — F3341 Major depressive disorder, recurrent, in partial remission: Secondary | ICD-10-CM | POA: Diagnosis not present

## 2018-04-13 DIAGNOSIS — F411 Generalized anxiety disorder: Secondary | ICD-10-CM | POA: Diagnosis not present

## 2018-04-13 DIAGNOSIS — I2510 Atherosclerotic heart disease of native coronary artery without angina pectoris: Secondary | ICD-10-CM | POA: Diagnosis not present

## 2018-04-13 DIAGNOSIS — I119 Hypertensive heart disease without heart failure: Secondary | ICD-10-CM | POA: Diagnosis not present

## 2018-04-15 DIAGNOSIS — K219 Gastro-esophageal reflux disease without esophagitis: Secondary | ICD-10-CM | POA: Diagnosis not present

## 2018-04-15 DIAGNOSIS — G4733 Obstructive sleep apnea (adult) (pediatric): Secondary | ICD-10-CM | POA: Diagnosis not present

## 2018-04-15 DIAGNOSIS — J301 Allergic rhinitis due to pollen: Secondary | ICD-10-CM | POA: Diagnosis not present

## 2018-04-15 DIAGNOSIS — E559 Vitamin D deficiency, unspecified: Secondary | ICD-10-CM | POA: Diagnosis not present

## 2018-04-15 DIAGNOSIS — I119 Hypertensive heart disease without heart failure: Secondary | ICD-10-CM | POA: Diagnosis not present

## 2018-04-15 DIAGNOSIS — F3341 Major depressive disorder, recurrent, in partial remission: Secondary | ICD-10-CM | POA: Diagnosis not present

## 2018-04-15 DIAGNOSIS — G4700 Insomnia, unspecified: Secondary | ICD-10-CM | POA: Diagnosis not present

## 2018-04-15 DIAGNOSIS — I2510 Atherosclerotic heart disease of native coronary artery without angina pectoris: Secondary | ICD-10-CM | POA: Diagnosis not present

## 2018-04-15 DIAGNOSIS — I252 Old myocardial infarction: Secondary | ICD-10-CM | POA: Diagnosis not present

## 2018-04-15 DIAGNOSIS — R079 Chest pain, unspecified: Secondary | ICD-10-CM | POA: Diagnosis not present

## 2018-04-15 DIAGNOSIS — E739 Lactose intolerance, unspecified: Secondary | ICD-10-CM | POA: Diagnosis not present

## 2018-04-15 DIAGNOSIS — N200 Calculus of kidney: Secondary | ICD-10-CM | POA: Diagnosis not present

## 2018-04-15 DIAGNOSIS — E782 Mixed hyperlipidemia: Secondary | ICD-10-CM | POA: Diagnosis not present

## 2018-04-15 DIAGNOSIS — F411 Generalized anxiety disorder: Secondary | ICD-10-CM | POA: Diagnosis not present

## 2018-04-15 DIAGNOSIS — G8929 Other chronic pain: Secondary | ICD-10-CM | POA: Diagnosis not present

## 2018-04-15 DIAGNOSIS — G20 Parkinson's disease: Secondary | ICD-10-CM | POA: Diagnosis not present

## 2018-04-15 DIAGNOSIS — N401 Enlarged prostate with lower urinary tract symptoms: Secondary | ICD-10-CM | POA: Diagnosis not present

## 2018-04-18 DIAGNOSIS — I119 Hypertensive heart disease without heart failure: Secondary | ICD-10-CM | POA: Diagnosis not present

## 2018-04-18 DIAGNOSIS — I2510 Atherosclerotic heart disease of native coronary artery without angina pectoris: Secondary | ICD-10-CM | POA: Diagnosis not present

## 2018-04-18 DIAGNOSIS — F3341 Major depressive disorder, recurrent, in partial remission: Secondary | ICD-10-CM | POA: Diagnosis not present

## 2018-04-18 DIAGNOSIS — F411 Generalized anxiety disorder: Secondary | ICD-10-CM | POA: Diagnosis not present

## 2018-04-18 DIAGNOSIS — E782 Mixed hyperlipidemia: Secondary | ICD-10-CM | POA: Diagnosis not present

## 2018-04-18 DIAGNOSIS — G20 Parkinson's disease: Secondary | ICD-10-CM | POA: Diagnosis not present

## 2018-04-20 DIAGNOSIS — F3341 Major depressive disorder, recurrent, in partial remission: Secondary | ICD-10-CM | POA: Diagnosis not present

## 2018-04-20 DIAGNOSIS — E782 Mixed hyperlipidemia: Secondary | ICD-10-CM | POA: Diagnosis not present

## 2018-04-20 DIAGNOSIS — I2510 Atherosclerotic heart disease of native coronary artery without angina pectoris: Secondary | ICD-10-CM | POA: Diagnosis not present

## 2018-04-20 DIAGNOSIS — G20 Parkinson's disease: Secondary | ICD-10-CM | POA: Diagnosis not present

## 2018-04-20 DIAGNOSIS — F411 Generalized anxiety disorder: Secondary | ICD-10-CM | POA: Diagnosis not present

## 2018-04-20 DIAGNOSIS — I119 Hypertensive heart disease without heart failure: Secondary | ICD-10-CM | POA: Diagnosis not present

## 2018-04-25 DIAGNOSIS — E782 Mixed hyperlipidemia: Secondary | ICD-10-CM | POA: Diagnosis not present

## 2018-04-25 DIAGNOSIS — G20 Parkinson's disease: Secondary | ICD-10-CM | POA: Diagnosis not present

## 2018-04-25 DIAGNOSIS — I119 Hypertensive heart disease without heart failure: Secondary | ICD-10-CM | POA: Diagnosis not present

## 2018-04-25 DIAGNOSIS — I2510 Atherosclerotic heart disease of native coronary artery without angina pectoris: Secondary | ICD-10-CM | POA: Diagnosis not present

## 2018-04-25 DIAGNOSIS — F3341 Major depressive disorder, recurrent, in partial remission: Secondary | ICD-10-CM | POA: Diagnosis not present

## 2018-04-25 DIAGNOSIS — F411 Generalized anxiety disorder: Secondary | ICD-10-CM | POA: Diagnosis not present

## 2018-04-27 DIAGNOSIS — F3341 Major depressive disorder, recurrent, in partial remission: Secondary | ICD-10-CM | POA: Diagnosis not present

## 2018-04-27 DIAGNOSIS — E782 Mixed hyperlipidemia: Secondary | ICD-10-CM | POA: Diagnosis not present

## 2018-04-27 DIAGNOSIS — I119 Hypertensive heart disease without heart failure: Secondary | ICD-10-CM | POA: Diagnosis not present

## 2018-04-27 DIAGNOSIS — I2510 Atherosclerotic heart disease of native coronary artery without angina pectoris: Secondary | ICD-10-CM | POA: Diagnosis not present

## 2018-04-27 DIAGNOSIS — F411 Generalized anxiety disorder: Secondary | ICD-10-CM | POA: Diagnosis not present

## 2018-04-27 DIAGNOSIS — G20 Parkinson's disease: Secondary | ICD-10-CM | POA: Diagnosis not present

## 2018-05-01 DIAGNOSIS — N401 Enlarged prostate with lower urinary tract symptoms: Secondary | ICD-10-CM | POA: Diagnosis not present

## 2018-05-01 DIAGNOSIS — N200 Calculus of kidney: Secondary | ICD-10-CM | POA: Diagnosis not present

## 2018-05-01 DIAGNOSIS — R351 Nocturia: Secondary | ICD-10-CM | POA: Diagnosis not present

## 2018-05-02 DIAGNOSIS — F3341 Major depressive disorder, recurrent, in partial remission: Secondary | ICD-10-CM | POA: Diagnosis not present

## 2018-05-02 DIAGNOSIS — E782 Mixed hyperlipidemia: Secondary | ICD-10-CM | POA: Diagnosis not present

## 2018-05-02 DIAGNOSIS — I2510 Atherosclerotic heart disease of native coronary artery without angina pectoris: Secondary | ICD-10-CM | POA: Diagnosis not present

## 2018-05-02 DIAGNOSIS — F411 Generalized anxiety disorder: Secondary | ICD-10-CM | POA: Diagnosis not present

## 2018-05-02 DIAGNOSIS — G20 Parkinson's disease: Secondary | ICD-10-CM | POA: Diagnosis not present

## 2018-05-02 DIAGNOSIS — I119 Hypertensive heart disease without heart failure: Secondary | ICD-10-CM | POA: Diagnosis not present

## 2018-05-05 DIAGNOSIS — F411 Generalized anxiety disorder: Secondary | ICD-10-CM | POA: Diagnosis not present

## 2018-05-05 DIAGNOSIS — E782 Mixed hyperlipidemia: Secondary | ICD-10-CM | POA: Diagnosis not present

## 2018-05-05 DIAGNOSIS — I119 Hypertensive heart disease without heart failure: Secondary | ICD-10-CM | POA: Diagnosis not present

## 2018-05-05 DIAGNOSIS — F3341 Major depressive disorder, recurrent, in partial remission: Secondary | ICD-10-CM | POA: Diagnosis not present

## 2018-05-05 DIAGNOSIS — G20 Parkinson's disease: Secondary | ICD-10-CM | POA: Diagnosis not present

## 2018-05-05 DIAGNOSIS — I2510 Atherosclerotic heart disease of native coronary artery without angina pectoris: Secondary | ICD-10-CM | POA: Diagnosis not present

## 2018-05-09 DIAGNOSIS — I119 Hypertensive heart disease without heart failure: Secondary | ICD-10-CM | POA: Diagnosis not present

## 2018-05-09 DIAGNOSIS — I2510 Atherosclerotic heart disease of native coronary artery without angina pectoris: Secondary | ICD-10-CM | POA: Diagnosis not present

## 2018-05-09 DIAGNOSIS — F3341 Major depressive disorder, recurrent, in partial remission: Secondary | ICD-10-CM | POA: Diagnosis not present

## 2018-05-09 DIAGNOSIS — F411 Generalized anxiety disorder: Secondary | ICD-10-CM | POA: Diagnosis not present

## 2018-05-09 DIAGNOSIS — G20 Parkinson's disease: Secondary | ICD-10-CM | POA: Diagnosis not present

## 2018-05-09 DIAGNOSIS — E782 Mixed hyperlipidemia: Secondary | ICD-10-CM | POA: Diagnosis not present

## 2018-05-11 DIAGNOSIS — I2510 Atherosclerotic heart disease of native coronary artery without angina pectoris: Secondary | ICD-10-CM | POA: Diagnosis not present

## 2018-05-11 DIAGNOSIS — G20 Parkinson's disease: Secondary | ICD-10-CM | POA: Diagnosis not present

## 2018-05-11 DIAGNOSIS — I119 Hypertensive heart disease without heart failure: Secondary | ICD-10-CM | POA: Diagnosis not present

## 2018-05-11 DIAGNOSIS — E782 Mixed hyperlipidemia: Secondary | ICD-10-CM | POA: Diagnosis not present

## 2018-05-11 DIAGNOSIS — F3341 Major depressive disorder, recurrent, in partial remission: Secondary | ICD-10-CM | POA: Diagnosis not present

## 2018-05-11 DIAGNOSIS — F411 Generalized anxiety disorder: Secondary | ICD-10-CM | POA: Diagnosis not present

## 2018-05-15 DIAGNOSIS — J301 Allergic rhinitis due to pollen: Secondary | ICD-10-CM | POA: Diagnosis not present

## 2018-05-15 DIAGNOSIS — N400 Enlarged prostate without lower urinary tract symptoms: Secondary | ICD-10-CM | POA: Diagnosis not present

## 2018-05-15 DIAGNOSIS — J300 Vasomotor rhinitis: Secondary | ICD-10-CM | POA: Diagnosis not present

## 2018-05-15 DIAGNOSIS — Z7982 Long term (current) use of aspirin: Secondary | ICD-10-CM | POA: Diagnosis not present

## 2018-05-15 DIAGNOSIS — N401 Enlarged prostate with lower urinary tract symptoms: Secondary | ICD-10-CM | POA: Diagnosis not present

## 2018-05-15 DIAGNOSIS — N200 Calculus of kidney: Secondary | ICD-10-CM | POA: Diagnosis not present

## 2018-05-15 DIAGNOSIS — E782 Mixed hyperlipidemia: Secondary | ICD-10-CM | POA: Diagnosis not present

## 2018-05-15 DIAGNOSIS — G8929 Other chronic pain: Secondary | ICD-10-CM | POA: Diagnosis not present

## 2018-05-15 DIAGNOSIS — E559 Vitamin D deficiency, unspecified: Secondary | ICD-10-CM | POA: Diagnosis not present

## 2018-05-15 DIAGNOSIS — Z9181 History of falling: Secondary | ICD-10-CM | POA: Diagnosis not present

## 2018-05-15 DIAGNOSIS — F3341 Major depressive disorder, recurrent, in partial remission: Secondary | ICD-10-CM | POA: Diagnosis not present

## 2018-05-15 DIAGNOSIS — G4700 Insomnia, unspecified: Secondary | ICD-10-CM | POA: Diagnosis not present

## 2018-05-15 DIAGNOSIS — K219 Gastro-esophageal reflux disease without esophagitis: Secondary | ICD-10-CM | POA: Diagnosis not present

## 2018-05-15 DIAGNOSIS — F419 Anxiety disorder, unspecified: Secondary | ICD-10-CM | POA: Diagnosis not present

## 2018-05-15 DIAGNOSIS — I119 Hypertensive heart disease without heart failure: Secondary | ICD-10-CM | POA: Diagnosis not present

## 2018-05-15 DIAGNOSIS — I252 Old myocardial infarction: Secondary | ICD-10-CM | POA: Diagnosis not present

## 2018-05-15 DIAGNOSIS — F411 Generalized anxiety disorder: Secondary | ICD-10-CM | POA: Diagnosis not present

## 2018-05-15 DIAGNOSIS — G4733 Obstructive sleep apnea (adult) (pediatric): Secondary | ICD-10-CM | POA: Diagnosis not present

## 2018-05-15 DIAGNOSIS — G20 Parkinson's disease: Secondary | ICD-10-CM | POA: Diagnosis not present

## 2018-05-15 DIAGNOSIS — I10 Essential (primary) hypertension: Secondary | ICD-10-CM | POA: Diagnosis not present

## 2018-05-15 DIAGNOSIS — E739 Lactose intolerance, unspecified: Secondary | ICD-10-CM | POA: Diagnosis not present

## 2018-05-15 DIAGNOSIS — I2510 Atherosclerotic heart disease of native coronary artery without angina pectoris: Secondary | ICD-10-CM | POA: Diagnosis not present

## 2018-05-15 DIAGNOSIS — E7800 Pure hypercholesterolemia, unspecified: Secondary | ICD-10-CM | POA: Diagnosis not present

## 2018-05-16 DIAGNOSIS — E782 Mixed hyperlipidemia: Secondary | ICD-10-CM | POA: Diagnosis not present

## 2018-05-16 DIAGNOSIS — I2510 Atherosclerotic heart disease of native coronary artery without angina pectoris: Secondary | ICD-10-CM | POA: Diagnosis not present

## 2018-05-16 DIAGNOSIS — G20 Parkinson's disease: Secondary | ICD-10-CM | POA: Diagnosis not present

## 2018-05-16 DIAGNOSIS — F3341 Major depressive disorder, recurrent, in partial remission: Secondary | ICD-10-CM | POA: Diagnosis not present

## 2018-05-16 DIAGNOSIS — F411 Generalized anxiety disorder: Secondary | ICD-10-CM | POA: Diagnosis not present

## 2018-05-16 DIAGNOSIS — I119 Hypertensive heart disease without heart failure: Secondary | ICD-10-CM | POA: Diagnosis not present

## 2018-05-17 ENCOUNTER — Encounter: Payer: Self-pay | Admitting: Neurology

## 2018-05-19 DIAGNOSIS — F3341 Major depressive disorder, recurrent, in partial remission: Secondary | ICD-10-CM | POA: Diagnosis not present

## 2018-05-19 DIAGNOSIS — E782 Mixed hyperlipidemia: Secondary | ICD-10-CM | POA: Diagnosis not present

## 2018-05-19 DIAGNOSIS — I2510 Atherosclerotic heart disease of native coronary artery without angina pectoris: Secondary | ICD-10-CM | POA: Diagnosis not present

## 2018-05-19 DIAGNOSIS — F411 Generalized anxiety disorder: Secondary | ICD-10-CM | POA: Diagnosis not present

## 2018-05-19 DIAGNOSIS — G20 Parkinson's disease: Secondary | ICD-10-CM | POA: Diagnosis not present

## 2018-05-19 DIAGNOSIS — I119 Hypertensive heart disease without heart failure: Secondary | ICD-10-CM | POA: Diagnosis not present

## 2018-05-23 DIAGNOSIS — E782 Mixed hyperlipidemia: Secondary | ICD-10-CM | POA: Diagnosis not present

## 2018-05-23 DIAGNOSIS — I2510 Atherosclerotic heart disease of native coronary artery without angina pectoris: Secondary | ICD-10-CM | POA: Diagnosis not present

## 2018-05-23 DIAGNOSIS — I119 Hypertensive heart disease without heart failure: Secondary | ICD-10-CM | POA: Diagnosis not present

## 2018-05-23 DIAGNOSIS — F411 Generalized anxiety disorder: Secondary | ICD-10-CM | POA: Diagnosis not present

## 2018-05-23 DIAGNOSIS — G20 Parkinson's disease: Secondary | ICD-10-CM | POA: Diagnosis not present

## 2018-05-23 DIAGNOSIS — F3341 Major depressive disorder, recurrent, in partial remission: Secondary | ICD-10-CM | POA: Diagnosis not present

## 2018-05-25 DIAGNOSIS — F411 Generalized anxiety disorder: Secondary | ICD-10-CM | POA: Diagnosis not present

## 2018-05-25 DIAGNOSIS — I2510 Atherosclerotic heart disease of native coronary artery without angina pectoris: Secondary | ICD-10-CM | POA: Diagnosis not present

## 2018-05-25 DIAGNOSIS — F3341 Major depressive disorder, recurrent, in partial remission: Secondary | ICD-10-CM | POA: Diagnosis not present

## 2018-05-25 DIAGNOSIS — G20 Parkinson's disease: Secondary | ICD-10-CM | POA: Diagnosis not present

## 2018-05-25 DIAGNOSIS — E782 Mixed hyperlipidemia: Secondary | ICD-10-CM | POA: Diagnosis not present

## 2018-05-25 DIAGNOSIS — I119 Hypertensive heart disease without heart failure: Secondary | ICD-10-CM | POA: Diagnosis not present

## 2018-05-30 DIAGNOSIS — G20 Parkinson's disease: Secondary | ICD-10-CM | POA: Diagnosis not present

## 2018-05-30 DIAGNOSIS — I2510 Atherosclerotic heart disease of native coronary artery without angina pectoris: Secondary | ICD-10-CM | POA: Diagnosis not present

## 2018-05-30 DIAGNOSIS — F411 Generalized anxiety disorder: Secondary | ICD-10-CM | POA: Diagnosis not present

## 2018-05-30 DIAGNOSIS — F3341 Major depressive disorder, recurrent, in partial remission: Secondary | ICD-10-CM | POA: Diagnosis not present

## 2018-05-30 DIAGNOSIS — E782 Mixed hyperlipidemia: Secondary | ICD-10-CM | POA: Diagnosis not present

## 2018-05-30 DIAGNOSIS — I119 Hypertensive heart disease without heart failure: Secondary | ICD-10-CM | POA: Diagnosis not present

## 2018-06-01 DIAGNOSIS — G20 Parkinson's disease: Secondary | ICD-10-CM | POA: Diagnosis not present

## 2018-06-01 DIAGNOSIS — F3341 Major depressive disorder, recurrent, in partial remission: Secondary | ICD-10-CM | POA: Diagnosis not present

## 2018-06-01 DIAGNOSIS — I119 Hypertensive heart disease without heart failure: Secondary | ICD-10-CM | POA: Diagnosis not present

## 2018-06-01 DIAGNOSIS — E782 Mixed hyperlipidemia: Secondary | ICD-10-CM | POA: Diagnosis not present

## 2018-06-01 DIAGNOSIS — F411 Generalized anxiety disorder: Secondary | ICD-10-CM | POA: Diagnosis not present

## 2018-06-01 DIAGNOSIS — I2510 Atherosclerotic heart disease of native coronary artery without angina pectoris: Secondary | ICD-10-CM | POA: Diagnosis not present

## 2018-06-06 DIAGNOSIS — F3341 Major depressive disorder, recurrent, in partial remission: Secondary | ICD-10-CM | POA: Diagnosis not present

## 2018-06-06 DIAGNOSIS — E782 Mixed hyperlipidemia: Secondary | ICD-10-CM | POA: Diagnosis not present

## 2018-06-06 DIAGNOSIS — I119 Hypertensive heart disease without heart failure: Secondary | ICD-10-CM | POA: Diagnosis not present

## 2018-06-06 DIAGNOSIS — I2510 Atherosclerotic heart disease of native coronary artery without angina pectoris: Secondary | ICD-10-CM | POA: Diagnosis not present

## 2018-06-06 DIAGNOSIS — G20 Parkinson's disease: Secondary | ICD-10-CM | POA: Diagnosis not present

## 2018-06-06 DIAGNOSIS — F411 Generalized anxiety disorder: Secondary | ICD-10-CM | POA: Diagnosis not present

## 2018-06-07 ENCOUNTER — Other Ambulatory Visit: Payer: Self-pay

## 2018-06-08 NOTE — Progress Notes (Signed)
Virtual Visit via Video Note The purpose of this virtual visit is to provide medical care while limiting exposure to the novel coronavirus.    Consent was obtained for video visit:  Yes.   Answered questions that patient had about telehealth interaction:  Yes.   I discussed the limitations, risks, security and privacy concerns of performing an evaluation and management service by telemedicine. I also discussed with the patient that there may be a patient responsible charge related to this service. The patient expressed understanding and agreed to proceed.  Pt location: Home Physician Location: home Name of referring provider:  Shirline Frees, MD I connected with Paul Ortiz at patients initiation/request on 06/09/2018 at  2:30 PM EDT by video enabled telemedicine application and verified that I am speaking with the correct person using two identifiers. Pt MRN:  [id number] Pt DOB:  08-Jun-1945 Video Participants:  [name];  [name] wife, who supplements the history    The consultation is for the evaluation of Parkinson's disease.  I have reviewed numerous records from his previous neurologist as well as neuro-ophthalmologist, which took me 35 minutes of non-face-to-face time.  In brief, the patient developed parkinsonism symptoms in 2011 which consisted of decreased arm swing, stooped posture and some trouble ambulating.  Records really do not start for neurology that I can review until 2015.  He saw Dr. Hassell Done of neuro-ophthalmology in 2015 for occasional fleeting diplopia.  A small left afferent pupillary defect was identified as well as a left ankle hyperphoria.  Prisms were recommended.  In March, 2018, the patient wanted to be worked up for DBS therapy.  He underwent a levodopa challenge test.  His off score was 42 and on score was 24.  He was thought to be a potential candidate and then April, 2018 was referred to Dr. Arlan Organ, who agreed he was a possible candidate and recommended  GPI DBS.  He had neurocognitive testing in August, 2018.  While he did not meet the criteria for dementia at that time, there was concerns that he was a cognitive risk, and there was also concerns about his depression.  He was offered DBS, as well as Duopa and ultimately he decided on Duopa, and the tube for this was placed in November, 2018.  He was started on this in December, 2018 and the patient felt "out of it."  He did not care for Duopa therapy and ultimately discontinued it after a 4 month trial.   The patient was last seen in February 16, 2018 by neurology, at which point he had increased confusion, delusions and hallucinations.  It is noted in that notes that the patient was started on quetiapine in the hospital the prior week and was worked up to 100 mg nightly.  he is off of that now.  He was started on Aricept last visit with neurology on February 16, 2018 but wife reports that made hallucinations worse and d/c that.    Wife reports that pcp started gabapentin 100mg  - 2 tablets at night - for leg pain and sleep.  It may help leg pain but doesn't help sleep.  Increasing dose caused hang over effect.  They think that perhaps this may have increased hallucinations, although they are not like they were when he was on Aricept.    Patient's disease has been complicated by orthostatic hypotension, wearing off, freezing/start hesitation, [place] change with delusions and hallucinations.  Patient's current movement disorder medications include: Carbidopa/levodopa 25/250, 1 tablet 3 times  per day (8:30am/12pm/5-6pm) Carbidopa/levodopa 50/200 at bedtime  Rotigotine patch, 3 mg daily  Specific Symptoms:  Tremor: No. Family hx of similar:  No. Voice: yes, last voice therapy was "quite some time ago" Sleep: trouble staying asleep (partly because have to urinate and partly because can't turn over in bed - has a sleep number bed)  Vivid Dreams:  Yes.    Acting out dreams:  No. Wet Pillows: Yes.    Postural symptoms:  Yes.    Falls?  Yes.  , 2 falls per month approximately, no hx of fracture Bradykinesia symptoms: shuffling gait, slow movements, difficulty getting out of a chair and difficulty regaining balance Loss of smell:  Yes.   Loss of taste:  No. Urinary Incontinence:  Yes.  , wears depends x several months but gotten worse over the last week Difficulty Swallowing:  Yes.  minor with liquids Handwriting, micrographia: Yes.   Trouble with ADL's:  Yes.  , home health care comes in 5 days per week 9am-noon and does bathing  Trouble buttoning clothing: Yes.   Depression:  Yes.   , because lack of ability to see/no close friends [place]onte changes:  Yes.   Hallucinations:  Yes.  , worse with aricept per wife but still thinks people in house or standing outside  visual distortions: Yes.   N/V:  No. Lightheaded:  Yes.  , some and feels that "blood sugar may be low"  Syncope: No. Diplopia:  Yes.   Dyskinesia:  No. Prior exposure to reglan/antipsychotics: No.   PREVIOUS MEDICATIONS:  selegiline (lightheadedness) pramipexole (stated side effects but did not state what the side effects were); aricept (made hallucinations worse)  ALLERGIES:   Allergies  Allergen Reactions  . Lactose Intolerance (Gi) Diarrhea  . Penicillins Rash    CURRENT MEDICATIONS:  Outpatient Encounter Medications as of 06/09/2018  Medication Sig  . acetaminophen (TYLENOL) 500 MG tablet Take 500 mg by mouth every 6 (six) hours as needed. Take 2 tab  . ALPRAZolam (XANAX) 1 MG tablet Take 1 mg by mouth at bedtime.  Marland Kitchen aspirin 325 MG tablet Take 325 mg by mouth daily.  . B Complex Vitamins (VITAMIN B COMPLEX PO) Take 1 capsule by mouth daily.  . carbidopa-levodopa (SINEMET CR) 50-200 MG tablet Take 1 tablet by mouth at bedtime.  . carbidopa-levodopa (SINEMET IR) 25-250 MG tablet Take 1 tablet by mouth 3 (three) times daily.  . Cholecalciferol (VITAMIN D-1000 MAX ST) 1000 units tablet Take 1 tablet by mouth daily.   Marland Kitchen gabapentin (NEURONTIN) 100 MG capsule Take 100 mg by mouth 2 (two) times daily.  Marland Kitchen ibuprofen (ADVIL) 200 MG tablet Take 200 mg by mouth every 6 (six) hours as needed. Take 2 tab daily  . ipratropium (ATROVENT) 0.06 % nasal spray Place 2 sprays into the nose as needed.  . Magnesium 500 MG CAPS Take by mouth. Take half tab at noon  . metoprolol succinate (TOPROL-XL) 25 MG 24 hr tablet Take 25 mg by mouth at bedtime. Half in am and half at night  . Omega-3 Fatty Acids (FISH OIL) 1000 MG CAPS Take 1,000 mg by mouth daily.  . ondansetron (ZOFRAN) 8 MG tablet Take 8 mg by mouth as needed.  . potassium chloride (MICRO-K) 10 MEQ CR capsule Take 1 capsule by mouth daily.  . Rotigotine 3 MG/24HR PT24 Place 1 patch onto the skin daily.  . tamsulosin (FLOMAX) 0.4 MG CAPS capsule Take 0.4 mg by mouth daily.  . furosemide (LASIX) 20 MG  tablet Take 20 mg by mouth daily. Take half in the morning  . oxybutynin (DITROPAN) 5 MG tablet Take 5 mg by mouth as needed.  Marland Kitchen QUEtiapine (SEROQUEL) 25 MG tablet Take 0.5 tablets (12.5 mg total) by mouth at bedtime. (Patient not taking: Reported on 06/09/2018)  . [DISCONTINUED] atorvastatin (LIPITOR) 20 MG tablet Take 20 mg by mouth daily.   No facility-administered encounter medications on file as of 06/09/2018.     PAST MEDICAL HISTORY:   Past Medical History:  Diagnosis Date  . Abdominal wall hernia   . BPH with obstruction/lower urinary tract symptoms   . Central sleep apnea   . Coronary atherosclerosis of native coronary artery   . Hypercholesteremia   . Hypertension   . Insomnia   . Major depression   . MI (myocardial infarction) ([place]) 2005  . Mixed hyperlipidemia   . OSA (obstructive sleep apnea)   . Parkinson disease ([place])   . Vasomotor rhinitis     PAST SURGICAL HISTORY:   Past Surgical History:  Procedure Laterality Date  . CARDIAC SURGERY    . CORONARY ARTERY BYPASS GRAFT  2005    SOCIAL HISTORY:   Social History   Socioeconomic History  .  Marital status: Married    Spouse name: Not on file  . Number of children: Not on file  . Years of education: Not on file  . Highest education level: Not on file  Occupational History  . Not on file  Social Needs  . Financial resource strain: Not on file  . Food insecurity:    Worry: Not on file    Inability: Not on file  . Transportation needs:    Medical: Not on file    Non-medical: Not on file  Tobacco Use  . Smoking status: Never Smoker  . Smokeless tobacco: Never Used  Substance and Sexual Activity  . Alcohol use: No  . Drug use: No  . Sexual activity: Never  Lifestyle  . Physical activity:    Days per week: Not on file    Minutes per session: Not on file  . Stress: Not on file  Relationships  . Social connections:    Talks on phone: Not on file    Gets together: Not on file    Attends religious service: Not on file    Active member of club or organization: Not on file    Attends meetings of clubs or organizations: Not on file    Relationship status: Not on file  . Intimate partner violence:    Fear of current or ex partner: Not on file    Emotionally abused: Not on file    Physically abused: Not on file    Forced sexual activity: Not on file  Other Topics Concern  . Not on file  Social History Narrative  . Not on file    FAMILY HISTORY:   Family Status  Relation Name Status  . MGM  Deceased  . MGF  Deceased  . PGM  Deceased  . PGF  Deceased    ROS:  Review of Systems  Constitutional: Negative.   HENT: Negative.   Eyes: Positive for double vision.  Respiratory: Negative.   Cardiovascular: Negative.   Gastrointestinal: Negative.   Genitourinary: Positive for frequency and urgency.  Musculoskeletal: Positive for back pain and falls.  Skin: Negative.   Endo/Heme/Allergies: Negative.   Psychiatric/Behavioral: Positive for depression and hallucinations.    PHYSICAL EXAMINATION:    VITALS:   Vitals:  06/09/18 1002  Weight: 174 lb (78.9 kg)   Height: 6' (1.829 m)    GEN:  The patient appears stated age and is in NAD.  Neurological examination: Orientation: Montreal Cognitive Assessment  06/07/2018  Visuospatial/ Executive (0/5) 1  Naming (0/3) 3  Attention: Read list of digits (0/2) 2  Attention: Read list of letters (0/1) 0  Attention: Serial 7 subtraction starting at 100 (0/3) 2  Language: Repeat phrase (0/2) 1  Language : Fluency (0/1) 0  Abstraction (0/2) 2  Delayed Recall (0/5) 1  Orientation (0/6) 5  Total 17  Adjusted Score (based on education) 18   Cranial nerves: There is good facial symmetry. Extraocular muscles are intact with the exception of upgaze paresis. The visual fields are full to confrontational testing. The speech is fluent and clear. Soft palate rises symmetrically and there is no tongue deviation. Hearing is intact to conversational tone. Sensation: unable to test sensitive aspect via video Motor: Strength is at least antigravity x 4.   There is no pronator drift. Deep tendon reflexes: unable via video  Movement examination: Tone: unable Abnormal movements: None Coordination:  There is just very slight deformation with finger taps and hand opening and closing on the right.  Otherwise, rapid alternating movements were actually quite good in the upper and lower extremities bilaterally. Gait and Station: The patient pushes off of his transport chair to arise.  He has a Radiation protection practitioner.  He is short stepped and occasionally will shuffle, but walks fairly well with a Rollator.  ASSESSMENT/PLAN:  1.  Parkinson's disease, diagnosed in 2011  -Patient's Parkinson's disease has now been complicated by orthostatic hypotension, wearing off, confusion/delusions/hallucinations  -We discussed the diagnosis as well as pathophysiology of the disease.  We discussed treatment options as well as prognostic indicators.  Patient education was provided.  -We discussed that it used to be thought that levodopa would increase  risk of melanoma but now it is believed that Parkinsons itself likely increases risk of melanoma. he is to get regular skin checks.  -  We talked about safety in the home.  -We decided to continue the carbidopa/levodopa 25/250, 1 tablet 3 times per day  -We decided to continue the carbidopa/levodopa 50/200 CR at bedtime  -We had a long discussion regarding the patient's rotigotine, 3 mg.  We discussed that the dopamine agonist can increase confusion and hallucinations.  I think it is time to start to wean and ultimately discontinue that.  The patient is nervous about that, and he just got a new supply of 90 days from the [facility].  He is going to try and very slowly decrease the dose.  He will just take a 3 mg patch and cut a quarter of the patch off of it and see how he does with that.  Clinically, I do not think he will notice any difference with this small dose reduction.  I told him I could write him a prescription for the 2 mg patch, but they will try this first.  Ultimately, I would like to see this gone in the future and we could compensate by increasing the levodopa somewhat.  -Patient has had a duopa tube placed, but did not care for that and had that reversed  -We discussed community resources in the area including patient support groups and community exercise programs for PD and pt education was provided to the patient.  -introduced my Education officer, museum as part of multidisciplinary team and she will call them  we will introduce herself virtually.  -Long discussion with the patient about getting integrated into a Parkinson's network.  Discussed exactly what that meant, as he is somewhat resistant to doing so.  -Discussed counseling, as I think he needs that.  2.  Parkinson's disease dementia with confusion/hallucinations  -As above, I am working on decreasing the rotigotine, with hopes of ultimately stopping that in the future.  -I think that perhaps the gabapentin has increased  hallucinations.  They will talk with primary care provider about that.  -Patient was on quetiapine, but is currently off of that.  Hallucinations are better than when he was on Aricept.  -Patient asked me about his Xanax.  States that he has tried to get off of it multiple times.  Discussed with him that this is obviously not something that I want him on as it can cause confusion, falls and hallucinations, but we are changing multiple other things and I would not recommend changing that right this second.  This is not a medication that I prescribe.  3.  Neurogenic orthostatic hypotension  -Patient was under the impression that when he feels lightheaded that this is a blood sugar problem.  I suspect that this is just orthostasis, as was previously identified in his [facility] records as well.  He needs to increase his water intake.  4.  Intermittent diplopia  -Patient has seen neuro-ophthalmology a few times at [facility], with no changes between 2015 and 2020.  Patient has had identified a relative small afferent pupillary defect on the left as well as a left ankle hyperphoria, for which prisms have been recommended.  Follow Up Instructions:  Follow-up with me in the next 4 to 5 months, sooner should new neurologic issues arise.  -I discussed the assessment and treatment plan with the patient. The patient was provided an opportunity to ask questions and all were answered. The patient agreed with the plan and demonstrated an understanding of the instructions.   The patient was advised to call back or seek an in-person evaluation if the symptoms worsen or if the condition fails to improve as anticipated.    Total Time spent in visit with the patient was: 60 minutes, of which more than 50% of the time was spent in counseling and/or coordinating care, as above.  This did not include the 40 min of record review which was detailed above, which was non face to face time.   Pt understands and agrees with the  plan of care outlined.     Alonza Bogus, DO

## 2018-06-09 ENCOUNTER — Telehealth (INDEPENDENT_AMBULATORY_CARE_PROVIDER_SITE_OTHER): Payer: Medicare Other | Admitting: Neurology

## 2018-06-09 ENCOUNTER — Other Ambulatory Visit: Payer: Self-pay

## 2018-06-09 ENCOUNTER — Encounter: Payer: Self-pay | Admitting: Neurology

## 2018-06-09 DIAGNOSIS — G903 Multi-system degeneration of the autonomic nervous system: Secondary | ICD-10-CM | POA: Diagnosis not present

## 2018-06-09 DIAGNOSIS — F0280 Dementia in other diseases classified elsewhere without behavioral disturbance: Secondary | ICD-10-CM | POA: Diagnosis not present

## 2018-06-09 DIAGNOSIS — G20 Parkinson's disease: Secondary | ICD-10-CM | POA: Diagnosis not present

## 2018-06-12 ENCOUNTER — Telehealth: Payer: Self-pay | Admitting: Neurology

## 2018-06-13 ENCOUNTER — Telehealth: Payer: Self-pay | Admitting: Neurology

## 2018-06-13 NOTE — Telephone Encounter (Signed)
I don't think that is PD related at all.  Glad they had not yet reduced the patch or they would have thought it was related to that.  If terrible HA, needs ER.  Sounds like needs labs with PCP, including UA to r/o UTI.  Glad she made appointment with PCP.

## 2018-06-14 ENCOUNTER — Emergency Department (HOSPITAL_COMMUNITY): Payer: Medicare Other

## 2018-06-14 ENCOUNTER — Other Ambulatory Visit: Payer: Self-pay

## 2018-06-14 ENCOUNTER — Encounter (HOSPITAL_COMMUNITY): Payer: Self-pay

## 2018-06-14 ENCOUNTER — Emergency Department (HOSPITAL_COMMUNITY)
Admission: EM | Admit: 2018-06-14 | Discharge: 2018-06-14 | Disposition: A | Discharge status: Home / Self Care | Payer: Medicare Other | Attending: Emergency Medicine | Admitting: Emergency Medicine

## 2018-06-14 DIAGNOSIS — G8929 Other chronic pain: Secondary | ICD-10-CM | POA: Diagnosis not present

## 2018-06-14 DIAGNOSIS — I6523 Occlusion and stenosis of bilateral carotid arteries: Secondary | ICD-10-CM | POA: Diagnosis not present

## 2018-06-14 DIAGNOSIS — R350 Frequency of micturition: Secondary | ICD-10-CM | POA: Diagnosis not present

## 2018-06-14 DIAGNOSIS — M542 Cervicalgia: Secondary | ICD-10-CM | POA: Diagnosis not present

## 2018-06-14 DIAGNOSIS — R410 Disorientation, unspecified: Secondary | ICD-10-CM

## 2018-06-14 DIAGNOSIS — Z79899 Other long term (current) drug therapy: Secondary | ICD-10-CM | POA: Diagnosis not present

## 2018-06-14 DIAGNOSIS — E739 Lactose intolerance, unspecified: Secondary | ICD-10-CM | POA: Diagnosis not present

## 2018-06-14 DIAGNOSIS — R079 Chest pain, unspecified: Secondary | ICD-10-CM | POA: Diagnosis not present

## 2018-06-14 DIAGNOSIS — G20 Parkinson's disease: Secondary | ICD-10-CM | POA: Diagnosis not present

## 2018-06-14 DIAGNOSIS — H02402 Unspecified ptosis of left eyelid: Secondary | ICD-10-CM | POA: Insufficient documentation

## 2018-06-14 DIAGNOSIS — E782 Mixed hyperlipidemia: Secondary | ICD-10-CM | POA: Diagnosis not present

## 2018-06-14 DIAGNOSIS — I2510 Atherosclerotic heart disease of native coronary artery without angina pectoris: Secondary | ICD-10-CM | POA: Diagnosis not present

## 2018-06-14 DIAGNOSIS — G4700 Insomnia, unspecified: Secondary | ICD-10-CM | POA: Insufficient documentation

## 2018-06-14 DIAGNOSIS — I119 Hypertensive heart disease without heart failure: Secondary | ICD-10-CM | POA: Diagnosis not present

## 2018-06-14 DIAGNOSIS — N200 Calculus of kidney: Secondary | ICD-10-CM | POA: Diagnosis not present

## 2018-06-14 DIAGNOSIS — R51 Headache: Secondary | ICD-10-CM | POA: Insufficient documentation

## 2018-06-14 DIAGNOSIS — Z7982 Long term (current) use of aspirin: Secondary | ICD-10-CM | POA: Diagnosis not present

## 2018-06-14 DIAGNOSIS — Z1159 Encounter for screening for other viral diseases: Secondary | ICD-10-CM | POA: Diagnosis not present

## 2018-06-14 DIAGNOSIS — N400 Enlarged prostate without lower urinary tract symptoms: Secondary | ICD-10-CM | POA: Diagnosis not present

## 2018-06-14 DIAGNOSIS — E559 Vitamin D deficiency, unspecified: Secondary | ICD-10-CM | POA: Diagnosis not present

## 2018-06-14 DIAGNOSIS — I10 Essential (primary) hypertension: Secondary | ICD-10-CM | POA: Diagnosis not present

## 2018-06-14 DIAGNOSIS — Z9181 History of falling: Secondary | ICD-10-CM | POA: Diagnosis not present

## 2018-06-14 DIAGNOSIS — F3341 Major depressive disorder, recurrent, in partial remission: Secondary | ICD-10-CM | POA: Diagnosis not present

## 2018-06-14 DIAGNOSIS — J301 Allergic rhinitis due to pollen: Secondary | ICD-10-CM | POA: Diagnosis not present

## 2018-06-14 DIAGNOSIS — K219 Gastro-esophageal reflux disease without esophagitis: Secondary | ICD-10-CM | POA: Diagnosis not present

## 2018-06-14 DIAGNOSIS — R531 Weakness: Secondary | ICD-10-CM

## 2018-06-14 DIAGNOSIS — R519 Headache, unspecified: Secondary | ICD-10-CM

## 2018-06-14 DIAGNOSIS — F411 Generalized anxiety disorder: Secondary | ICD-10-CM | POA: Diagnosis not present

## 2018-06-14 DIAGNOSIS — Z951 Presence of aortocoronary bypass graft: Secondary | ICD-10-CM | POA: Diagnosis not present

## 2018-06-14 DIAGNOSIS — R0602 Shortness of breath: Secondary | ICD-10-CM | POA: Diagnosis not present

## 2018-06-14 DIAGNOSIS — I252 Old myocardial infarction: Secondary | ICD-10-CM | POA: Diagnosis not present

## 2018-06-14 DIAGNOSIS — G4733 Obstructive sleep apnea (adult) (pediatric): Secondary | ICD-10-CM | POA: Diagnosis not present

## 2018-06-14 DIAGNOSIS — R52 Pain, unspecified: Secondary | ICD-10-CM | POA: Diagnosis not present

## 2018-06-14 LAB — URINALYSIS, ROUTINE W REFLEX MICROSCOPIC
Bilirubin Urine: NEGATIVE
Glucose, UA: NEGATIVE mg/dL
Hgb urine dipstick: NEGATIVE
Ketones, ur: NEGATIVE mg/dL
Leukocytes,Ua: NEGATIVE
Nitrite: NEGATIVE
Protein, ur: NEGATIVE mg/dL
Specific Gravity, Urine: 1.013 (ref 1.005–1.030)
pH: 6 (ref 5.0–8.0)

## 2018-06-14 LAB — CBC WITH DIFFERENTIAL/PLATELET
Abs Immature Granulocytes: 0.01 10*3/uL (ref 0.00–0.07)
Basophils Absolute: 0 10*3/uL (ref 0.0–0.1)
Basophils Relative: 0 %
Eosinophils Absolute: 0.1 10*3/uL (ref 0.0–0.5)
Eosinophils Relative: 1 %
HCT: 40.7 % (ref 39.0–52.0)
Hemoglobin: 13 g/dL (ref 13.0–17.0)
Immature Granulocytes: 0 %
Lymphocytes Relative: 15 %
Lymphs Abs: 0.8 10*3/uL (ref 0.7–4.0)
MCH: 31.1 pg (ref 26.0–34.0)
MCHC: 31.9 g/dL (ref 30.0–36.0)
MCV: 97.4 fL (ref 80.0–100.0)
Monocytes Absolute: 0.4 10*3/uL (ref 0.1–1.0)
Monocytes Relative: 8 %
Neutro Abs: 4.3 10*3/uL (ref 1.7–7.7)
Neutrophils Relative %: 76 %
Platelets: 214 10*3/uL (ref 150–400)
RBC: 4.18 MIL/uL — ABNORMAL LOW (ref 4.22–5.81)
RDW: 12.6 % (ref 11.5–15.5)
WBC: 5.6 10*3/uL (ref 4.0–10.5)
nRBC: 0 % (ref 0.0–0.2)

## 2018-06-14 LAB — BASIC METABOLIC PANEL
Anion gap: 7 (ref 5–15)
BUN: 29 mg/dL — ABNORMAL HIGH (ref 8–23)
CO2: 28 mmol/L (ref 22–32)
Calcium: 9 mg/dL (ref 8.9–10.3)
Chloride: 104 mmol/L (ref 98–111)
Creatinine, Ser: 1.04 mg/dL (ref 0.61–1.24)
GFR calc Af Amer: >60 mL/min (ref >60)
GFR calc non Af Amer: >60 mL/min (ref >60)
Glucose, Bld: 99 mg/dL (ref 70–99)
Potassium: 4 mmol/L (ref 3.5–5.1)
Sodium: 139 mmol/L (ref 135–145)

## 2018-06-14 LAB — TSH: TSH: 0.456 u[IU]/mL (ref 0.350–4.500)

## 2018-06-14 LAB — SARS CORONAVIRUS 2 BY RT PCR (HOSPITAL ORDER, PERFORMED IN ~~LOC~~ HOSPITAL LAB): SARS Coronavirus 2: NEGATIVE

## 2018-06-14 LAB — LACTIC ACID, PLASMA: Lactic Acid, Venous: 2.1 mmol/L (ref 0.5–1.9)

## 2018-06-14 MED ORDER — SODIUM CHLORIDE 0.9 % IV BOLUS
500.0000 mL | Freq: Once | INTRAVENOUS | Status: AC
  Administered 2018-06-14: 16:00:00 500 mL via INTRAVENOUS

## 2018-06-14 MED ORDER — IOHEXOL 300 MG/ML  SOLN
100.0000 mL | Freq: Once | INTRAMUSCULAR | Status: AC | PRN
  Administered 2018-06-14: 100 mL via INTRAVENOUS

## 2018-06-14 NOTE — Telephone Encounter (Signed)
I called Mrs. Shakespeare and gave her instructions per Dr. Carles Collet.  She said that home health aide is there now getting patient our of the shower.  He had a hard time walking and has a terrible headache in the base of his skull.  Instructed her to take him to the ER per Dr. Carles Collet.

## 2018-06-14 NOTE — ED Notes (Signed)
Patient transported to CT 

## 2018-06-14 NOTE — ED Triage Notes (Signed)
[facility] reports pt says head and neck ache x1 week. Wife and dtr say it began this morning. Pt is confused at baseline. Pt has had tylenol and advil for low grade 99.3 temp. 139/77 hr 74 rr 16 cbg 163

## 2018-06-14 NOTE — ED Provider Notes (Signed)
[facility] DEPART[facility] Provider Note   CSN: [id number] Arrival date & time: 06/14/18  1324    History   Chief Complaint Chief Complaint  Patient presents with   Headache    HPI Paul Ortiz is a 73 y.o. male with history of Parkinson's disease, hypertension, HLD presents for evaluation of progressively worsening generalized weakness, confusion, intermittent headaches for 5 days.  Patient reports symptoms began on Friday.  He reports severe occipital headaches that occur primarily with position changes and while showering.  Wife also notes that he has been generally weak and has required extra assistance from home health.  He has also been more confused than usual per the wife.  Has had a low-grade temperature up to 99.3 F at home.  He denies any headache at this time.  He denies any new vision changes, chest pain, shortness of breath, cough, abdominal pain, nausea, vomiting, diarrhea, or constipation.  Wife does note increased urinary frequency over the last 2 weeks.  Recent head injury or loss of consciousness.  Sent by his neurologist for further evaluation. .   The history is provided by the patient and the spouse.    Past Medical History:  Diagnosis Date   Abdominal wall hernia    BPH with obstruction/lower urinary tract symptoms    Central sleep apnea    Coronary atherosclerosis of native coronary artery    Hypercholesteremia    Hypertension    Insomnia    Major depression    MI (myocardial infarction) ([place]) 2005   Mixed hyperlipidemia    OSA (obstructive sleep apnea)    Parkinson disease ([place])    Vasomotor rhinitis     Patient Active Problem List   Diagnosis Date Noted   Essential hypertension 07/07/2016   Hyperlipidemia 07/07/2016   Anxiety 07/06/2016   OSA (obstructive sleep apnea) 07/06/2016   Panic attacks 07/06/2016   PD (Parkinson's disease) ([place]) 07/06/2016   RLS (restless legs syndrome) 07/06/2016    Chronic low back pain 06/17/2016   Hemangioma of spine 06/17/2016   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 Authorizing Provider  acetaminophen (TYLENOL) 500 MG tablet Take 1,000 mg by mouth every 6 (six) hours as needed. Alternating with 2 (ibuprofen 200mg  ) every 3 hours at meal times and Bedtime.   Yes [provider]  ALPRAZolam Duanne Moron) 1 MG tablet Take 1 mg by mouth at bedtime.   Yes [provider]  aspirin 325 MG tablet Take 325 mg by mouth daily.   Yes [provider]  carbidopa-levodopa (SINEMET CR) 50-200 MG tablet Take 1 tablet by mouth at bedtime.   Yes [provider]  carbidopa-levodopa (SINEMET IR) 25-250 MG tablet Take 1 tablet by mouth 3 (three) times daily.   Yes [provider]  Cholecalciferol (VITAMIN D-1000 MAX ST) 1000 units tablet Take 1 tablet by mouth  daily.   Yes [provider]  furosemide (LASIX) 20 MG tablet Take 10 mg by mouth daily. Take half tablet (10mg ) once daily   Yes [provider]  gabapentin (NEURONTIN) 100 MG capsule Take 200 mg by mouth at bedtime.    Yes [provider]  ibuprofen (ADVIL) 200 MG tablet Take 400 mg by mouth every 6 (six) hours as needed for moderate pain (rotates with tylenol every 3 hours at meal times and bedtime). Take 2 tab daily    Yes [provider]  ipratropium (ATROVENT) 0.06 % nasal  spray Place 2 sprays into the nose as needed. 07/22/14  Yes [provider]  Magnesium 500 MG CAPS Take 250 mg by mouth daily. Taking at noon   Yes [provider]  metoprolol succinate (TOPROL-XL) 25 MG 24 hr tablet Take 12.5 mg by mouth 2 (two) times daily. Half in am and half at night 02/16/16  Yes [provider]  Omega-3 Fatty Acids (FISH OIL) 1000 MG CAPS Take 2,000 mg by mouth daily.    Yes [provider]  ondansetron (ZOFRAN) 8 MG tablet Take 8 mg by mouth every 8 (eight) hours as needed for nausea.    Yes [provider]  potassium chloride (K-DUR) 10 MEQ tablet Take 5 mEq by mouth daily.    Yes [provider]  Propylene Glycol-Glycerin (SOOTHE) 0.6-0.6 % SOLN Place 1 drop into both eyes daily as needed (dry eyes).   Yes [provider]  Rotigotine 3 MG/24HR PT24 Place 1 patch onto the skin daily.   Yes [provider]  tamsulosin (FLOMAX) 0.4 MG CAPS capsule Take 0.4 mg by mouth daily. 03/03/16  Yes [provider]  vitamin B-12 (CYANOCOBALAMIN) 1000 MCG tablet Take 1,000 mcg by mouth daily.   Yes [provider]  QUEtiapine (SEROQUEL) 25 MG tablet Take 0.5 tablets (12.5 mg total) by mouth at bedtime. Patient not taking: Reported on 06/09/2018 02/10/18   Isla Pence, MD    Family History Family History  Problem Relation Age of Onset   Heart attack Father 73   Cancer Sister        non hodgkins lymphoma; scleroderma   Diabetes Daughter    Fibromyalgia Daughter     Social History Social History   Tobacco Use   Smoking status: Never Smoker   Smokeless tobacco: Never Used  Substance Use Topics   Alcohol use: No   Drug use: No     Allergies   Lactose intolerance (gi) and Penicillins   Review of Systems Review of Systems  Constitutional: Positive for fatigue. Negative for chills and fever.  Respiratory: Negative for cough and shortness of breath.   Cardiovascular: Negative  for chest pain.  Gastrointestinal: Negative for abdominal pain, nausea and vomiting.  Genitourinary: Positive for frequency.  Neurological: Positive for weakness (generalized) and headaches. Negative for syncope.  All other systems reviewed and are negative.    Physical Exam Updated Vital Signs BP (!) 164/94 (BP Location: Right Arm)    Pulse 82    Temp (!) 97.4 F (36.3 C) (Rectal)    Resp 19    Ht 6' (1.829 m)    Wt 79.4 kg    SpO2 100%    BMI 23.73 kg/m   Physical Exam Vitals signs and nursing note reviewed.  Constitutional:      General: He is not in acute distress.    Appearance: He is well-developed.     Comments: Alert but  appears tired  HENT:     Head: Normocephalic and atraumatic.  Eyes:     General:        Right eye: No discharge.        Left eye: No discharge.     Extraocular Movements: Extraocular movements intact.     Conjunctiva/sclera: Conjunctivae normal.     Pupils: Pupils are equal, round, and reactive to light.  Neck:     Musculoskeletal: Normal range of motion and neck supple.     Vascular: No JVD.     Trachea: No tracheal deviation.  Cardiovascular:     Rate and Rhythm: Normal rate and regular rhythm.     Heart sounds: Normal heart sounds.     Comments: 2+ radial and DP/PT pulses bilaterally Pulmonary:     Effort: Pulmonary effort is normal.     Breath sounds: Normal breath sounds.  Abdominal:     General: Bowel sounds are normal. There is no distension.     Palpations: Abdomen is soft.     Tenderness: There is no abdominal tenderness. There is no guarding.  Skin:    General: Skin is warm and dry.     Findings: No erythema.  Neurological:     Mental Status: He is alert.     GCS: GCS eye subscore is 4. GCS verbal subscore is 5. GCS motor subscore is 6.     Motor: Weakness present.     Comments: Mild left eyelid ptosis, which patient and wife report is baseline.  Cranial nerves II through XII tested and otherwise intact.  Sensation intact to soft  touch of face and extremities.  Moves extremities slowly weaker proximally compared to distally.  Strength equal bilaterally.  Psychiatric:        Behavior: Behavior normal.      ED Treatments / Results  Labs (all labs ordered are listed, but only abnormal results are displayed) Labs Reviewed  CBC WITH DIFFERENTIAL/PLATELET - Abnormal; Notable for the following components:      Result Value   RBC 4.18 (*)    All other components within normal limits  BASIC METABOLIC PANEL - Abnormal; Notable for the following components:   BUN 29 (*)    All other components within normal limits  LACTIC ACID, PLASMA - Abnormal; Notable for the following components:   Lactic Acid, Venous 2.1 (*)    All other components within normal limits  SARS CORONAVIRUS 2 (HOSPITAL ORDER, [place] [facility])  URINE CULTURE  URINALYSIS, ROUTINE W REFLEX MICROSCOPIC  TSH    EKG None  Radiology Ct Angio Head W Or Wo Contrast  Result Date: 06/14/2018 CLINICAL DATA:  Headache and neck pain. EXAM: CT ANGIOGRAPHY HEAD AND NECK TECHNIQUE: Multidetector CT imaging of the head and neck was performed using the standard protocol during bolus administration of intravenous contrast. Multiplanar CT image reconstructions and MIPs were obtained to evaluate the vascular anatomy. Carotid stenosis measurements (when applicable) are obtained utilizing NASCET criteria, using the distal internal carotid diameter as the denominator. CONTRAST:  165mL OMNIPAQUE IOHEXOL 300 MG/ML  SOLN COMPARISON:  Head CT 02/10/2018 FINDINGS: CT HEAD FINDINGS Brain: There is no evidence of acute infarct, intracranial hemorrhage, mass, midline shift, or extra-axial fluid collection. Patchy cerebral white matter hypodensities are unchanged and nonspecific but compatible with moderate chronic small vessel ischemic disease. Chronic thalamic and basal ganglia lacunar infarcts are unchanged. There is mild cerebral atrophy. Vascular: Calcified  atherosclerosis at the skull base. No hyperdense vessel.  Skull: No fracture or focal osseous lesion. Sinuses: Paranasal sinuses and mastoid air cells are clear. Orbits: Bilateral cataract extraction. Review of the MIP images confirms the above findings CTA NECK FINDINGS Aortic arch: Incomplete imaging of the aortic arch with exclusion of the brachiocephalic and left common carotid artery origins. Mild atherosclerotic plaque in the included portion of the arch. Wide patency of the included portions of the brachiocephalic and subclavian arteries. Right carotid system: Patent with minimal plaque at the carotid bifurcation. No evidence of dissection or stenosis. Left carotid system: Patent with mild plaque at the carotid bifurcation. No evidence of dissection or significant stenosis within limitations of motion artifact through the distal common and proximal external carotid arteries. Vertebral arteries: Patent and codominant without evidence of significant stenosis or dissection. Skeleton: Hemangioma in the C7 vertebral body. Other neck: Limited assessment of the pharynx and larynx due to motion. No evidence of lymphadenopathy. Upper chest: 10 mm spiculated nodule in the left lung apex Review of the MIP images confirms the above findings CTA HEAD FINDINGS Anterior circulation: The internal carotid arteries are patent from skull base to carotid termini with nonstenotic calcified plaque bilaterally. ACAs and MCAs are patent without evidence of proximal branch occlusion or significant proximal stenosis. There is a severe distal left A2 stenosis. No aneurysm is identified. Posterior circulation: The intracranial vertebral arteries are patent to the basilar with mild nonstenotic atherosclerotic plaque bilaterally. Patent left PICA, right larger than left AICA is, and bilateral SCA is are identified. The basilar artery is widely patent. Posterior communicating arteries are small or absent. PCAs are patent with branch vessel  irregularity but no significant proximal stenosis. No aneurysm is identified. Venous sinuses: Patent. Anatomic variants: None. Delayed phase: No abnormal enhancement. Review of the MIP images confirms the above findings CLINICAL DATA:  Headache and neck pain. EXAM: CT ANGIOGRAPHY HEAD AND NECK TECHNIQUE: Multidetector CT imaging of the head and neck was performed using the standard protocol during bolus administration of intravenous contrast. Multiplanar CT image reconstructions and MIPs were obtained to evaluate the vascular anatomy. Carotid stenosis measurements (when applicable) are obtained utilizing NASCET criteria, using the distal internal carotid diameter as the denominator. CONTRAST:  159mL OMNIPAQUE IOHEXOL 300 MG/ML  SOLN COMPARISON:  Head CT 02/10/2018 FINDINGS: CT HEAD FINDINGS Brain: There is no evidence of acute infarct, intracranial hemorrhage, mass, midline shift, or extra-axial fluid collection. Patchy cerebral white matter hypodensities are unchanged and nonspecific but compatible with moderate chronic small vessel ischemic disease. Chronic thalamic and basal ganglia lacunar infarcts are unchanged. There is mild cerebral atrophy. Vascular: Calcified atherosclerosis at the skull base. No hyperdense vessel. Skull: No fracture or focal osseous lesion. Sinuses: Paranasal sinuses and mastoid air cells are clear. Orbits: Bilateral cataract extraction. Review of the MIP images confirms the above findings CTA NECK FINDINGS Aortic arch: Incomplete imaging of the aortic arch with exclusion of the brachiocephalic and left common carotid artery origins. Mild atherosclerotic plaque in the included portion of the arch. Wide patency of the included portions of the brachiocephalic and subclavian arteries. Right carotid system: Patent with minimal plaque at the carotid bifurcation. No evidence of dissection or stenosis. Left carotid system: Patent with mild plaque at the carotid bifurcation. No evidence of  dissection or significant stenosis within limitations of motion artifact through the distal common and proximal external carotid arteries. Vertebral arteries: Patent and codominant without evidence of significant stenosis or dissection. Skeleton: Hemangioma in the C7 vertebral body. Other neck: Limited assessment of the pharynx  and larynx due to motion. No evidence of lymphadenopathy. Upper chest: 10 mm spiculated nodule in the left lung apex Review of the MIP images confirms the above findings CTA HEAD FINDINGS Anterior circulation: The internal carotid arteries are patent from skull base to carotid termini with nonstenotic calcified plaque bilaterally. ACAs and MCAs are patent without evidence of proximal branch occlusion or significant proximal stenosis. There is a severe distal left A2 stenosis. No aneurysm is identified. Posterior circulation: The intracranial vertebral arteries are patent to the basilar with mild nonstenotic atherosclerotic plaque bilaterally. Patent left PICA, right larger than left AICAs, and bilateral SCAs are identified. The basilar artery is widely patent. Posterior communicating arteries are small or absent. PCAs are patent with branch vessel irregularity but no significant proximal stenosis. No aneurysm is identified. Venous sinuses: Patent. Anatomic variants: None. Delayed phase: No abnormal enhancement. Review of the MIP images confirms the above findings IMPRESSION: 1. No evidence of acute intracranial abnormality. 2. Moderate chronic small vessel ischemic disease. 3. Intracranial atherosclerosis with a severe distal left A2 stenosis. No proximal branch occlusion or aneurysm. 4. Mild cervical carotid artery atherosclerosis without stenosis. 5. 10 mm left apical lung nodule which may reflect a small neoplasm or asymmetric scarring. Consider one of the following in 3 months for both low-risk and high-risk individuals: (a) repeat chest CT, (b) follow-up PET-CT, or (c) tissue sampling.  This recommendation follows the consensus statement: Guidelines for Management of Incidental Pulmonary Nodules Detected on CT Images: From the Fleischner Society 2017; Radiology 2017; 284:228-243. 6.  Aortic Atherosclerosis (ICD10-I70.0). Electronically Signed   By: Logan Bores M.D.   On: 06/14/2018 17:59   Ct Angio Neck W And/or Wo Contrast  Result Date: 06/14/2018 CLINICAL DATA:  Headache and neck pain. EXAM: CT ANGIOGRAPHY HEAD AND NECK TECHNIQUE: Multidetector CT imaging of the head and neck was performed using the standard protocol during bolus administration of intravenous contrast. Multiplanar CT image reconstructions and MIPs were obtained to evaluate the vascular anatomy. Carotid stenosis measurements (when applicable) are obtained utilizing NASCET criteria, using the distal internal carotid diameter as the denominator. CONTRAST:  170mL OMNIPAQUE IOHEXOL 300 MG/ML  SOLN COMPARISON:  Head CT 02/10/2018 FINDINGS: CT HEAD FINDINGS Brain: There is no evidence of acute infarct, intracranial hemorrhage, mass, midline shift, or extra-axial fluid collection. Patchy cerebral white matter hypodensities are unchanged and nonspecific but compatible with moderate chronic small vessel ischemic disease. Chronic thalamic and basal ganglia lacunar infarcts are unchanged. There is mild cerebral atrophy. Vascular: Calcified atherosclerosis at the skull base. No hyperdense vessel. Skull: No fracture or focal osseous lesion. Sinuses: Paranasal sinuses and mastoid air cells are clear. Orbits: Bilateral cataract extraction. Review of the MIP images confirms the above findings CTA NECK FINDINGS Aortic arch: Incomplete imaging of the aortic arch with exclusion of the brachiocephalic and left common carotid artery origins. Mild atherosclerotic plaque in the included portion of the arch. Wide patency of the included portions of the brachiocephalic and subclavian arteries. Right carotid system: Patent with minimal plaque at the  carotid bifurcation. No evidence of dissection or stenosis. Left carotid system: Patent with mild plaque at the carotid bifurcation. No evidence of dissection or significant stenosis within limitations of motion artifact through the distal common and proximal external carotid arteries. Vertebral arteries: Patent and codominant without evidence of significant stenosis or dissection. Skeleton: Hemangioma in the C7 vertebral body. Other neck: Limited assessment of the pharynx and larynx due to motion. No evidence of lymphadenopathy. Upper chest: 10 mm  spiculated nodule in the left lung apex Review of the MIP images confirms the above findings CTA HEAD FINDINGS Anterior circulation: The internal carotid arteries are patent from skull base to carotid termini with nonstenotic calcified plaque bilaterally. ACAs and MCAs are patent without evidence of proximal branch occlusion or significant proximal stenosis. There is a severe distal left A2 stenosis. No aneurysm is identified. Posterior circulation: The intracranial vertebral arteries are patent to the basilar with mild nonstenotic atherosclerotic plaque bilaterally. Patent left PICA, right larger than left AICA is, and bilateral SCA is are identified. The basilar artery is widely patent. Posterior communicating arteries are small or absent. PCAs are patent with branch vessel irregularity but no significant proximal stenosis. No aneurysm is identified. Venous sinuses: Patent. Anatomic variants: None. Delayed phase: No abnormal enhancement. Review of the MIP images confirms the above findings CLINICAL DATA:  Headache and neck pain. EXAM: CT ANGIOGRAPHY HEAD AND NECK TECHNIQUE: Multidetector CT imaging of the head and neck was performed using the standard protocol during bolus administration of intravenous contrast. Multiplanar CT image reconstructions and MIPs were obtained to evaluate the vascular anatomy. Carotid stenosis measurements (when applicable) are obtained  utilizing NASCET criteria, using the distal internal carotid diameter as the denominator. CONTRAST:  189mL OMNIPAQUE IOHEXOL 300 MG/ML  SOLN COMPARISON:  Head CT 02/10/2018 FINDINGS: CT HEAD FINDINGS Brain: There is no evidence of acute infarct, intracranial hemorrhage, mass, midline shift, or extra-axial fluid collection. Patchy cerebral white matter hypodensities are unchanged and nonspecific but compatible with moderate chronic small vessel ischemic disease. Chronic thalamic and basal ganglia lacunar infarcts are unchanged. There is mild cerebral atrophy. Vascular: Calcified atherosclerosis at the skull base. No hyperdense vessel. Skull: No fracture or focal osseous lesion. Sinuses: Paranasal sinuses and mastoid air cells are clear. Orbits: Bilateral cataract extraction. Review of the MIP images confirms the above findings CTA NECK FINDINGS Aortic arch: Incomplete imaging of the aortic arch with exclusion of the brachiocephalic and left common carotid artery origins. Mild atherosclerotic plaque in the included portion of the arch. Wide patency of the included portions of the brachiocephalic and subclavian arteries. Right carotid system: Patent with minimal plaque at the carotid bifurcation. No evidence of dissection or stenosis. Left carotid system: Patent with mild plaque at the carotid bifurcation. No evidence of dissection or significant stenosis within limitations of motion artifact through the distal common and proximal external carotid arteries. Vertebral arteries: Patent and codominant without evidence of significant stenosis or dissection. Skeleton: Hemangioma in the C7 vertebral body. Other neck: Limited assessment of the pharynx and larynx due to motion. No evidence of lymphadenopathy. Upper chest: 10 mm spiculated nodule in the left lung apex Review of the MIP images confirms the above findings CTA HEAD FINDINGS Anterior circulation: The internal carotid arteries are patent from skull base to carotid  termini with nonstenotic calcified plaque bilaterally. ACAs and MCAs are patent without evidence of proximal branch occlusion or significant proximal stenosis. There is a severe distal left A2 stenosis. No aneurysm is identified. Posterior circulation: The intracranial vertebral arteries are patent to the basilar with mild nonstenotic atherosclerotic plaque bilaterally. Patent left PICA, right larger than left AICAs, and bilateral SCAs are identified. The basilar artery is widely patent. Posterior communicating arteries are small or absent. PCAs are patent with branch vessel irregularity but no significant proximal stenosis. No aneurysm is identified. Venous sinuses: Patent. Anatomic variants: None. Delayed phase: No abnormal enhancement. Review of the MIP images confirms the above findings IMPRESSION: 1. No evidence  of acute intracranial abnormality. 2. Moderate chronic small vessel ischemic disease. 3. Intracranial atherosclerosis with a severe distal left A2 stenosis. No proximal branch occlusion or aneurysm. 4. Mild cervical carotid artery atherosclerosis without stenosis. 5. 10 mm left apical lung nodule which may reflect a small neoplasm or asymmetric scarring. Consider one of the following in 3 months for both low-risk and high-risk individuals: (a) repeat chest CT, (b) follow-up PET-CT, or (c) tissue sampling. This recommendation follows the consensus statement: Guidelines for Management of Incidental Pulmonary Nodules Detected on CT Images: From the Fleischner Society 2017; Radiology 2017; 284:228-243. 6.  Aortic Atherosclerosis (ICD10-I70.0). Electronically Signed   By: Logan Bores M.D.   On: 06/14/2018 17:59   Dg Chest Portable 1 View  Result Date: 06/14/2018 CLINICAL DATA:  Chest pain and shortness of breath. EXAM: PORTABLE CHEST 1 VIEW COMPARISON:  02/10/2018 FINDINGS: Previous median sternotomy and CABG. Heart size upper limits of normal. Chronic aortic atherosclerosis. The pulmonary vascularity  is normal. Lungs are clear. No infiltrate, collapse or effusion. No significant bone finding. IMPRESSION: Previous CABG. Aortic atherosclerosis. No active cardiopulmonary disease. Electronically Signed   By: Nelson Chimes M.D.   On: 06/14/2018 15:51    Procedures Procedures (including critical care time)  Medications Ordered in ED Medications  sodium chloride 0.9 % bolus 500 mL (0 mLs Intravenous Stopped 06/14/18 1733)  iohexol (OMNIPAQUE) 300 MG/ML solution 100 mL (100 mLs Intravenous Contrast Given 06/14/18 1729)     Initial Impression / Assessment and Plan / ED Course  I have reviewed the triage vital signs and the nursing notes.  Pertinent labs & imaging results that were available during my care of the patient were reviewed by me and considered in my medical decision making (see chart for details).        Patient presenting for evaluation of intermittent occipital headaches, generalized weakness, some confusion.  History is provided by the patient and his wife.  Sent over at the recommendation of his neurologist for evaluation of his headache which she does not feel is related to his Parkinson's.  He is afebrile, vital signs are stable.  He is nontoxic in appearance.  Parkinsonian features noted on examination.  He does not currently have a headache.  Will obtain lab work and imaging and reassess.  He declines any medication for headache at this time.  Differential diagnoses include worsening Parkinson's, CVA, brain tumor mass.  Low suspicion of intracranial hemorrhage, SAH, meningitis.  Lab work reviewed by me shows no leukocytosis, no anemia, no metabolic derangements.  Very mild elevation in lactic acid. I do not think feel that he is septic at this time in the absence of fever, tachycardia, or infectious source.  His chest x-ray shows no acute cardiopulmonary abnormalities and his UA is not concerning for UTI or nephrolithiasis.  3:55PM Signed out to oncoming provider Dr. Maryan Rued.   Pending imaging and COVID testing.  If both are negative, he is likely stable for discharge home with close follow-up with his neurologist on an outpatient basis.  Discussed with Dr. Alvino Chapel who agrees with assessment and plan at this time.  Final Clinical Impressions(s) / ED Diagnoses   Final diagnoses:  Recurrent occipital headache  Generalized weakness  Confusion  Insomnia, unspecified type    ED Discharge Orders    None       Debroah Baller 06/15/18 0804    Davonna Belling, MD 06/15/18 1536

## 2018-06-14 NOTE — ED Provider Notes (Signed)
Assumed care at 1530.  Briefly patient is a 73 year old male with a history of Parkinson's disease for the last 2 weeks or so has had worsening confusion and more difficulty getting around his home per his wife who is at bedside.  They recently started seeing a new neurologist Dr. Carles Collet and they have had phone visits but have not seen her in the office yet.  Because of his worsening mental status and ability issues she recommended he come here for further care.  In addition to these things he is also started having decubital headaches which seem to be worse when he gets up in the morning and attempts to shower to the point where he has to lay down.  He states he has never had headaches quite like this.  He says usually he will get them in the morning but by noon they have gone away.  Said no visual changes.  Wife denies any localizing symptoms of weakness but seems to be more general.  He is still eating and drinking but states he never drinks enough.  He has not had fever, cough, nausea vomiting or abdominal pain.  He has not had any significant medication changes.  She states 3 weeks ago he took 1 dose of Seroquel because he does not sleep well at night but he did not have a good reaction to it and they did not take anymore.  They also attempted to cut 1 of his medications in half that he takes for leg pain but he had a terrible time when they did that so went back to his normal dosage.  On exam patient is awake and alert.  He is able answer questions but appears generally fatigued.  Patient's coronavirus testing is negative, UA within normal limits, CBC, BMP, and chest x-ray without acute findings.  Lactate is mildly elevated at 2.1 and patient was given IV fluids.  CTA of the head and neck to rule out any type of obstructive pathology, tumors or increased intercranial pressure is pending.  7:31 PM TSH levels are normal.  A[place]a testing is negative.  Head CT without acute findings or cause for the headache.   Neurology suggested it may be related to Parkinson's disease causing neck rigidity and reflexive headaches especially because they improve by midday and recommended a muscle relaxer may be helpful.  Discussed this with the patient's wife and recommended she speak with Dr. Carles Collet tomorrow to see which medication they would recommend.   Blanchie Dessert, MD 06/14/18 1932

## 2018-06-14 NOTE — ED Notes (Signed)
Patient verbalizes understanding of discharge instructions. Opportunity for questioning and answers were provided. Armband removed by staff, pt discharged from ED.  

## 2018-06-15 ENCOUNTER — Telehealth: Payer: Self-pay | Admitting: *Deleted

## 2018-06-15 ENCOUNTER — Telehealth: Payer: Self-pay | Admitting: Neurology

## 2018-06-15 LAB — URINE CULTURE: Culture: <10000 — AB

## 2018-06-15 NOTE — Telephone Encounter (Signed)
Patient's wife given information per Dr. Carles Collet.  She said that she put a warm gel pack on his neck this morning and he did not get a headache.  She will continue to do this for now.

## 2018-06-15 NOTE — Telephone Encounter (Signed)
Patient's wife has questions about the neupro patch.  Are you still cutting back on it.  She also wanted me to tell you thank you!

## 2018-06-15 NOTE — Telephone Encounter (Signed)
Wife left VM about patient being taken to the ER yesterday. All the test came back good. She said she would like for Dr. Carles Collet to review his meds and see if there is anything that can help him sleep and help with muscle relaxer. His muscles are tight in his neck and causes a severe headache. She didn't specifically say why he went to the hospital but I think it was because of the tightness/headache. Please call her back at [phone]. Thanks!

## 2018-06-15 NOTE — Telephone Encounter (Signed)
I did look at hospital records.  I will have to disagree with them in that PD doesn't cause headache.  I also don't treat headache.  I think that a muscle relaxer is going to contribute to confusion and wouldn't recommend that.  Happy to refer them to the [place] [facility] (Dr. Domingo Cocking) and see if they can help with something like injections.

## 2018-06-15 NOTE — Telephone Encounter (Signed)
Please advise 

## 2018-06-15 NOTE — Telephone Encounter (Signed)
I would like to but lets wait until he gets situated and feeling a bit better.

## 2018-06-15 NOTE — Telephone Encounter (Signed)
Called Mrs. Birchard and let her know that Dr. Carles Collet would like to wait.  She will call us when her husband is feeling better.

## 2018-06-16 DIAGNOSIS — F3341 Major depressive disorder, recurrent, in partial remission: Secondary | ICD-10-CM | POA: Diagnosis not present

## 2018-06-16 DIAGNOSIS — I2510 Atherosclerotic heart disease of native coronary artery without angina pectoris: Secondary | ICD-10-CM | POA: Diagnosis not present

## 2018-06-16 DIAGNOSIS — E782 Mixed hyperlipidemia: Secondary | ICD-10-CM | POA: Diagnosis not present

## 2018-06-16 DIAGNOSIS — G20 Parkinson's disease: Secondary | ICD-10-CM | POA: Diagnosis not present

## 2018-06-16 DIAGNOSIS — I119 Hypertensive heart disease without heart failure: Secondary | ICD-10-CM | POA: Diagnosis not present

## 2018-06-16 DIAGNOSIS — F411 Generalized anxiety disorder: Secondary | ICD-10-CM | POA: Diagnosis not present

## 2018-06-20 DIAGNOSIS — F3341 Major depressive disorder, recurrent, in partial remission: Secondary | ICD-10-CM | POA: Diagnosis not present

## 2018-06-20 DIAGNOSIS — F411 Generalized anxiety disorder: Secondary | ICD-10-CM | POA: Diagnosis not present

## 2018-06-20 DIAGNOSIS — E782 Mixed hyperlipidemia: Secondary | ICD-10-CM | POA: Diagnosis not present

## 2018-06-20 DIAGNOSIS — I2510 Atherosclerotic heart disease of native coronary artery without angina pectoris: Secondary | ICD-10-CM | POA: Diagnosis not present

## 2018-06-20 DIAGNOSIS — I119 Hypertensive heart disease without heart failure: Secondary | ICD-10-CM | POA: Diagnosis not present

## 2018-06-20 DIAGNOSIS — G20 Parkinson's disease: Secondary | ICD-10-CM | POA: Diagnosis not present

## 2018-06-22 DIAGNOSIS — F411 Generalized anxiety disorder: Secondary | ICD-10-CM | POA: Diagnosis not present

## 2018-06-22 DIAGNOSIS — G20 Parkinson's disease: Secondary | ICD-10-CM | POA: Diagnosis not present

## 2018-06-22 DIAGNOSIS — I119 Hypertensive heart disease without heart failure: Secondary | ICD-10-CM | POA: Diagnosis not present

## 2018-06-22 DIAGNOSIS — E782 Mixed hyperlipidemia: Secondary | ICD-10-CM | POA: Diagnosis not present

## 2018-06-22 DIAGNOSIS — I2510 Atherosclerotic heart disease of native coronary artery without angina pectoris: Secondary | ICD-10-CM | POA: Diagnosis not present

## 2018-06-22 DIAGNOSIS — F3341 Major depressive disorder, recurrent, in partial remission: Secondary | ICD-10-CM | POA: Diagnosis not present

## 2018-06-23 ENCOUNTER — Telehealth: Payer: Self-pay | Admitting: Licensed Clinical Social Worker

## 2018-06-23 NOTE — Telephone Encounter (Signed)
Palliative Care SW spoke with patient's wife, [name], and scheduled a Telehealth visit for Tuesday, 5/26, at 2pm.  Provided education regarding the Palliative Care Program.

## 2018-06-27 ENCOUNTER — Other Ambulatory Visit: Payer: Medicare Other | Admitting: Licensed Clinical Social Worker

## 2018-06-27 ENCOUNTER — Other Ambulatory Visit: Payer: Self-pay

## 2018-06-27 ENCOUNTER — Other Ambulatory Visit: Payer: Medicare Other | Admitting: *Deleted

## 2018-06-27 DIAGNOSIS — F3341 Major depressive disorder, recurrent, in partial remission: Secondary | ICD-10-CM | POA: Diagnosis not present

## 2018-06-27 DIAGNOSIS — I2510 Atherosclerotic heart disease of native coronary artery without angina pectoris: Secondary | ICD-10-CM | POA: Diagnosis not present

## 2018-06-27 DIAGNOSIS — Z515 Encounter for palliative care: Secondary | ICD-10-CM

## 2018-06-27 DIAGNOSIS — G20 Parkinson's disease: Secondary | ICD-10-CM | POA: Diagnosis not present

## 2018-06-27 DIAGNOSIS — F411 Generalized anxiety disorder: Secondary | ICD-10-CM | POA: Diagnosis not present

## 2018-06-27 DIAGNOSIS — E782 Mixed hyperlipidemia: Secondary | ICD-10-CM | POA: Diagnosis not present

## 2018-06-27 DIAGNOSIS — I119 Hypertensive heart disease without heart failure: Secondary | ICD-10-CM | POA: Diagnosis not present

## 2018-06-27 NOTE — Progress Notes (Signed)
COMMUNITY PALLIATIVE CARE SW NOTE  PATIENT NAME: HENCE DERRICK DOB: [birth year] MRN: [id number]  PRIMARY CARE PROVIDER: Shirline Frees, MD  RESPONSIBLE PARTY:  Acct ID - Guarantor Home Phone Work Phone Relationship Acct Type  [id number] SINAN, [id number] [phone] Self P/F     [place], [place], [place] [address]   Due to the COVID-19 crisis, this virtual check-in visit was done via telephone from my office and it was initiated and consent given by thispatientand or family.   PLAN OF CARE and INTERVENTIONS:             1. GOALS OF CARE/ ADVANCE CARE PLANNING:  Goal is to have patient move to [facility] SNF.  His wife, [name], wants patient to be comfortable and remain strong.   Patient has a DNR. 2. SOCIAL/EMOTIONAL/SPIRITUAL ASSESSMENT/ INTERVENTIONS:  SW and Palliative Care RN, Daryl Eastern, conducted a Telehealth visit with patient and his wife, [name].  Patient answered a few questions independently.  [facility]) was in the [place] and was exposed to [employer].  He is receiving VA benefits and is on the waiting list for [facility] SNF.  [name] has observed increased confusion and agitation.  MC has difficulty sleeping at night, which also keeps [place] up.  The couple has a daughter who lives locally and helps with patient care.  [name] assists patient with feeding.  One of the couples primary complaints is that he is always tired.  Patient currently receives physical therapy twice a week for four more weeks.  SW provided active listening and supportive counseling. 3. PATIENT/CAREGIVER EDUCATION/ COPING:  [name] expresses her feelings openly. 4. PERSONAL EMERGENCY PLAN:  [name] will contact patient's MD. 5. COMMUNITY RESOURCES COORDINATION/ HEALTH CARE NAVIGATION:  Patient has a CNA from the [place] Monday through Friday from 9a-12p. 6. FINANCIAL/LEGAL CONCERNS/INTERVENTIONS:  None.     SOCIAL HX:  Social History   Tobacco Use  . Smoking status: Never Smoker  . Smokeless  tobacco: Never Used  Substance Use Topics  . Alcohol use: No    CODE STATUS:  DNR  ADVANCED DIRECTIVES:  LW, HCPOA MOST FORM COMPLETE:  N HOSPICE EDUCATION PROVIDED: Y PPS: Patient's appetite is normal.  He uses a walker in the house, but a w/c outside of the house. Duration of visit and documentation:  60 minutes.      Creola Corn Floreine Kingdon, LCSW

## 2018-06-28 ENCOUNTER — Other Ambulatory Visit: Payer: Self-pay

## 2018-06-29 DIAGNOSIS — I2510 Atherosclerotic heart disease of native coronary artery without angina pectoris: Secondary | ICD-10-CM | POA: Diagnosis not present

## 2018-06-29 DIAGNOSIS — I10 Essential (primary) hypertension: Secondary | ICD-10-CM | POA: Diagnosis not present

## 2018-06-29 DIAGNOSIS — N401 Enlarged prostate with lower urinary tract symptoms: Secondary | ICD-10-CM | POA: Diagnosis not present

## 2018-06-29 DIAGNOSIS — E782 Mixed hyperlipidemia: Secondary | ICD-10-CM | POA: Diagnosis not present

## 2018-06-29 DIAGNOSIS — I119 Hypertensive heart disease without heart failure: Secondary | ICD-10-CM | POA: Diagnosis not present

## 2018-06-29 DIAGNOSIS — F329 Major depressive disorder, single episode, unspecified: Secondary | ICD-10-CM | POA: Diagnosis not present

## 2018-06-29 DIAGNOSIS — F411 Generalized anxiety disorder: Secondary | ICD-10-CM | POA: Diagnosis not present

## 2018-06-29 DIAGNOSIS — F3341 Major depressive disorder, recurrent, in partial remission: Secondary | ICD-10-CM | POA: Diagnosis not present

## 2018-06-29 DIAGNOSIS — G20 Parkinson's disease: Secondary | ICD-10-CM | POA: Diagnosis not present

## 2018-06-30 ENCOUNTER — Other Ambulatory Visit: Payer: Self-pay

## 2018-06-30 ENCOUNTER — Other Ambulatory Visit: Payer: Medicare Other | Admitting: Licensed Clinical Social Worker

## 2018-06-30 DIAGNOSIS — Z515 Encounter for palliative care: Secondary | ICD-10-CM

## 2018-07-03 ENCOUNTER — Other Ambulatory Visit: Payer: Medicare Other | Admitting: Licensed Clinical Social Worker

## 2018-07-03 DIAGNOSIS — Z515 Encounter for palliative care: Secondary | ICD-10-CM

## 2018-07-03 NOTE — Progress Notes (Signed)
COMMUNITY PALLIATIVE CARE RN NOTE  PATIENT NAME: SECUNDINO ELLITHORPE DOB: 18-Aug-1945 MRN: [id number]  PRIMARY CARE PROVIDER: Shirline Frees, MD  RESPONSIBLE PARTY:  Acct ID - Guarantor Home Phone Work Phone Relationship Acct Type  [id number] ISEAH, [phone] Self P/F     [place], [place], [place] [address]   Due to the COVID-19 crisis, this visit was done via telemedicine from my office and it was initiated and consent by this patient and or family.  PLAN OF CARE and INTERVENTION:  1. ADVANCE CARE PLANNING/GOALS OF CARE: Goal is for patient to eventually move to [facility] SNF when bed comes available. He is currently on the waiting list. He is a DNR. 2. PATIENT/CAREGIVER EDUCATION: Explained Palliative Care Services 3. DISEASE STATUS: Joint telemedicine visit completed with Palliative Care SW, Lynn Duffy. Visited with patient and his wife, [name]. Patient does experience some pain in his legs, lower back and hips. A pain patch is applied after his showers each day and he also alternates between Tylenol and Ibuprofen. Current regimen is effective. He requires 1 person assistance with bathing, dressing. At times he also requires help with feeding. He has difficulties at times reaching his hand to his mouth. Intermittent issues with swallowing. Some medications are given whole, however wife will cut larger pills in half. His intake is good. He is currently working with a Physical Therapist 2x/week for 4 more weeks. Wife states that he does well while in therapy, however begins to weaken once he is discharged from their services. He ambulates using a walker while in the home and wheelchair when travelling outside of the home. He has a Neupro patch along with Carvidopa-Levidopa to help with Parkinson symptoms. He has hired caregivers through the [place] M-F from 9a-12p. He is intermittent confused at times and at does not always recognize his home. He remains able to recognize family  members. He was able to answer simple questions during the visit. Patient and wife agreeable to future Palliative Care visits. Will continue to monitor.  HISTORY OF PRESENT ILLNESS:  This is a 73 yo male who resides at home with his wife. Palliative Care Team now involved for additional support. Will check in with patient monthly and PRN.  CODE STATUS: DNR ADVANCED DIRECTIVES: Y (HCPOA/Living Will) MOST FORM: no PPS: 40%   (Duration of visit and documentation 60 minutes)   Daryl Eastern, RN

## 2018-07-03 NOTE — Progress Notes (Signed)
COMMUNITY PALLIATIVE CARE SW NOTE  PATIENT NAME: Paul Ortiz DOB: 08-31-1945 MRN: [id number]  PRIMARY CARE PROVIDER: Shirline Frees, MD  RESPONSIBLE PARTY:  Acct ID - Guarantor Home Phone Work Phone Relationship Acct Type  [id number] ABHISHEK, [phone] Self P/F     [place], [place], [place] [address]   Due to the COVID-19 crisis, this virtual check-in visit was done via telephone from my office and it was initiated and consent given by thispatientand or family.  PLAN OF CARE and INTERVENTIONS:             1. GOALS OF CARE/ ADVANCE CARE PLANNING:  Goal is for patient to move to [facility] SNF when a bed is available.  Per patient's wife, the facility helps veterans offset the cost to reside there.  He is a DNR. 2. SOCIAL/EMOTIONAL/SPIRITUAL ASSESSMENT/ INTERVENTIONS:  SW conducted a Sales executive visit with patient's wife, [name].  She had contacted SW requesting additional information regarding Hospice.  Patient forgot to use his walker when standing last night, and fell.  [name] expressed concern that patient required additional assistance and thought Hospice provided 24 hour care.  SW provided additional education regarding Hospice services and eligibility.  She does not wish to move him to any facility other than [facility] at this time. 3. PATIENT/CAREGIVER EDUCATION/ COPING:  [name] copes by expressing her feelings openly. 4. PERSONAL EMERGENCY PLAN:  [name] will contact patient's MD or EMS accordingly. 5. COMMUNITY RESOURCES COORDINATION/ HEALTH CARE NAVIGATION:  Patient has CNA's through the [place] Mon-Fri from 9a-12p. 6. FINANCIAL/LEGAL CONCERNS/INTERVENTIONS:  None.     SOCIAL HX:  Social History   Tobacco Use  . Smoking status: Never Smoker  . Smokeless tobacco: Never Used  Substance Use Topics  . Alcohol use: No    CODE STATUS:  DNR  ADVANCED DIRECTIVES: LW, HCPOA MOST FORM COMPLETE:  N HOSPICE EDUCATION PROVIDED:  Y PPS:  Appetite is normal.  He  ambulates with a walker. Duration of visit and documentation:  30 minutes.      Creola Corn Denea Cheaney, LCSW

## 2018-07-03 NOTE — Progress Notes (Signed)
COMMUNITY PALLIATIVE CARE SW NOTE  PATIENT NAME: Paul Ortiz DOB: 1945/04/12 MRN: [id number]  PRIMARY CARE PROVIDER: Shirline Frees, MD  RESPONSIBLE PARTY:  Acct ID - Guarantor Home Phone Work Phone Relationship Acct Type  [id number] MOSTYN, [id number] [phone] Self P/F     [place], [place], [place] [address]   Due to the COVID-19 crisis, this virtual check-in visit was done via telephone from my office and it was initiated and consent given by thispatientand or family.  PLAN OF CARE and INTERVENTIONS:             1. GOALS OF CARE/ ADVANCE CARE PLANNING:  The goal for patient is to move to [facility] SNF.  Patient is a DNR. 2. SOCIAL/EMOTIONAL/SPIRITUAL ASSESSMENT/ INTERVENTIONS:  SW conducted a virtual check in visit with patient's wife, Paul Ortiz.  SW provided active listening and supportive counseling while she discussed patient's recent decline.  He didn't sleep well the night before and did not want to stay in the home.  Paul Ortiz participates in self care activities, but not as often as she would like.  She stated speech therapy was ordered yesterday. 3. PATIENT/CAREGIVER EDUCATION/ COPING:  SW provided education regarding Hospice services and eligibility.  Paul Ortiz expresses her feelings openly. 4. PERSONAL EMERGENCY PLAN:  Paul Ortiz will contact EMS or patient's MD. 5. COMMUNITY RESOURCES COORDINATION/ HEALTH CARE NAVIGATION:  He has a CNA from the [place] from 9a-12p Mon-Fri. 6. FINANCIAL/LEGAL CONCERNS/INTERVENTIONS:  None.     SOCIAL HX:  Social History   Tobacco Use  . Smoking status: Never Smoker  . Smokeless tobacco: Never Used  Substance Use Topics  . Alcohol use: No    CODE STATUS:  DNR  ADVANCED DIRECTIVES:  LW, HCPOA MOST FORM COMPLETE:  N HOSPICE EDUCATION PROVIDED: Y PPS:  His appetite is normal.  He uses a walker to ambulate in the home and a w/c outside. Duration of visit and documentation:  45 minutes.      Creola Corn Edrian Melucci, LCSW

## 2018-07-04 ENCOUNTER — Other Ambulatory Visit: Payer: Self-pay

## 2018-07-04 DIAGNOSIS — F411 Generalized anxiety disorder: Secondary | ICD-10-CM | POA: Diagnosis not present

## 2018-07-04 DIAGNOSIS — F3341 Major depressive disorder, recurrent, in partial remission: Secondary | ICD-10-CM | POA: Diagnosis not present

## 2018-07-04 DIAGNOSIS — I2510 Atherosclerotic heart disease of native coronary artery without angina pectoris: Secondary | ICD-10-CM | POA: Diagnosis not present

## 2018-07-04 DIAGNOSIS — I119 Hypertensive heart disease without heart failure: Secondary | ICD-10-CM | POA: Diagnosis not present

## 2018-07-04 DIAGNOSIS — E782 Mixed hyperlipidemia: Secondary | ICD-10-CM | POA: Diagnosis not present

## 2018-07-04 DIAGNOSIS — G20 Parkinson's disease: Secondary | ICD-10-CM | POA: Diagnosis not present

## 2018-07-06 ENCOUNTER — Ambulatory Visit: Payer: Medicare Other | Admitting: Neurology

## 2018-07-06 DIAGNOSIS — I2510 Atherosclerotic heart disease of native coronary artery without angina pectoris: Secondary | ICD-10-CM | POA: Diagnosis not present

## 2018-07-06 DIAGNOSIS — F3341 Major depressive disorder, recurrent, in partial remission: Secondary | ICD-10-CM | POA: Diagnosis not present

## 2018-07-06 DIAGNOSIS — G20 Parkinson's disease: Secondary | ICD-10-CM | POA: Diagnosis not present

## 2018-07-06 DIAGNOSIS — F411 Generalized anxiety disorder: Secondary | ICD-10-CM | POA: Diagnosis not present

## 2018-07-06 DIAGNOSIS — E782 Mixed hyperlipidemia: Secondary | ICD-10-CM | POA: Diagnosis not present

## 2018-07-06 DIAGNOSIS — I119 Hypertensive heart disease without heart failure: Secondary | ICD-10-CM | POA: Diagnosis not present

## 2018-07-10 DIAGNOSIS — E782 Mixed hyperlipidemia: Secondary | ICD-10-CM | POA: Diagnosis not present

## 2018-07-10 DIAGNOSIS — I2510 Atherosclerotic heart disease of native coronary artery without angina pectoris: Secondary | ICD-10-CM | POA: Diagnosis not present

## 2018-07-10 DIAGNOSIS — F3341 Major depressive disorder, recurrent, in partial remission: Secondary | ICD-10-CM | POA: Diagnosis not present

## 2018-07-10 DIAGNOSIS — G20 Parkinson's disease: Secondary | ICD-10-CM | POA: Diagnosis not present

## 2018-07-10 DIAGNOSIS — I119 Hypertensive heart disease without heart failure: Secondary | ICD-10-CM | POA: Diagnosis not present

## 2018-07-10 DIAGNOSIS — F411 Generalized anxiety disorder: Secondary | ICD-10-CM | POA: Diagnosis not present

## 2018-07-11 DIAGNOSIS — G20 Parkinson's disease: Secondary | ICD-10-CM | POA: Diagnosis not present

## 2018-07-11 DIAGNOSIS — F3341 Major depressive disorder, recurrent, in partial remission: Secondary | ICD-10-CM | POA: Diagnosis not present

## 2018-07-11 DIAGNOSIS — I119 Hypertensive heart disease without heart failure: Secondary | ICD-10-CM | POA: Diagnosis not present

## 2018-07-11 DIAGNOSIS — I2510 Atherosclerotic heart disease of native coronary artery without angina pectoris: Secondary | ICD-10-CM | POA: Diagnosis not present

## 2018-07-11 DIAGNOSIS — F411 Generalized anxiety disorder: Secondary | ICD-10-CM | POA: Diagnosis not present

## 2018-07-11 DIAGNOSIS — E782 Mixed hyperlipidemia: Secondary | ICD-10-CM | POA: Diagnosis not present

## 2018-07-12 ENCOUNTER — Encounter: Payer: Self-pay | Admitting: Neurology

## 2018-07-12 DIAGNOSIS — H50012 Monocular esotropia, left eye: Secondary | ICD-10-CM | POA: Diagnosis not present

## 2018-07-12 DIAGNOSIS — G20 Parkinson's disease: Secondary | ICD-10-CM | POA: Diagnosis not present

## 2018-07-12 DIAGNOSIS — Z9889 Other specified postprocedural states: Secondary | ICD-10-CM | POA: Diagnosis not present

## 2018-07-12 DIAGNOSIS — Z8669 Personal history of other diseases of the nervous system and sense organs: Secondary | ICD-10-CM | POA: Diagnosis not present

## 2018-07-12 DIAGNOSIS — H5022 Vertical strabismus, left eye: Secondary | ICD-10-CM | POA: Diagnosis not present

## 2018-07-13 DIAGNOSIS — G20 Parkinson's disease: Secondary | ICD-10-CM | POA: Diagnosis not present

## 2018-07-13 DIAGNOSIS — E782 Mixed hyperlipidemia: Secondary | ICD-10-CM | POA: Diagnosis not present

## 2018-07-13 DIAGNOSIS — I2510 Atherosclerotic heart disease of native coronary artery without angina pectoris: Secondary | ICD-10-CM | POA: Diagnosis not present

## 2018-07-13 DIAGNOSIS — F3341 Major depressive disorder, recurrent, in partial remission: Secondary | ICD-10-CM | POA: Diagnosis not present

## 2018-07-13 DIAGNOSIS — F411 Generalized anxiety disorder: Secondary | ICD-10-CM | POA: Diagnosis not present

## 2018-07-13 DIAGNOSIS — I119 Hypertensive heart disease without heart failure: Secondary | ICD-10-CM | POA: Diagnosis not present

## 2018-07-14 DIAGNOSIS — G4700 Insomnia, unspecified: Secondary | ICD-10-CM | POA: Diagnosis not present

## 2018-07-14 DIAGNOSIS — I2510 Atherosclerotic heart disease of native coronary artery without angina pectoris: Secondary | ICD-10-CM | POA: Diagnosis not present

## 2018-07-14 DIAGNOSIS — N200 Calculus of kidney: Secondary | ICD-10-CM | POA: Diagnosis not present

## 2018-07-14 DIAGNOSIS — I119 Hypertensive heart disease without heart failure: Secondary | ICD-10-CM | POA: Diagnosis not present

## 2018-07-14 DIAGNOSIS — G20 Parkinson's disease: Secondary | ICD-10-CM | POA: Diagnosis not present

## 2018-07-14 DIAGNOSIS — Z9181 History of falling: Secondary | ICD-10-CM | POA: Diagnosis not present

## 2018-07-14 DIAGNOSIS — Z7982 Long term (current) use of aspirin: Secondary | ICD-10-CM | POA: Diagnosis not present

## 2018-07-14 DIAGNOSIS — E559 Vitamin D deficiency, unspecified: Secondary | ICD-10-CM | POA: Diagnosis not present

## 2018-07-14 DIAGNOSIS — G4733 Obstructive sleep apnea (adult) (pediatric): Secondary | ICD-10-CM | POA: Diagnosis not present

## 2018-07-14 DIAGNOSIS — N400 Enlarged prostate without lower urinary tract symptoms: Secondary | ICD-10-CM | POA: Diagnosis not present

## 2018-07-14 DIAGNOSIS — E739 Lactose intolerance, unspecified: Secondary | ICD-10-CM | POA: Diagnosis not present

## 2018-07-14 DIAGNOSIS — J301 Allergic rhinitis due to pollen: Secondary | ICD-10-CM | POA: Diagnosis not present

## 2018-07-14 DIAGNOSIS — I252 Old myocardial infarction: Secondary | ICD-10-CM | POA: Diagnosis not present

## 2018-07-14 DIAGNOSIS — F3341 Major depressive disorder, recurrent, in partial remission: Secondary | ICD-10-CM | POA: Diagnosis not present

## 2018-07-14 DIAGNOSIS — K219 Gastro-esophageal reflux disease without esophagitis: Secondary | ICD-10-CM | POA: Diagnosis not present

## 2018-07-14 DIAGNOSIS — E782 Mixed hyperlipidemia: Secondary | ICD-10-CM | POA: Diagnosis not present

## 2018-07-14 DIAGNOSIS — G8929 Other chronic pain: Secondary | ICD-10-CM | POA: Diagnosis not present

## 2018-07-14 DIAGNOSIS — F411 Generalized anxiety disorder: Secondary | ICD-10-CM | POA: Diagnosis not present

## 2018-07-17 ENCOUNTER — Other Ambulatory Visit: Payer: Medicare Other | Admitting: Licensed Clinical Social Worker

## 2018-07-17 ENCOUNTER — Other Ambulatory Visit: Payer: Self-pay

## 2018-07-17 DIAGNOSIS — G20 Parkinson's disease: Secondary | ICD-10-CM | POA: Diagnosis not present

## 2018-07-17 DIAGNOSIS — I2510 Atherosclerotic heart disease of native coronary artery without angina pectoris: Secondary | ICD-10-CM | POA: Diagnosis not present

## 2018-07-17 DIAGNOSIS — F3341 Major depressive disorder, recurrent, in partial remission: Secondary | ICD-10-CM | POA: Diagnosis not present

## 2018-07-17 DIAGNOSIS — F411 Generalized anxiety disorder: Secondary | ICD-10-CM | POA: Diagnosis not present

## 2018-07-17 DIAGNOSIS — Z515 Encounter for palliative care: Secondary | ICD-10-CM

## 2018-07-17 DIAGNOSIS — I119 Hypertensive heart disease without heart failure: Secondary | ICD-10-CM | POA: Diagnosis not present

## 2018-07-17 DIAGNOSIS — E782 Mixed hyperlipidemia: Secondary | ICD-10-CM | POA: Diagnosis not present

## 2018-07-17 NOTE — Progress Notes (Signed)
COMMUNITY PALLIATIVE CARE SW NOTE  PATIENT NAME: SYNCERE EBLE DOB: Jan 08, 1946 MRN: [id number]  PRIMARY CARE PROVIDER: Shirline Frees, MD  RESPONSIBLE PARTY:  Acct ID - Guarantor Home Phone Work Phone Relationship Acct Type  [id number] MICHAL, [phone] Self P/F     [place], [place],  [address]   Due to the COVID-19 crisis, this virtual check-in visit was done via telephone from my office and it was initiated and consent given by thispatientand or family.   PLAN OF CARE and INTERVENTIONS:             1. GOALS OF CARE/ ADVANCE CARE PLANNING:  Patient is on the waiting list at [facility].  The goal is for patient to move to the facility.  Patient is a DNR. 2. SOCIAL/EMOTIONAL/SPIRITUAL ASSESSMENT/ INTERVENTIONS:  SW conducted a virtual check-in visit with patient.  There was a zoom meeting scheduled today, but [name] wished to move the meeting to next week.  She confirmed the education SW provided last week regarding Hospice.  She stated she and patient have a very busy week and that speech therapy was visiting today.  The plan is for SW to contact [facility] on Monday to schedule another meeting. 3. PATIENT/CAREGIVER EDUCATION/ COPING:  Patient's wife expresses her feelings openly. 4. PERSONAL EMERGENCY PLAN:  Patient's wife will contact EMS. 5. COMMUNITY RESOURCES COORDINATION/ HEALTH CARE NAVIGATION:  Patient has [facility] from 9a-12p, Monday through Friday. 6. FINANCIAL/LEGAL CONCERNS/INTERVENTIONS:  None.     SOCIAL HX:  Social History   Tobacco Use  . Smoking status: Never Smoker  . Smokeless tobacco: Never Used  Substance Use Topics  . Alcohol use: No    CODE STATUS:  DNR  ADVANCED DIRECTIVES:  LW, HCPOA MOST FORM COMPLETE:  N HOSPICE EDUCATION PROVIDED:  N PPS:  Appetite is normal.  He uses a walker to ambulate. Duration of visit and documentation:  30 minutes.      Creola Corn Adryel Wortmann, LCSW

## 2018-07-18 DIAGNOSIS — G20 Parkinson's disease: Secondary | ICD-10-CM | POA: Diagnosis not present

## 2018-07-18 DIAGNOSIS — F411 Generalized anxiety disorder: Secondary | ICD-10-CM | POA: Diagnosis not present

## 2018-07-18 DIAGNOSIS — I119 Hypertensive heart disease without heart failure: Secondary | ICD-10-CM | POA: Diagnosis not present

## 2018-07-18 DIAGNOSIS — I2510 Atherosclerotic heart disease of native coronary artery without angina pectoris: Secondary | ICD-10-CM | POA: Diagnosis not present

## 2018-07-18 DIAGNOSIS — F3341 Major depressive disorder, recurrent, in partial remission: Secondary | ICD-10-CM | POA: Diagnosis not present

## 2018-07-18 DIAGNOSIS — E782 Mixed hyperlipidemia: Secondary | ICD-10-CM | POA: Diagnosis not present

## 2018-07-19 DIAGNOSIS — F411 Generalized anxiety disorder: Secondary | ICD-10-CM | POA: Diagnosis not present

## 2018-07-19 DIAGNOSIS — I119 Hypertensive heart disease without heart failure: Secondary | ICD-10-CM | POA: Diagnosis not present

## 2018-07-19 DIAGNOSIS — F3341 Major depressive disorder, recurrent, in partial remission: Secondary | ICD-10-CM | POA: Diagnosis not present

## 2018-07-19 DIAGNOSIS — G20 Parkinson's disease: Secondary | ICD-10-CM | POA: Diagnosis not present

## 2018-07-19 DIAGNOSIS — E782 Mixed hyperlipidemia: Secondary | ICD-10-CM | POA: Diagnosis not present

## 2018-07-19 DIAGNOSIS — I2510 Atherosclerotic heart disease of native coronary artery without angina pectoris: Secondary | ICD-10-CM | POA: Diagnosis not present

## 2018-07-21 DIAGNOSIS — E782 Mixed hyperlipidemia: Secondary | ICD-10-CM | POA: Diagnosis not present

## 2018-07-21 DIAGNOSIS — I2510 Atherosclerotic heart disease of native coronary artery without angina pectoris: Secondary | ICD-10-CM | POA: Diagnosis not present

## 2018-07-21 DIAGNOSIS — F411 Generalized anxiety disorder: Secondary | ICD-10-CM | POA: Diagnosis not present

## 2018-07-21 DIAGNOSIS — G20 Parkinson's disease: Secondary | ICD-10-CM | POA: Diagnosis not present

## 2018-07-21 DIAGNOSIS — I119 Hypertensive heart disease without heart failure: Secondary | ICD-10-CM | POA: Diagnosis not present

## 2018-07-21 DIAGNOSIS — F3341 Major depressive disorder, recurrent, in partial remission: Secondary | ICD-10-CM | POA: Diagnosis not present

## 2018-07-24 DIAGNOSIS — E782 Mixed hyperlipidemia: Secondary | ICD-10-CM | POA: Diagnosis not present

## 2018-07-24 DIAGNOSIS — F411 Generalized anxiety disorder: Secondary | ICD-10-CM | POA: Diagnosis not present

## 2018-07-24 DIAGNOSIS — F3341 Major depressive disorder, recurrent, in partial remission: Secondary | ICD-10-CM | POA: Diagnosis not present

## 2018-07-24 DIAGNOSIS — G20 Parkinson's disease: Secondary | ICD-10-CM | POA: Diagnosis not present

## 2018-07-24 DIAGNOSIS — I2510 Atherosclerotic heart disease of native coronary artery without angina pectoris: Secondary | ICD-10-CM | POA: Diagnosis not present

## 2018-07-24 DIAGNOSIS — I119 Hypertensive heart disease without heart failure: Secondary | ICD-10-CM | POA: Diagnosis not present

## 2018-07-25 DIAGNOSIS — F411 Generalized anxiety disorder: Secondary | ICD-10-CM | POA: Diagnosis not present

## 2018-07-25 DIAGNOSIS — I119 Hypertensive heart disease without heart failure: Secondary | ICD-10-CM | POA: Diagnosis not present

## 2018-07-25 DIAGNOSIS — E782 Mixed hyperlipidemia: Secondary | ICD-10-CM | POA: Diagnosis not present

## 2018-07-25 DIAGNOSIS — F3341 Major depressive disorder, recurrent, in partial remission: Secondary | ICD-10-CM | POA: Diagnosis not present

## 2018-07-25 DIAGNOSIS — G20 Parkinson's disease: Secondary | ICD-10-CM | POA: Diagnosis not present

## 2018-07-25 DIAGNOSIS — I2510 Atherosclerotic heart disease of native coronary artery without angina pectoris: Secondary | ICD-10-CM | POA: Diagnosis not present

## 2018-07-26 DIAGNOSIS — E782 Mixed hyperlipidemia: Secondary | ICD-10-CM | POA: Diagnosis not present

## 2018-07-26 DIAGNOSIS — I119 Hypertensive heart disease without heart failure: Secondary | ICD-10-CM | POA: Diagnosis not present

## 2018-07-26 DIAGNOSIS — I2510 Atherosclerotic heart disease of native coronary artery without angina pectoris: Secondary | ICD-10-CM | POA: Diagnosis not present

## 2018-07-26 DIAGNOSIS — G20 Parkinson's disease: Secondary | ICD-10-CM | POA: Diagnosis not present

## 2018-07-26 DIAGNOSIS — F3341 Major depressive disorder, recurrent, in partial remission: Secondary | ICD-10-CM | POA: Diagnosis not present

## 2018-07-26 DIAGNOSIS — F411 Generalized anxiety disorder: Secondary | ICD-10-CM | POA: Diagnosis not present

## 2018-07-27 DIAGNOSIS — F3341 Major depressive disorder, recurrent, in partial remission: Secondary | ICD-10-CM | POA: Diagnosis not present

## 2018-07-27 DIAGNOSIS — E782 Mixed hyperlipidemia: Secondary | ICD-10-CM | POA: Diagnosis not present

## 2018-07-27 DIAGNOSIS — I119 Hypertensive heart disease without heart failure: Secondary | ICD-10-CM | POA: Diagnosis not present

## 2018-07-27 DIAGNOSIS — F411 Generalized anxiety disorder: Secondary | ICD-10-CM | POA: Diagnosis not present

## 2018-07-27 DIAGNOSIS — I2510 Atherosclerotic heart disease of native coronary artery without angina pectoris: Secondary | ICD-10-CM | POA: Diagnosis not present

## 2018-07-27 DIAGNOSIS — G20 Parkinson's disease: Secondary | ICD-10-CM | POA: Diagnosis not present

## 2018-07-31 DIAGNOSIS — I119 Hypertensive heart disease without heart failure: Secondary | ICD-10-CM | POA: Diagnosis not present

## 2018-07-31 DIAGNOSIS — E782 Mixed hyperlipidemia: Secondary | ICD-10-CM | POA: Diagnosis not present

## 2018-07-31 DIAGNOSIS — F3341 Major depressive disorder, recurrent, in partial remission: Secondary | ICD-10-CM | POA: Diagnosis not present

## 2018-07-31 DIAGNOSIS — F411 Generalized anxiety disorder: Secondary | ICD-10-CM | POA: Diagnosis not present

## 2018-07-31 DIAGNOSIS — G20 Parkinson's disease: Secondary | ICD-10-CM | POA: Diagnosis not present

## 2018-07-31 DIAGNOSIS — I2510 Atherosclerotic heart disease of native coronary artery without angina pectoris: Secondary | ICD-10-CM | POA: Diagnosis not present

## 2018-07-31 NOTE — Telephone Encounter (Signed)
  SW spoke w pt's wife [name] for 20 min to provide brief needs assessment and review pt past engagement in support groups. Pt is waitlisted for SNF at [facility], currently receives in-home care 9am-12pm daily, wife provided 30 days respite every 6 mo through [facility] [place] but she utilizes those for overnight in-home care. Pt attends PT 2x wk, she notes some barriers w coverage.  Mobility a major limitation for.pt as pt is wheelchair bound-wife identifies this as potential barrier to future engagement w support groups. She also reports pt attended group 1x but had incontinence and so was embarrassed to return. She described pt as reserved and unlikely to talk much in group. SW provided supportive counseling and we discussed emotional benefits of attending group. She is interested in online group once started, her email is [email or web address] for invite. SW mailed new pt packet and encouraged [name] to reach out to SW as needed.    Routing comment

## 2018-08-01 DIAGNOSIS — F411 Generalized anxiety disorder: Secondary | ICD-10-CM | POA: Diagnosis not present

## 2018-08-01 DIAGNOSIS — G20 Parkinson's disease: Secondary | ICD-10-CM | POA: Diagnosis not present

## 2018-08-01 DIAGNOSIS — F3341 Major depressive disorder, recurrent, in partial remission: Secondary | ICD-10-CM | POA: Diagnosis not present

## 2018-08-01 DIAGNOSIS — I119 Hypertensive heart disease without heart failure: Secondary | ICD-10-CM | POA: Diagnosis not present

## 2018-08-01 DIAGNOSIS — E782 Mixed hyperlipidemia: Secondary | ICD-10-CM | POA: Diagnosis not present

## 2018-08-01 DIAGNOSIS — I2510 Atherosclerotic heart disease of native coronary artery without angina pectoris: Secondary | ICD-10-CM | POA: Diagnosis not present

## 2018-08-03 ENCOUNTER — Telehealth: Payer: Self-pay | Admitting: Neurology

## 2018-08-03 DIAGNOSIS — I119 Hypertensive heart disease without heart failure: Secondary | ICD-10-CM | POA: Diagnosis not present

## 2018-08-03 DIAGNOSIS — I2510 Atherosclerotic heart disease of native coronary artery without angina pectoris: Secondary | ICD-10-CM | POA: Diagnosis not present

## 2018-08-03 DIAGNOSIS — F3341 Major depressive disorder, recurrent, in partial remission: Secondary | ICD-10-CM | POA: Diagnosis not present

## 2018-08-03 DIAGNOSIS — G20 Parkinson's disease: Secondary | ICD-10-CM | POA: Diagnosis not present

## 2018-08-03 DIAGNOSIS — F411 Generalized anxiety disorder: Secondary | ICD-10-CM | POA: Diagnosis not present

## 2018-08-03 DIAGNOSIS — E782 Mixed hyperlipidemia: Secondary | ICD-10-CM | POA: Diagnosis not present

## 2018-08-03 NOTE — Telephone Encounter (Signed)
Spoke with [name] she states that she did reduce Rotigotine dose to 1/4 1 time after appt. Pt started complaining of pain so she stop cutting patches and gave him a whole patch to help with pain. She would like to know if there is any other suggestions for medication. She would like to keep patches to help with patient pain.  She will contact PCP in regards to patient Gabapentin. She is aware provider will be out of the office until Monday message will have to wait until provider returns. She is aware and understands

## 2018-08-03 NOTE — Telephone Encounter (Signed)
1.  Did they ever reduce the rotigitine patch as I suggested? 2.  Did they talk to PCP about the gabapentin (see note) that I suspected was contributing to confusion/hallucinations?  Tell them that I am off for the weekend and call back will not be answered until Monday.  If urgent, go to [facility]

## 2018-08-03 NOTE — Telephone Encounter (Signed)
Called spoke with patient spouse [name] she says that patient is experience sundowning later in the day. Confusion   Pt called with c/o:  confusion/hallucinations New medications?  No. When did they start?  N/A If hallucinations are new, has patient been checked for infection, including UTI?  No. Current medications prescribed by Dr. Carles Collet and TIMES taking the medications: NONE    Later in day he gets confused, by bedtime he talks in sleep or up to use bathroom, very anxious, she says that he confused about where he is going to bed, watch church services on tv later that night, patient was confused about why he was talking his clothes off at church. Within the last 3 weeks confusions has gotten really bad.

## 2018-08-03 NOTE — Telephone Encounter (Signed)
Wife said that things are getting worse. He is a lot to handle. They have home health coming in and she just wanted you to call her back about some medication management. Please call her back at [phone]. Thanks!

## 2018-08-07 NOTE — Telephone Encounter (Signed)
Then I would recommend a pain management physician to help with this because rotigitine isn't a pain drug and it contributes to confusion and hallucinations and I have never heard of it being used for pain.

## 2018-08-07 NOTE — Telephone Encounter (Signed)
The patches don't really help with pain and are only going to contribute to confusion and hallucinations.  We really need to work on reducing that.

## 2018-08-07 NOTE — Telephone Encounter (Signed)
Follow up  Pts wife returned call, lvm

## 2018-08-07 NOTE — Telephone Encounter (Signed)
Called spoke with patient spouse she states that the patches are helping with patient pain she does not want to reduce the patch. She states if she reduce the patch then he will need something to replace it to help with his leg pain.  She did contact PCP office in regards to GABAPENTIN she was  told to have patient stop gabapentin which she did do. She will call PCP office with a update on patient since stopping medication today. He has done a lot better off this medication.

## 2018-08-07 NOTE — Telephone Encounter (Signed)
Spoke with patient spouse again, she states that the neurologist at the [facility] gave him the patch to help with his leg pain and it is helping him she says. She declines pain management provider and decided to have PCP follow up on patient leg pain instead. Once she received instructions from PCP on what to do with patient leg pain and if he says to stop the patch she will stop it. She will follow up with office once patient is completely off patch.

## 2018-08-07 NOTE — Telephone Encounter (Signed)
Called patient spouse back no answer left message that provider recommends patient see pain management physicians for his leg pain.

## 2018-08-08 DIAGNOSIS — G20 Parkinson's disease: Secondary | ICD-10-CM | POA: Diagnosis not present

## 2018-08-08 DIAGNOSIS — I119 Hypertensive heart disease without heart failure: Secondary | ICD-10-CM | POA: Diagnosis not present

## 2018-08-08 DIAGNOSIS — I2510 Atherosclerotic heart disease of native coronary artery without angina pectoris: Secondary | ICD-10-CM | POA: Diagnosis not present

## 2018-08-08 DIAGNOSIS — E782 Mixed hyperlipidemia: Secondary | ICD-10-CM | POA: Diagnosis not present

## 2018-08-08 DIAGNOSIS — F3341 Major depressive disorder, recurrent, in partial remission: Secondary | ICD-10-CM | POA: Diagnosis not present

## 2018-08-08 DIAGNOSIS — F411 Generalized anxiety disorder: Secondary | ICD-10-CM | POA: Diagnosis not present

## 2018-08-10 DIAGNOSIS — I119 Hypertensive heart disease without heart failure: Secondary | ICD-10-CM | POA: Diagnosis not present

## 2018-08-10 DIAGNOSIS — I2510 Atherosclerotic heart disease of native coronary artery without angina pectoris: Secondary | ICD-10-CM | POA: Diagnosis not present

## 2018-08-10 DIAGNOSIS — E782 Mixed hyperlipidemia: Secondary | ICD-10-CM | POA: Diagnosis not present

## 2018-08-10 DIAGNOSIS — F3341 Major depressive disorder, recurrent, in partial remission: Secondary | ICD-10-CM | POA: Diagnosis not present

## 2018-08-10 DIAGNOSIS — G20 Parkinson's disease: Secondary | ICD-10-CM | POA: Diagnosis not present

## 2018-08-10 DIAGNOSIS — F411 Generalized anxiety disorder: Secondary | ICD-10-CM | POA: Diagnosis not present

## 2018-08-13 DIAGNOSIS — E739 Lactose intolerance, unspecified: Secondary | ICD-10-CM | POA: Diagnosis not present

## 2018-08-13 DIAGNOSIS — N400 Enlarged prostate without lower urinary tract symptoms: Secondary | ICD-10-CM | POA: Diagnosis not present

## 2018-08-13 DIAGNOSIS — F3341 Major depressive disorder, recurrent, in partial remission: Secondary | ICD-10-CM | POA: Diagnosis not present

## 2018-08-13 DIAGNOSIS — N200 Calculus of kidney: Secondary | ICD-10-CM | POA: Diagnosis not present

## 2018-08-13 DIAGNOSIS — Z9181 History of falling: Secondary | ICD-10-CM | POA: Diagnosis not present

## 2018-08-13 DIAGNOSIS — G4700 Insomnia, unspecified: Secondary | ICD-10-CM | POA: Diagnosis not present

## 2018-08-13 DIAGNOSIS — Z7982 Long term (current) use of aspirin: Secondary | ICD-10-CM | POA: Diagnosis not present

## 2018-08-13 DIAGNOSIS — G8929 Other chronic pain: Secondary | ICD-10-CM | POA: Diagnosis not present

## 2018-08-13 DIAGNOSIS — J301 Allergic rhinitis due to pollen: Secondary | ICD-10-CM | POA: Diagnosis not present

## 2018-08-13 DIAGNOSIS — G4733 Obstructive sleep apnea (adult) (pediatric): Secondary | ICD-10-CM | POA: Diagnosis not present

## 2018-08-13 DIAGNOSIS — E782 Mixed hyperlipidemia: Secondary | ICD-10-CM | POA: Diagnosis not present

## 2018-08-13 DIAGNOSIS — I119 Hypertensive heart disease without heart failure: Secondary | ICD-10-CM | POA: Diagnosis not present

## 2018-08-13 DIAGNOSIS — I2510 Atherosclerotic heart disease of native coronary artery without angina pectoris: Secondary | ICD-10-CM | POA: Diagnosis not present

## 2018-08-13 DIAGNOSIS — K219 Gastro-esophageal reflux disease without esophagitis: Secondary | ICD-10-CM | POA: Diagnosis not present

## 2018-08-13 DIAGNOSIS — F411 Generalized anxiety disorder: Secondary | ICD-10-CM | POA: Diagnosis not present

## 2018-08-13 DIAGNOSIS — E559 Vitamin D deficiency, unspecified: Secondary | ICD-10-CM | POA: Diagnosis not present

## 2018-08-13 DIAGNOSIS — I252 Old myocardial infarction: Secondary | ICD-10-CM | POA: Diagnosis not present

## 2018-08-13 DIAGNOSIS — G20 Parkinson's disease: Secondary | ICD-10-CM | POA: Diagnosis not present

## 2018-08-15 DIAGNOSIS — I119 Hypertensive heart disease without heart failure: Secondary | ICD-10-CM | POA: Diagnosis not present

## 2018-08-15 DIAGNOSIS — E782 Mixed hyperlipidemia: Secondary | ICD-10-CM | POA: Diagnosis not present

## 2018-08-15 DIAGNOSIS — G20 Parkinson's disease: Secondary | ICD-10-CM | POA: Diagnosis not present

## 2018-08-15 DIAGNOSIS — F411 Generalized anxiety disorder: Secondary | ICD-10-CM | POA: Diagnosis not present

## 2018-08-15 DIAGNOSIS — I2510 Atherosclerotic heart disease of native coronary artery without angina pectoris: Secondary | ICD-10-CM | POA: Diagnosis not present

## 2018-08-15 DIAGNOSIS — F3341 Major depressive disorder, recurrent, in partial remission: Secondary | ICD-10-CM | POA: Diagnosis not present

## 2018-08-17 DIAGNOSIS — F3341 Major depressive disorder, recurrent, in partial remission: Secondary | ICD-10-CM | POA: Diagnosis not present

## 2018-08-17 DIAGNOSIS — E782 Mixed hyperlipidemia: Secondary | ICD-10-CM | POA: Diagnosis not present

## 2018-08-17 DIAGNOSIS — I2510 Atherosclerotic heart disease of native coronary artery without angina pectoris: Secondary | ICD-10-CM | POA: Diagnosis not present

## 2018-08-17 DIAGNOSIS — F411 Generalized anxiety disorder: Secondary | ICD-10-CM | POA: Diagnosis not present

## 2018-08-17 DIAGNOSIS — I119 Hypertensive heart disease without heart failure: Secondary | ICD-10-CM | POA: Diagnosis not present

## 2018-08-17 DIAGNOSIS — G20 Parkinson's disease: Secondary | ICD-10-CM | POA: Diagnosis not present

## 2018-08-22 ENCOUNTER — Telehealth: Payer: Self-pay | Admitting: Neurology

## 2018-08-22 DIAGNOSIS — F411 Generalized anxiety disorder: Secondary | ICD-10-CM | POA: Diagnosis not present

## 2018-08-22 DIAGNOSIS — G20 Parkinson's disease: Secondary | ICD-10-CM | POA: Diagnosis not present

## 2018-08-22 DIAGNOSIS — E782 Mixed hyperlipidemia: Secondary | ICD-10-CM | POA: Diagnosis not present

## 2018-08-22 DIAGNOSIS — I119 Hypertensive heart disease without heart failure: Secondary | ICD-10-CM | POA: Diagnosis not present

## 2018-08-22 DIAGNOSIS — F3341 Major depressive disorder, recurrent, in partial remission: Secondary | ICD-10-CM | POA: Diagnosis not present

## 2018-08-22 DIAGNOSIS — I2510 Atherosclerotic heart disease of native coronary artery without angina pectoris: Secondary | ICD-10-CM | POA: Diagnosis not present

## 2018-08-22 NOTE — Telephone Encounter (Signed)
Received verbal from Dr. Carles Collet that patient needs to come off of Neupro patch. Can look unto increasing carbidopa-levodopa in the future. Spoke with patient spouse she is still hesitant with taken patient off. Will discuss with her daughter first.  Pt spouse ask if Dr. Carles Collet thinks that he should go back to his other neurology. I spoke with her concerning this. Per Dr. Carles Collet she feels as if spouse doesn't trust her judgement and if she feel comfortable with the other neurologist she is more the welcome to transfer his care.   Spouse states that she does trust provider judgement she just doesn't know what to do with all the other providers helping with patient care and their opinions can be confusing.  Pt spouse aware to keep [place] posted if she decides to take patient off patch as provider requested

## 2018-08-22 NOTE — Telephone Encounter (Signed)
I understand and its really hard.  They have called several times (see prior message) and several times I have advised that neupro is contributing and that we need to get off of this and they refuse.  They think that it treats pain but it doesn't.

## 2018-08-22 NOTE — Telephone Encounter (Signed)
Called patient spouse no answer left message to call office back

## 2018-08-22 NOTE — Telephone Encounter (Signed)
New Message  Patient's wife verbalized she had hard time with patient last night throwing things, yelling all night, begging to take him home and trying to leave the house.   Please f/u

## 2018-08-24 DIAGNOSIS — F411 Generalized anxiety disorder: Secondary | ICD-10-CM | POA: Diagnosis not present

## 2018-08-24 DIAGNOSIS — G20 Parkinson's disease: Secondary | ICD-10-CM | POA: Diagnosis not present

## 2018-08-24 DIAGNOSIS — I2510 Atherosclerotic heart disease of native coronary artery without angina pectoris: Secondary | ICD-10-CM | POA: Diagnosis not present

## 2018-08-24 DIAGNOSIS — I119 Hypertensive heart disease without heart failure: Secondary | ICD-10-CM | POA: Diagnosis not present

## 2018-08-24 DIAGNOSIS — E782 Mixed hyperlipidemia: Secondary | ICD-10-CM | POA: Diagnosis not present

## 2018-08-24 DIAGNOSIS — F3341 Major depressive disorder, recurrent, in partial remission: Secondary | ICD-10-CM | POA: Diagnosis not present

## 2018-08-25 DIAGNOSIS — M545 Low back pain: Secondary | ICD-10-CM | POA: Diagnosis not present

## 2018-08-25 DIAGNOSIS — E782 Mixed hyperlipidemia: Secondary | ICD-10-CM | POA: Diagnosis not present

## 2018-08-25 DIAGNOSIS — F419 Anxiety disorder, unspecified: Secondary | ICD-10-CM | POA: Diagnosis not present

## 2018-08-25 DIAGNOSIS — I10 Essential (primary) hypertension: Secondary | ICD-10-CM | POA: Diagnosis not present

## 2018-08-25 DIAGNOSIS — R410 Disorientation, unspecified: Secondary | ICD-10-CM | POA: Diagnosis not present

## 2018-08-25 DIAGNOSIS — G20 Parkinson's disease: Secondary | ICD-10-CM | POA: Diagnosis not present

## 2018-08-25 DIAGNOSIS — R300 Dysuria: Secondary | ICD-10-CM | POA: Diagnosis not present

## 2018-08-28 DIAGNOSIS — F3341 Major depressive disorder, recurrent, in partial remission: Secondary | ICD-10-CM | POA: Diagnosis not present

## 2018-08-28 DIAGNOSIS — I10 Essential (primary) hypertension: Secondary | ICD-10-CM | POA: Diagnosis not present

## 2018-08-28 DIAGNOSIS — E7800 Pure hypercholesterolemia, unspecified: Secondary | ICD-10-CM | POA: Diagnosis not present

## 2018-08-28 DIAGNOSIS — E782 Mixed hyperlipidemia: Secondary | ICD-10-CM | POA: Diagnosis not present

## 2018-08-28 DIAGNOSIS — G20 Parkinson's disease: Secondary | ICD-10-CM | POA: Diagnosis not present

## 2018-08-28 DIAGNOSIS — N401 Enlarged prostate with lower urinary tract symptoms: Secondary | ICD-10-CM | POA: Diagnosis not present

## 2018-08-28 DIAGNOSIS — F329 Major depressive disorder, single episode, unspecified: Secondary | ICD-10-CM | POA: Diagnosis not present

## 2018-08-28 DIAGNOSIS — I2510 Atherosclerotic heart disease of native coronary artery without angina pectoris: Secondary | ICD-10-CM | POA: Diagnosis not present

## 2018-08-29 DIAGNOSIS — E782 Mixed hyperlipidemia: Secondary | ICD-10-CM | POA: Diagnosis not present

## 2018-08-29 DIAGNOSIS — I119 Hypertensive heart disease without heart failure: Secondary | ICD-10-CM | POA: Diagnosis not present

## 2018-08-29 DIAGNOSIS — G20 Parkinson's disease: Secondary | ICD-10-CM | POA: Diagnosis not present

## 2018-08-29 DIAGNOSIS — I2510 Atherosclerotic heart disease of native coronary artery without angina pectoris: Secondary | ICD-10-CM | POA: Diagnosis not present

## 2018-08-29 DIAGNOSIS — F411 Generalized anxiety disorder: Secondary | ICD-10-CM | POA: Diagnosis not present

## 2018-08-29 DIAGNOSIS — F3341 Major depressive disorder, recurrent, in partial remission: Secondary | ICD-10-CM | POA: Diagnosis not present

## 2018-08-31 ENCOUNTER — Telehealth: Payer: Self-pay | Admitting: Neurology

## 2018-08-31 DIAGNOSIS — I2510 Atherosclerotic heart disease of native coronary artery without angina pectoris: Secondary | ICD-10-CM | POA: Diagnosis not present

## 2018-08-31 DIAGNOSIS — G20 Parkinson's disease: Secondary | ICD-10-CM | POA: Diagnosis not present

## 2018-08-31 DIAGNOSIS — I119 Hypertensive heart disease without heart failure: Secondary | ICD-10-CM | POA: Diagnosis not present

## 2018-08-31 DIAGNOSIS — E782 Mixed hyperlipidemia: Secondary | ICD-10-CM | POA: Diagnosis not present

## 2018-08-31 DIAGNOSIS — F3341 Major depressive disorder, recurrent, in partial remission: Secondary | ICD-10-CM | POA: Diagnosis not present

## 2018-08-31 DIAGNOSIS — F411 Generalized anxiety disorder: Secondary | ICD-10-CM | POA: Diagnosis not present

## 2018-08-31 NOTE — Telephone Encounter (Signed)
Called spoke with spouse [name] she states that she still has some of the Seroquel she will start him back on it when the home aide is with him.  ~note can stay for Dr. Carles Collet can address when she returns

## 2018-08-31 NOTE — Telephone Encounter (Signed)
Spoke with patient spouse she states that he slept all night some nights then some nights he gets confused during the day, and  he wakes up around 1:00 am talking out of his head until 5:00 am.  Pt spouse states that he has been off neupro patch for 2 weeks now  Requesting something to help patient sleep at night  Pt spouse says that he is applying to put patient into a facility that the [place] will cover for him.  He has home health aide/caregiver staying 5 nights a week to help her. She states that carrying for him has affected her physically/ mentally

## 2018-08-31 NOTE — Telephone Encounter (Signed)
Spoke with Paul Ortiz she is on the road on her way home will call office back with dose of mediation.  She did say that Hallucinations/ behavioral issues are mostly at night, but also starts around 3:00 pm in the afternoon.

## 2018-08-31 NOTE — Telephone Encounter (Signed)
OK to take quetiapine 12.5mg  at bedtime for now, if it is not making him to tired, this may be increased in the future.

## 2018-08-31 NOTE — Telephone Encounter (Signed)
Pt wife states that the patient stop the patch about 2 weeks ago and he has slept some at nights but last night he was up most of the night and talking out of his head she would like to speak to some one please call   She has appt at 2:00 so you can leave a message on her phone if it is after 2

## 2018-08-31 NOTE — Telephone Encounter (Signed)
Called spoke patient spouse she aware and understands  Will give Rx to patient when his caregiver/ home health aide is there at night

## 2018-08-31 NOTE — Telephone Encounter (Signed)
Hi Apple, looks like the wife has discussed the sleep issues and behavioral issues with Dr. Carles Collet in the past but I don't see her prescribing anything for it. Pls let wife know Dr. Carles Collet is out until Monday, would wait until then or if she would like to re-try the Seroquel that he was on in the past to help over the weekend. Thanks Message from Dr. Delice Lesch

## 2018-08-31 NOTE — Telephone Encounter (Signed)
Find out the dose she has at home.  Are hallucinations/behavioral issues mostly at night/evening or in morning as well

## 2018-08-31 NOTE — Telephone Encounter (Signed)
Spoke w/ spouse [name] patient has Quetiapine Fumarate 25 mg 0.5 tablets at bedtime   See below regarding hallucinations/ behavioral issues

## 2018-09-01 ENCOUNTER — Other Ambulatory Visit: Payer: Medicare Other | Admitting: Licensed Clinical Social Worker

## 2018-09-01 ENCOUNTER — Other Ambulatory Visit: Payer: Self-pay

## 2018-09-01 DIAGNOSIS — Z515 Encounter for palliative care: Secondary | ICD-10-CM

## 2018-09-01 NOTE — Progress Notes (Signed)
COMMUNITY PALLIATIVE CARE SW NOTE  PATIENT NAME: Paul Ortiz DOB: 1945/09/19 MRN: [id number]  PRIMARY CARE PROVIDER: Shirline Frees, MD  RESPONSIBLE PARTY:  Acct ID - Guarantor Home Phone Work Phone Relationship Acct Type  [id number] Paul Ortiz, [phone] Self P/F     [place], [place], [place] [address]   Due to the COVID-19 crisis, this virtual check-in visit was done via telephone from my office and it was initiated and consent given by thispatientand or family.  PLAN OF CARE and INTERVENTIONS:             1. GOALS OF CARE/ ADVANCE CARE PLANNING:  Goal is for patient to move to [facility] SNF.  He is a DNR. 2. SOCIAL/EMOTIONAL/SPIRITUAL ASSESSMENT/ INTERVENTIONS:  SW conducted a Sales executive visit with patient's wife, Paul Ortiz.  She stated patient's medication has been changed, and he slept soundly last night.  This resulted in patient being happier with a clearer mind.  SW provided active listening and supportive counseling while she discussed her caregiver challenges. 3. PATIENT/CAREGIVER EDUCATION/ COPING:  Patient's wife copes by expressing her feelings openly. 4. PERSONAL EMERGENCY PLAN:  Patient's wife will contact his MD. 5. COMMUNITY RESOURCES COORDINATION/ HEALTH CARE NAVIGATION:  Patient has [facility] C[facility] from 9a-12p, Monday - Friday.  Paul Ortiz has recently hired a CNA from 9p-5a because she was not sleeping enough. 6. FINANCIAL/LEGAL CONCERNS/INTERVENTIONS:  None.     SOCIAL HX:  Social History   Tobacco Use  . Smoking status: Never Smoker  . Smokeless tobacco: Never Used  Substance Use Topics  . Alcohol use: No    CODE STATUS:  DNR ADVANCED DIRECTIVES: LW, HCPOA MOST FORM COMPLETE:  N HOSPICE EDUCATION PROVIDED: N PPS:  Patient's appetite has decreased.  He continues to use a walker to ambulate. Duration of visit and documentation:  45 minutes.      Paul Corn Gerron Guidotti, LCSW

## 2018-09-05 DIAGNOSIS — F3341 Major depressive disorder, recurrent, in partial remission: Secondary | ICD-10-CM | POA: Diagnosis not present

## 2018-09-05 DIAGNOSIS — E782 Mixed hyperlipidemia: Secondary | ICD-10-CM | POA: Diagnosis not present

## 2018-09-05 DIAGNOSIS — I2510 Atherosclerotic heart disease of native coronary artery without angina pectoris: Secondary | ICD-10-CM | POA: Diagnosis not present

## 2018-09-05 DIAGNOSIS — I119 Hypertensive heart disease without heart failure: Secondary | ICD-10-CM | POA: Diagnosis not present

## 2018-09-05 DIAGNOSIS — G20 Parkinson's disease: Secondary | ICD-10-CM | POA: Diagnosis not present

## 2018-09-05 DIAGNOSIS — F411 Generalized anxiety disorder: Secondary | ICD-10-CM | POA: Diagnosis not present

## 2018-09-07 DIAGNOSIS — I2510 Atherosclerotic heart disease of native coronary artery without angina pectoris: Secondary | ICD-10-CM | POA: Diagnosis not present

## 2018-09-07 DIAGNOSIS — F411 Generalized anxiety disorder: Secondary | ICD-10-CM | POA: Diagnosis not present

## 2018-09-07 DIAGNOSIS — G20 Parkinson's disease: Secondary | ICD-10-CM | POA: Diagnosis not present

## 2018-09-07 DIAGNOSIS — I119 Hypertensive heart disease without heart failure: Secondary | ICD-10-CM | POA: Diagnosis not present

## 2018-09-07 DIAGNOSIS — F3341 Major depressive disorder, recurrent, in partial remission: Secondary | ICD-10-CM | POA: Diagnosis not present

## 2018-09-07 DIAGNOSIS — E782 Mixed hyperlipidemia: Secondary | ICD-10-CM | POA: Diagnosis not present

## 2018-09-13 ENCOUNTER — Other Ambulatory Visit: Payer: Medicare Other | Admitting: Licensed Clinical Social Worker

## 2018-09-13 ENCOUNTER — Other Ambulatory Visit: Payer: Self-pay

## 2018-09-13 DIAGNOSIS — Z515 Encounter for palliative care: Secondary | ICD-10-CM

## 2018-09-13 IMAGING — CR DG CHEST 2V
2 series · 2 of 2 positions shown · non-contrast
Comparison: 06/03/2009

CLINICAL DATA: Pedal edema

EXAM:
CHEST  2 VIEW

[w chest pa]
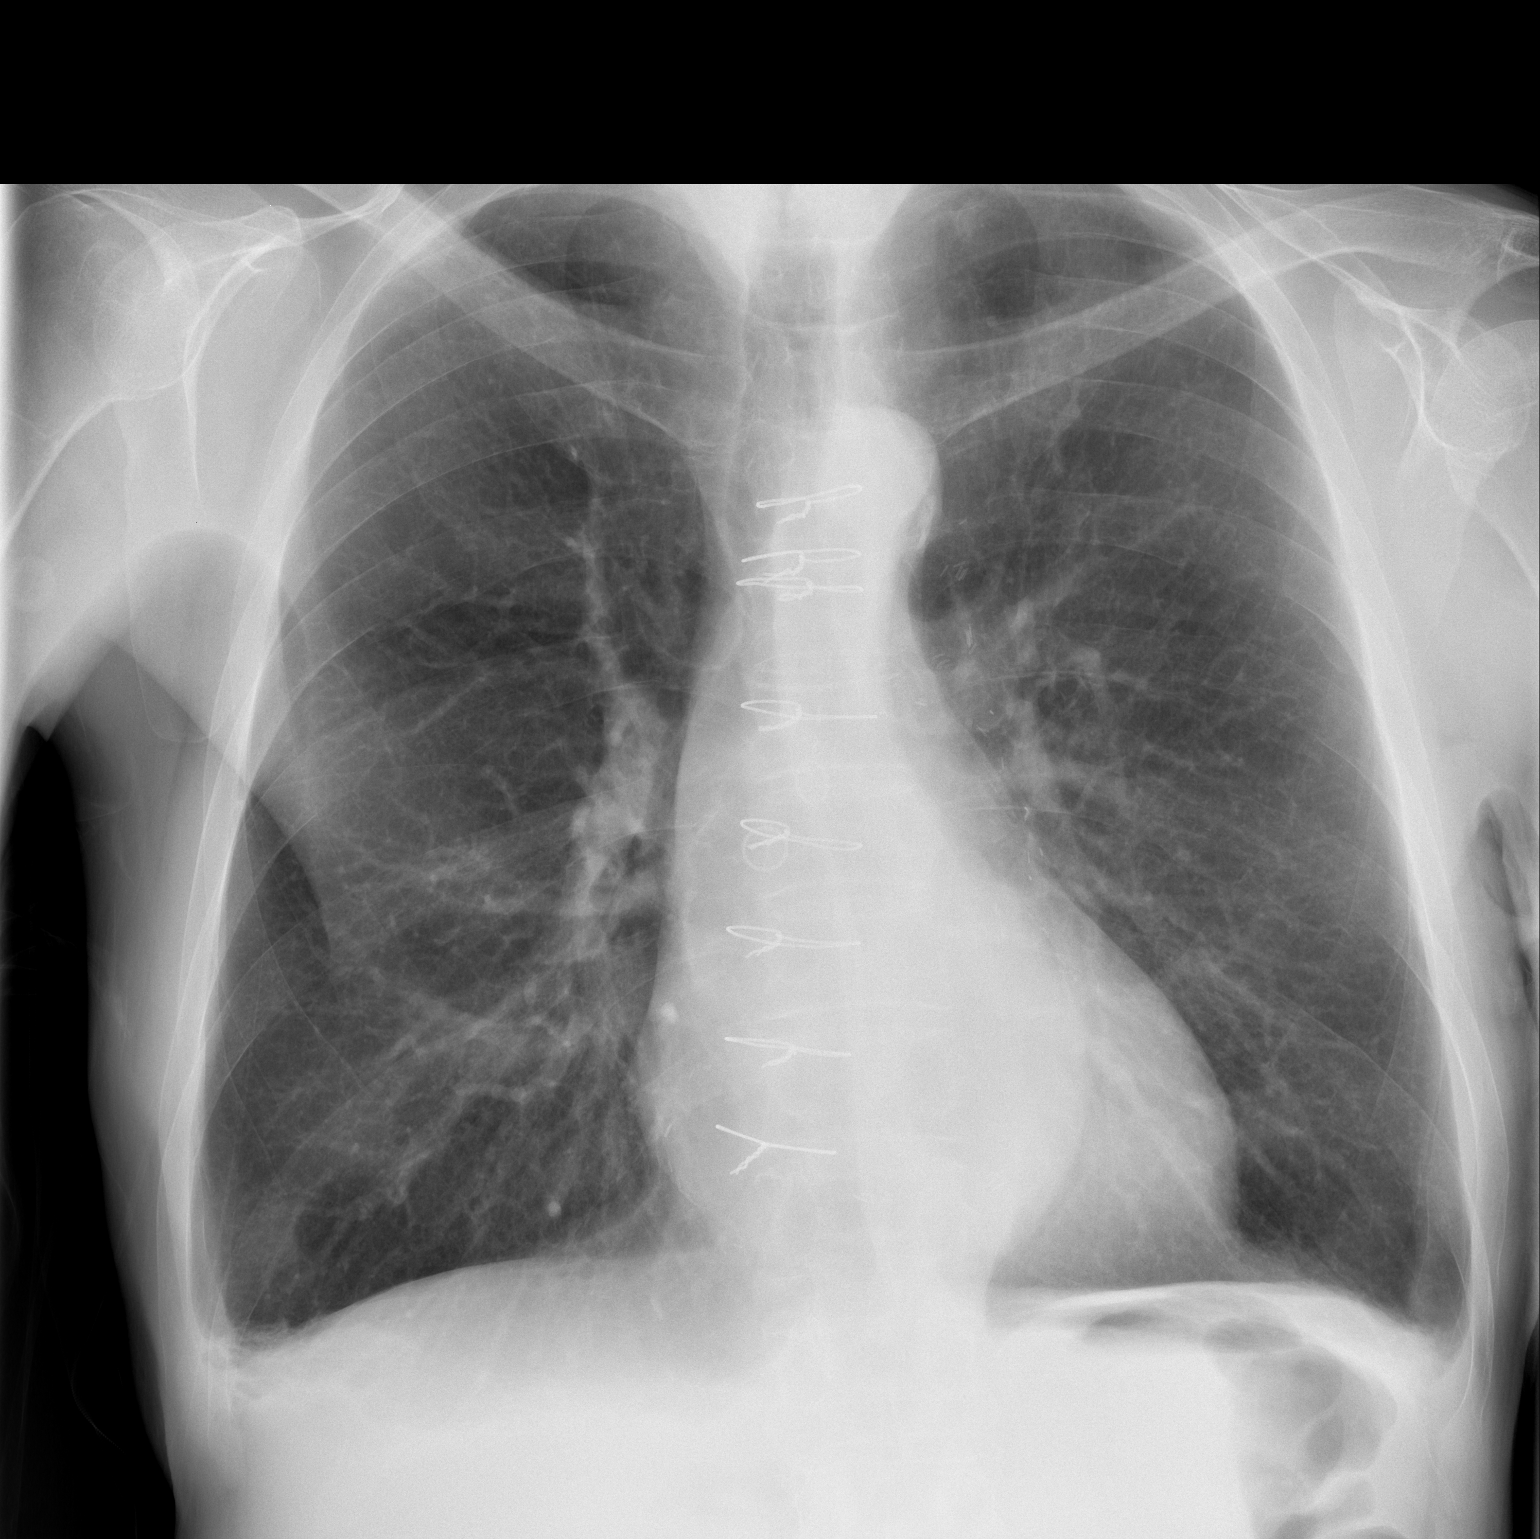

[w chest lat]
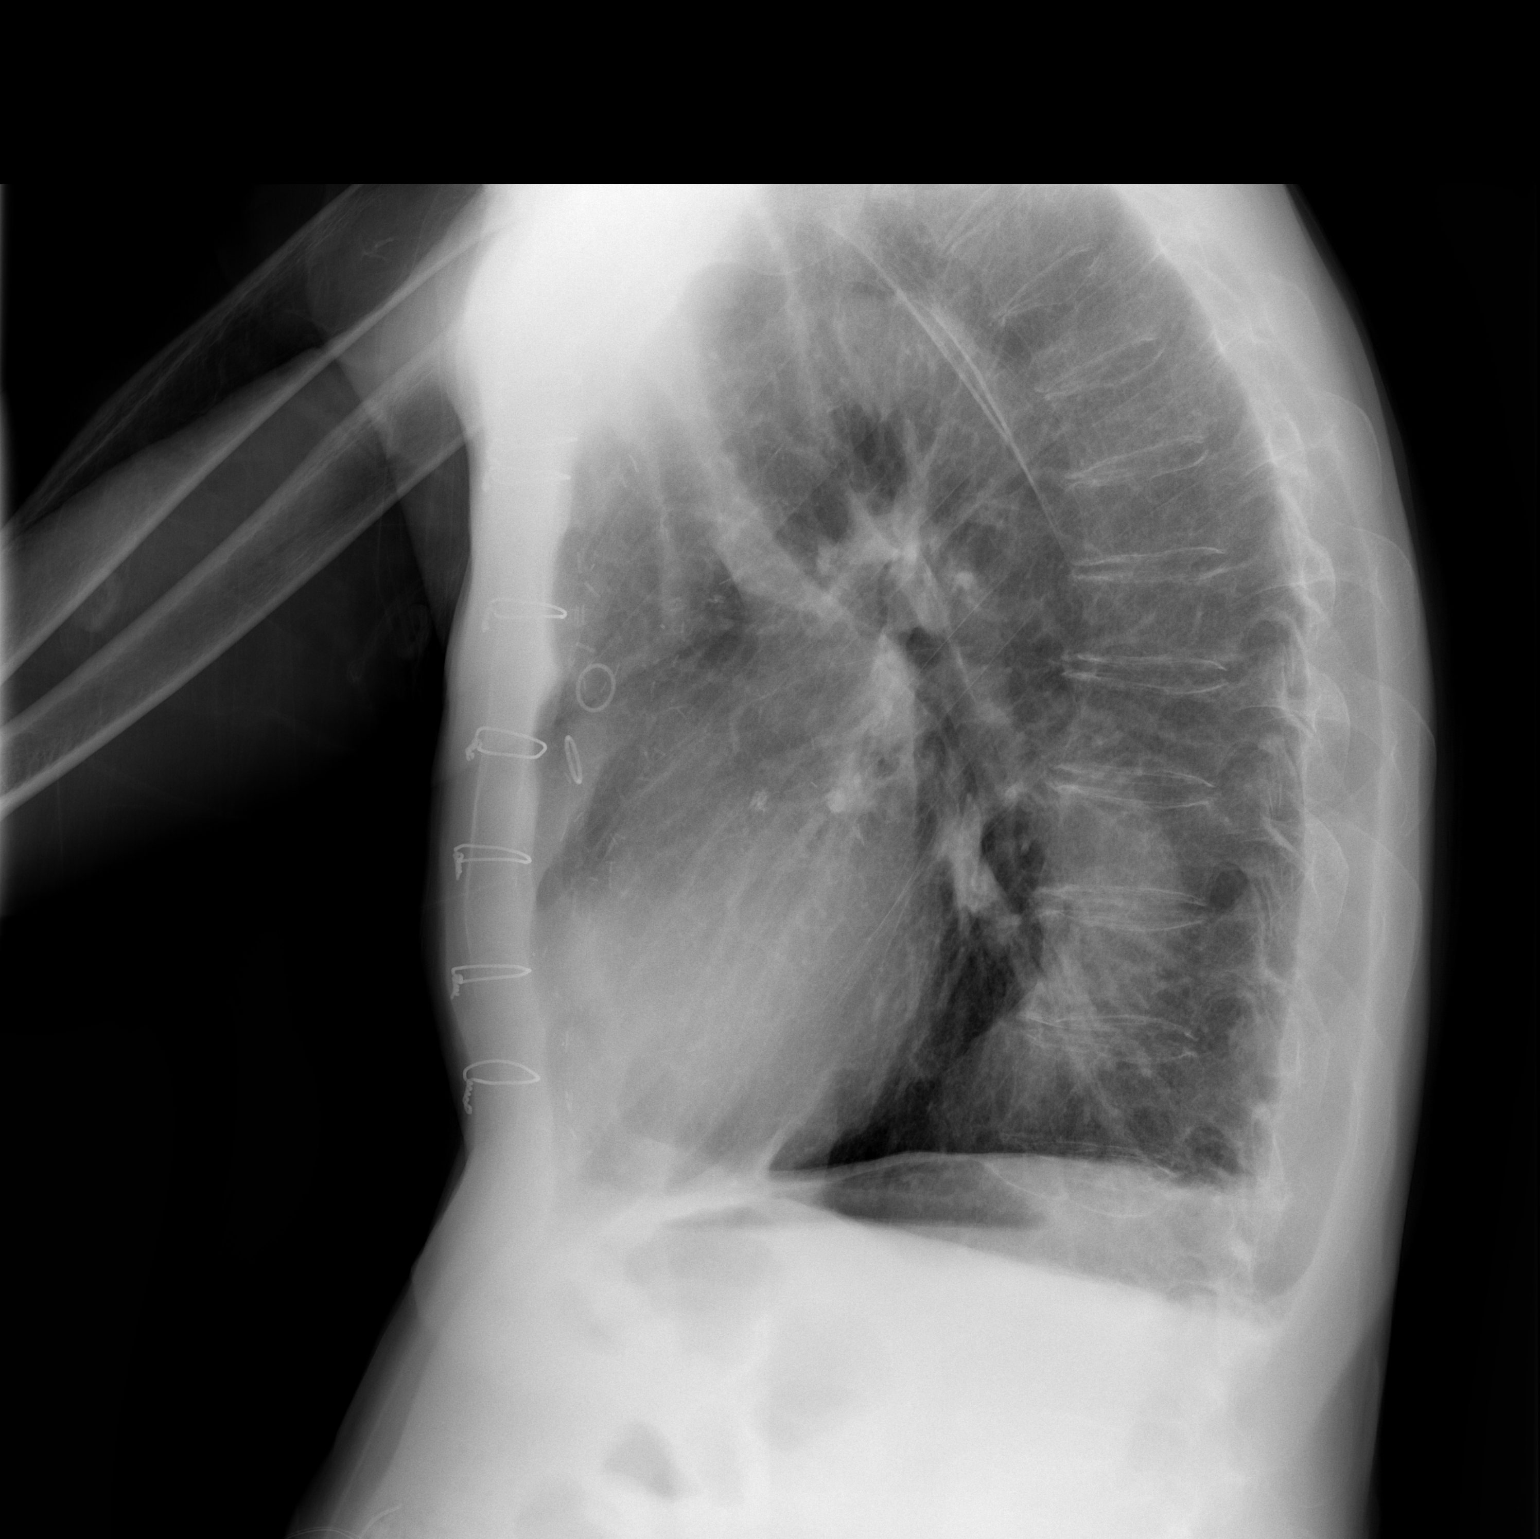

[2 of 2 positions shown; findings below may reference images not displayed]

FINDINGS: COPD with pulmonary hyperinflation. CABG. Negative for heart
failure. No edema or pneumonia. Small bilateral effusions
IMPRESSION: COPD

Small bilateral effusions without heart failure or edema.

## 2018-09-13 NOTE — Progress Notes (Signed)
COMMUNITY PALLIATIVE CARE SW NOTE  PATIENT NAME: Paul Ortiz DOB: October 01, 1945 MRN: [id number]  PRIMARY CARE PROVIDER: Shirline Frees, MD  RESPONSIBLE PARTY:  Acct ID - Guarantor Home Phone Work Phone Relationship Acct Type  [id number] Paul Ortiz, [id number] [phone] Self P/F     [place], [place], [place] [address]   Due to the COVID-19 crisis, this virtual check-in visit was done via telephone from my office and it was initiated and consent given by thispatientand or family.  PLAN OF CARE and INTERVENTIONS:             1. GOALS OF CARE/ ADVANCE CARE PLANNING:  Wife's goal is for patient to move to [facility] SNF when a bed becomes available.  Patient has a DNR. 2. SOCIAL/EMOTIONAL/SPIRITUAL ASSESSMENT/ INTERVENTIONS:  SW conducted a Sales executive visit with patient's wife, Paul Ortiz.  SW provided active listening and supportive counseling while Paul Ortiz discussed patient status.  His sleeping patterns continue to fluctuate.  Patient is mentally clearer after a full nights sleep.  Paul Ortiz reported she was able to obtain a better brief that does not leak, which also allows patient to sleep better at night. 3. PATIENT/CAREGIVER EDUCATION/ COPING:  Patient's wife, Paul Ortiz, expresses her feelings openly. 4. PERSONAL EMERGENCY PLAN:  Paul Ortiz will contact patient's MD. 5. COMMUNITY RESOURCES COORDINATION/ HEALTH CARE NAVIGATION:  The Paul Ortiz provides a CNA three hours a day for five days a week.  She also pays a CNA privately from 9p-5a. 6. FINANCIAL/LEGAL CONCERNS/INTERVENTIONS:  None.     SOCIAL HX:  Social History   Tobacco Use  . Smoking status: Never Smoker  . Smokeless tobacco: Never Used  Substance Use Topics  . Alcohol use: No    CODE STATUS:  DNR  ADVANCED DIRECTIVES:  LW, HCPOA MOST FORM COMPLETE:  N HOSPICE EDUCATION PROVIDED: N PPS:  Patient's appetite is normal.  He uses a walker to ambulate. Duration of visit and documentation:  45 minutes.      Creola Corn Roni Friberg, LCSW

## 2018-09-25 ENCOUNTER — Other Ambulatory Visit: Payer: Medicare Other | Admitting: Licensed Clinical Social Worker

## 2018-09-25 DIAGNOSIS — Z515 Encounter for palliative care: Secondary | ICD-10-CM

## 2018-09-26 ENCOUNTER — Other Ambulatory Visit: Payer: Medicare Other | Admitting: *Deleted

## 2018-09-26 ENCOUNTER — Other Ambulatory Visit: Payer: Self-pay

## 2018-09-26 DIAGNOSIS — Z515 Encounter for palliative care: Secondary | ICD-10-CM

## 2018-09-26 NOTE — Progress Notes (Signed)
COMMUNITY PALLIATIVE CARE SW NOTE  PATIENT NAME: Paul Ortiz DOB: [birth year] MRN: [id number]  PRIMARY CARE PROVIDER: Shirline Frees, MD  RESPONSIBLE PARTY:  Acct ID - Guarantor Home Phone Work Phone Relationship Acct Type  [id number] JAMMIE, [phone] Self P/F     [place], [place], [place] [address]   Due to the COVID-19 crisis, this virtual check-in visit was done via telephone from my office and it was initiated and consent given by this patient and or family.  PLAN OF CARE and INTERVENTIONS:             1. GOALS OF CARE/ ADVANCE CARE PLANNING:  Goal is for patient to be admitted to [facility] SNF when there is an opening.  He is a DNR. 2. SOCIAL/EMOTIONAL/SPIRITUAL ASSESSMENT/ INTERVENTIONS:  SW conducted a virtual check-in visit per wife, [name]'s, request.  She had several questions about patient's eligibility for Hospice.  She reports he is having increased hallucinations of snakes.  Wife also wanted to know if Agent Haig Prophet remains in patient's system from the [place] War.  SW encouraged she contact patient's VA MD for information.  SW also informed Palliative Care RN regarding [name]'s questions about eligibility. 3. PATIENT/CAREGIVER EDUCATION/ COPING:  SW provided education regarding Palliative Care. 4. PERSONAL EMERGENCY PLAN:  Wife will contact patient's MD. 5. COMMUNITY RESOURCES COORDINATION/ HEALTH CARE NAVIGATION:  He has CNAs 3 hours a day from the [place] and a private CNA from 9p-5a. 6. FINANCIAL/LEGAL CONCERNS/INTERVENTIONS:  None.     SOCIAL HX:  Social History   Tobacco Use  . Smoking status: Never Smoker  . Smokeless tobacco: Never Used  Substance Use Topics  . Alcohol use: No    CODE STATUS:  DNR ADVANCED DIRECTIVES: LW, HCPOA MOST FORM COMPLETE:  N HOSPICE EDUCATION PROVIDED:  Y PPS:  Appetite is normal.  He ambulates with a walker. Duration of visit and documentation:  45 minutes.      Creola Corn Loxley Schmale, LCSW

## 2018-09-27 ENCOUNTER — Telehealth: Payer: Self-pay | Admitting: *Deleted

## 2018-09-27 NOTE — Telephone Encounter (Signed)
Contacted and spoke with patient's wife, Paul Ortiz, regarding hospice eligibility as she requested in virtual visit yesterday. I informed her that I spoke with our Palliative NP, who feels that based on the report given that patient is eligible for hospice services. Wife has talked this over with her daughter and has decided to remain on Palliative care services d/t wanting to pursue Physical Therapy any time they feel this is warranted. She would like for me to contact her next week to schedule an in-home visit with patient.

## 2018-09-27 NOTE — Progress Notes (Signed)
COMMUNITY PALLIATIVE CARE RN NOTE  PATIENT NAME: Paul Ortiz DOB: [birth year] MRN: [id number]  PRIMARY CARE PROVIDER: Shirline Frees, MD  RESPONSIBLE PARTY:  Acct ID - Guarantor Home Phone Work Phone Relationship Acct Type  [id number] DEMETRUIS, [phone] Self P/F     [place], [place], [place] [address]   Due to the COVID-19 crisis, this virtual check-in visit was done via telephone from my office and it was initiated and consent by this patient and or family.  PLAN OF CARE and INTERVENTION:  1. ADVANCE CARE PLANNING/GOALS OF CARE: Goal is for patient to go to [facility] SNF when bed is available. He is a DNR. 2. PATIENT/CAREGIVER EDUCATION: Signs of disease progression and hospice eligibility 3. DISEASE STATUS: Virtual check-in visit completed via telephone. Received message from Palliative care SW stating that patient's wife, Paul Ortiz, had questions as to what would make patient eligible for hospice services. Wife reports that patient is experiencing visual hallucinations. Last week, he was seeing snakes around his feet and crawling in his bed. He says that he can feel them crawling on him. This is very distressing to patient. He has been off of his Neupro patch for several weeks, which she was told that this patch could have been contributing to some of his confusion and hallucinations. Later in the evenings there are times he is asking who are all the people there in his home. She states that his speech is more mumbled and she is having difficulties understanding what he is saying. His voice also is weaker and difficult to hear. He now requires 1 person assistance with standing, ambulation and is now total care for dressing, bathing and feeding. This higher level of assistance started about 2 months ago. Progressive generalized weakness. Often times he is leaning forward and having difficulties straightening his legs out to stand upright. He is having more difficulties  walking and was unable to walk back from the bathroom to the recliner today which is nearby. He has had 5 falls recently, one resulting in a bruise to his elbow. No fractures. His intake is poor- fair. She says that she is noticing that his pants are fitting more loosely. He has poor fluid intake and urine is dark in appearance. Last two urinalysis and cultures were negative for UTI. He requires 24/7 supervision. She has hired caregivers through the [place] 5 days/week from 9a-12p and private caregivers from 9p to [place]. She is with patient by herself from 12a-9p and states that she is having health issues herself and caring for him is getting more difficult. She needs more help during those times. She would like for patient to go to the SNF at [facility] in [facility], [place] but the waiting list is very long. Explained hospice services and what is provided. I advised that I will speak with our Palliative NP to inquire about hospice eligibility. Wife to talk this over with her daughter to see how she feels about receiving hospice care if patient is eligible. Her main concern is being able to receive PT when Medicare allows, which would not be covered under hospice care. Will call wife tomorrow after speaking with NP about hospice eligibility. Will continue to monitor.   HISTORY OF PRESENT ILLNESS:  This is a 73 yo male who resides at home with his wife. Palliative care team continues to follow patient. Will continue to visit monthly and PRN.   CODE STATUS: DNR ADVANCED DIRECTIVES: Y MOST FORM: no PPS: 40%   (Duration  of visit and documentation 60 minutes)   Daryl Eastern, RN BSN

## 2018-09-29 ENCOUNTER — Other Ambulatory Visit: Payer: Self-pay | Admitting: *Deleted

## 2018-09-29 DIAGNOSIS — R6889 Other general symptoms and signs: Secondary | ICD-10-CM | POA: Diagnosis not present

## 2018-09-29 DIAGNOSIS — Z20822 Contact with and (suspected) exposure to covid-19: Secondary | ICD-10-CM

## 2018-10-01 LAB — NOVEL CORONAVIRUS, NAA: SARS-CoV-2, NAA: DETECTED — AB

## 2018-10-03 ENCOUNTER — Other Ambulatory Visit: Payer: Medicare Other | Admitting: *Deleted

## 2018-10-03 ENCOUNTER — Other Ambulatory Visit: Payer: Self-pay

## 2018-10-03 DIAGNOSIS — Z515 Encounter for palliative care: Secondary | ICD-10-CM

## 2018-10-04 NOTE — Progress Notes (Signed)
COMMUNITY PALLIATIVE CARE RN NOTE  PATIENT NAME: Paul Ortiz DOB: 1945-09-19 MRN: [id number]  PRIMARY CARE PROVIDER: Shirline Frees, MD  RESPONSIBLE PARTY: Linward Natal (wife) Acct ID - Guarantor Home Phone Work Phone Relationship Acct Type  [id number] TIMOTH, [phone] Self P/F     [place], [place], [place] [address]   Due to the COVID-19 crisis, this virtual check-in visit was done via telephone from my office and it was initiated and consent by this patient and or family.  PLAN OF CARE and INTERVENTION:  1. ADVANCE CARE PLANNING/GOALS OF CARE: Goal is for patient to get stronger to improve ambulation and endurance. He is a DNR.  2. PATIENT/CAREGIVER EDUCATION: Safe Mobility/Transfers 3. DISEASE STATUS: Virtual check-in visit completed via telephone. Patient currently sitting up in his recliner beside his wife. He denies pain at this time. Wife reports that both her and husband have tested positive for Covid-19 on 09/29/18. They contracted it from their nighttime private caregiver. Patient has been afebrile. Wife says she has been giving him Tylenol, alternating with Ibuprofen. Denies any shortness of breath. He does have an occasional dry cough, but nothing excessive. He continues with progressive generalized weakness with standing and ambulation. He ambulates using a walker, but at times has to be transported via wheelchair. He is a high fall risk. His oral intake remains poor-fair. Patient has a telehealth visit with his PCPs office this evening. Will continue to monitor.                                                                                          HISTORY OF PRESENT ILLNESS:  This is a 73 yo male who resides at home with his wife. Palliative care team continues to follow patient. Will continue with monthly and PRN visits.  CODE STATUS: DNR ADVANCED DIRECTIVES: Y MOST FORM: no PPS: 40%   (Duration of visit and documentation 30  minutes)   Daryl Eastern, RN BSN

## 2018-10-05 DIAGNOSIS — E782 Mixed hyperlipidemia: Secondary | ICD-10-CM | POA: Diagnosis not present

## 2018-10-05 DIAGNOSIS — I10 Essential (primary) hypertension: Secondary | ICD-10-CM | POA: Diagnosis not present

## 2018-10-05 DIAGNOSIS — G20 Parkinson's disease: Secondary | ICD-10-CM | POA: Diagnosis not present

## 2018-10-05 DIAGNOSIS — U071 COVID-19: Secondary | ICD-10-CM | POA: Diagnosis not present

## 2018-10-10 ENCOUNTER — Other Ambulatory Visit: Payer: Self-pay

## 2018-10-10 DIAGNOSIS — R6889 Other general symptoms and signs: Secondary | ICD-10-CM | POA: Diagnosis not present

## 2018-10-10 DIAGNOSIS — Z20822 Contact with and (suspected) exposure to covid-19: Secondary | ICD-10-CM

## 2018-10-12 LAB — NOVEL CORONAVIRUS, NAA: SARS-CoV-2, NAA: DETECTED — AB

## 2018-10-16 ENCOUNTER — Other Ambulatory Visit: Payer: Self-pay

## 2018-10-16 DIAGNOSIS — Z20822 Contact with and (suspected) exposure to covid-19: Secondary | ICD-10-CM

## 2018-10-16 NOTE — Progress Notes (Signed)
Virtual Visit via Video Note The purpose of this virtual visit is to provide medical care while limiting exposure to the novel coronavirus.  Pt currently with Covid and trying to limit spread to community/staff.  Pt in quarantine at home.  Consent was obtained for video visit:  Yes.   Answered questions that patient had about telehealth interaction:  Yes.   I discussed the limitations, risks, security and privacy concerns of performing an evaluation and management service by telemedicine. I also discussed with the patient that there may be a patient responsible charge related to this service. The patient expressed understanding and agreed to proceed.  Pt location: Home Physician Location: office Name of referring provider:  Shirline Frees, MD I connected with Paul Ortiz at patients initiation/request on 10/19/2018 at  3:00 PM EDT by video enabled telemedicine application and verified that I am speaking with the correct person using two identifiers. Pt MRN:  [id number] Pt DOB:  1945-04-28 Video Participants:  Paul Ortiz; wife   Paul Ortiz was seen today in follow up for Parkinsons disease.  He is accompanied by his wife who supplements the history.  Numerous records are also reviewed.  This was intended to be an in person visit but pt found to be recently positive for Covid-19 and is in quarantine. Pt is currently on carbidopa/levodopa 25/250, 1 tablet 3 times per day and carbidopa/levodopa 50/200 CR at bedtime. Wife asks if increasing the levodopa could help with hallucinations.   Last time, I expressed concern about his rotigotine patch, stating that I thought that it was potentially contributing to confusion and hallucinations.  I recommended starting to wean that medication.  I have received numerous phone calls from the patient's spouse since that visit.  On July 2, the patient's wife stated that the patient was confused and hallucinating.  The patient's wife stated that she  tried to reduce the patch on one occasion, and the patient complained of leg pain so she stopped reducing the patch and went back to prior dosing.  I also asked him to contact the primary care physician (which I had asked him to do in May) about the fact that he was still on gabapentin, as I thought that could contribute to confusion and hallucinations.  The primary care physician did stop this and they noted benefit in confusion and hallucinations after discontinuing it per notes, but wife states today he is back on it for leg pain and "it is helping."   In regards to his leg pain, I did offer a referral to pain management.  They declined that.  I got a call on July 21 that the patient was aggressive, throwing things at her and yelling.  Again, I recommended stopping the Neupro patch.  Pts wife stated that she needed to talk to her daughter about this before she proceeded with discontinuing it.  They did call on July 30 stating that they had stopped the patch, and overall the patient had been doing better, but he was still having restless nights.  Quetiapine was started/restarted as he had been on this in the past.  He was told to only take 12.5 mg at bedtime initially, but to call us if he had future problems or if this was not a high enough dose.  Wife states that he is taking 25 mg at night and he is doing better with that.  Last week he did have some hallucinations of snakes on him and worms.  This  is bit better this week.  Wife states that he is getting confused about 4pm and saying "where are we going" and "I want to stay with you."  Asks if there is something we can do about that.   Patient has been seen by palliative care.  Those notes are reviewed.  Patient has tested positive for COVID on September 29, 2018.  He had a COVID test on October 10, 2018 that was positive.  He had to stop PT because of that and got slower per wife.  They are coming to reassess him tomorrow   PREVIOUS MEDICATIONS:  duopa tube  (had it placed and subsequently reversed);selegiline (lightheadedness) pramipexole (stated side effects but did not state what the side effects were); aricept (made hallucinations worse)  ALLERGIES:   Allergies  Allergen Reactions   Lactose Intolerance (Gi) Diarrhea   Penicillins Rash    CURRENT MEDICATIONS:  Outpatient Encounter Medications as of 10/19/2018  Medication Sig   acetaminophen (TYLENOL) 500 MG tablet Take 1,000 mg by mouth every 6 (six) hours as needed. Alternating with 2 (ibuprofen 200mg  ) every 3 hours at meal times and Bedtime.   ALPRAZolam (XANAX) 1 MG tablet Take 1 mg by mouth at bedtime.   aspirin 325 MG tablet Take 325 mg by mouth daily.   carbidopa-levodopa (SINEMET CR) 50-200 MG tablet Take 1 tablet by mouth at bedtime.   carbidopa-levodopa (SINEMET IR) 25-250 MG tablet Take 1 tablet by mouth 3 (three) times daily.   Cholecalciferol (VITAMIN D-1000 MAX ST) 1000 units tablet Take 1 tablet by mouth daily.   furosemide (LASIX) 20 MG tablet Take 10 mg by mouth daily. Take half tablet (10mg ) once daily   gabapentin (NEURONTIN) 100 MG capsule Take 200 mg by mouth at bedtime.    ibuprofen (ADVIL) 200 MG tablet Take 400 mg by mouth every 6 (six) hours as needed for moderate pain (rotates with tylenol every 3 hours at meal times and bedtime). Take 2 tab daily    ipratropium (ATROVENT) 0.06 % nasal spray Place 2 sprays into the nose as needed.   Magnesium 500 MG CAPS Take 250 mg by mouth daily. Taking at noon   metoprolol succinate (TOPROL-XL) 25 MG 24 hr tablet Take 12.5 mg by mouth 2 (two) times daily. Half in am and half at night   Omega-3 Fatty Acids (FISH OIL) 1000 MG CAPS Take 2,000 mg by mouth daily.    ondansetron (ZOFRAN) 8 MG tablet Take 8 mg by mouth every 8 (eight) hours as needed for nausea.    potassium chloride (K-DUR) 10 MEQ tablet Take 5 mEq by mouth daily.    Propylene Glycol-Glycerin (SOOTHE) 0.6-0.6 % SOLN Place 1 drop into both eyes daily  as needed (dry eyes).   QUEtiapine (SEROQUEL) 25 MG tablet Take 0.5 tablets (12.5 mg total) by mouth at bedtime. (Patient taking differently: Take 25 mg by mouth at bedtime. )   Rotigotine 3 MG/24HR PT24 Place 1 patch onto the skin daily.   tamsulosin (FLOMAX) 0.4 MG CAPS capsule Take 0.4 mg by mouth daily.   vitamin B-12 (CYANOCOBALAMIN) 1000 MCG tablet Take 1,000 mcg by mouth daily.   [DISCONTINUED] ALPRAZolam (XANAX XR) 1 MG 24 hr tablet 1 TABLET ONCE A DAY AT BEDTIME BY MOUTH (30 DAY SUPPLY)(MUST LAST 30 DAYS)   No facility-administered encounter medications on file as of 10/19/2018.     PAST MEDICAL HISTORY:   Past Medical History:  Diagnosis Date   Abdominal wall hernia  BPH with obstruction/lower urinary tract symptoms    Central sleep apnea    Coronary atherosclerosis of native coronary artery    Hypercholesteremia    Hypertension    Insomnia    Major depression    MI (myocardial infarction) ([place]) 2005   Mixed hyperlipidemia    OSA (obstructive sleep apnea)    Parkinson disease (HCC)    Vasomotor rhinitis     PAST SURGICAL HISTORY:   Past Surgical History:  Procedure Laterality Date   CARDIAC SURGERY     CATARACT EXTRACTION, BILATERAL Bilateral    CORONARY ARTERY BYPASS GRAFT  2005   PEG placement     and reversal for duopa tube    SOCIAL HISTORY:   Social History   Socioeconomic History   Marital status: Married    Spouse name: Not on file   Number of children: 2   Years of education: Not on file   Highest education level: High school graduate  Occupational History   Occupation: retired    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, product/process development strain: Not on file   Food insecurity    Worry: Not on file    Inability: Not on Lexicographer needs    Medical: Not on file    Non-medical: Not on file  Tobacco Use   Smoking status: Never Smoker   Smokeless tobacco: Never Used  Substance and Sexual Activity     Alcohol use: No   Drug use: No   Sexual activity: Never  Lifestyle   Physical activity    Days per week: Not on file    Minutes per session: Not on file   Stress: Not on file  Relationships   Social connections    Talks on phone: Not on file    Gets together: Not on file    Attends religious service: Not on file    Active member of club or organization: Not on file    Attends meetings of clubs or organizations: Not on file    Relationship status: Not on file   Intimate partner violence    Fear of current or ex partner: Not on file    Emotionally abused: Not on file    Physically abused: Not on file    Forced sexual activity: Not on file  Other Topics Concern   Not on file  Social History Narrative   Not on file    FAMILY HISTORY:   Family Status  Relation Name Status   MGM  Deceased   MGF  Deceased   PGM  Deceased   PGF  Deceased   Mother  Deceased   Father  Deceased   Sister  Deceased   Daughter 2 Alive    ROS:  Review of Systems  Unable to perform ROS: Dementia    PHYSICAL EXAMINATION:    VITALS:   Vitals:   10/19/18 1355  Weight: 165 lb (74.8 kg)  Height: 6' (1.829 m)    GEN:  The patient appears stated age and is in NAD. HEENT:  Normocephalic, atraumatic.    Neurological examination:  Orientation: The patient is alert and oriented to person.  More confused today than last visit. Cranial nerves: There is good facial symmetry with facial hypomimia. The speech is fluent and clear but lacks spontaneity. Soft palate rises symmetrically and there is no tongue deviation. Hearing is intact to conversational tone. Sensation: Sensation is intact to light touch throughout Motor: Strength is at least antigravity  x4.  Movement examination: Tone: unable Abnormal movements: none seen Coordination:  There is slowness/apraxia with RAMs but not necessarily decremation Gait and Station: The patient needs assist out of the chair.  He is slow but  able to walk with the walker.  Needs verbal coaching from his wife   ASSESSMENT/PLAN:  1.  Parkinson's disease, diagnosed in 2011             -Patient's Parkinson's disease has now been complicated by orthostatic hypotension, wearing off, confusion/delusions/hallucinations             -We discussed the diagnosis as well as pathophysiology of the disease.  We discussed treatment options as well as prognostic indicators.  Patient education was provided.             -We discussed that it used to be thought that levodopa would increase risk of melanoma but now it is believed that Parkinsons itself likely increases risk of melanoma. he is to get regular skin checks.             -  We talked about safety in the home.             -We decided to continue the carbidopa/levodopa 25/250, 1 tablet 3 times per day.  Wife asks about potentially increasing this.  She thought it would help the hallucinations.  I told her that this would only make things worse.             -We decided to continue the carbidopa/levodopa 50/200 CR at bedtime             -now off of neupro due to confusion/hallucinations             -Patient has had a duopa tube placed, but did not care for that and had that reversed             -We discussed community resources in the area including patient support groups and community exercise programs for PD and pt education was provided to the patient.  Encouraged the patient's wife to attend the caregiver support group.  -Patient getting ready to restart physical therapy.  We did discuss that Jaskirat can dramatically affect efficacy of physical therapy.         2.  Parkinson's disease dementia with confusion/hallucinations             -I think that perhaps the gabapentin has increased hallucinations.  They will talk with primary care provider about that.  He is doing better with the Seroquel at night, however.             -on quetiapine, 25 mg at bedtime.  We will add an additional 12.5 mg at  3:30 PM.  Wife is to let me know how he does within the next 2 weeks.  We may need to increase this further.  We discussed extensively risk, benefits, and side effects including but not limited to the black box warning.             -Patient asked me about his Xanax.  States that he has tried to get off of it multiple times.  Discussed with him that this is obviously not something that I want him on as it can cause confusion, falls and hallucinations, but we are changing multiple other things and I would not recommend changing that right this second.  This is not a medication that I prescribe.  3.  Neurogenic orthostatic hypotension             -  Patient was under the impression that when he feels lightheaded that this is a blood sugar problem.  I suspect that this is just orthostasis, as was previously identified in his [facility] records as well.  He needs to increase his water intake.  4.  Intermittent diplopia             -Patient has seen neuro-ophthalmology a few times at [facility], with no changes between 2015 and 2020.  Patient has had identified a relative small afferent pupillary defect on the left as well as a left ankle hyperphoria, for which prisms have been recommended   5.  Plan to see the patient back in the office in the next 5 to 6 months, sooner should new neurologic issues arise.  Much greater than 50% of this visit was spent in counseling and coordinating care.  Total face to face time:  25 min  Cc:  Shirline Frees, MD

## 2018-10-17 LAB — NOVEL CORONAVIRUS, NAA: SARS-CoV-2, NAA: NOT DETECTED

## 2018-10-19 ENCOUNTER — Telehealth: Payer: Self-pay | Admitting: Neurology

## 2018-10-19 ENCOUNTER — Telehealth (INDEPENDENT_AMBULATORY_CARE_PROVIDER_SITE_OTHER): Payer: Medicare Other | Admitting: Neurology

## 2018-10-19 ENCOUNTER — Other Ambulatory Visit: Payer: Self-pay

## 2018-10-19 ENCOUNTER — Encounter: Payer: Self-pay | Admitting: Neurology

## 2018-10-19 VITALS — Ht 72.0 in | Wt 165.0 lb

## 2018-10-19 DIAGNOSIS — F0281 Dementia in other diseases classified elsewhere with behavioral disturbance: Secondary | ICD-10-CM

## 2018-10-19 DIAGNOSIS — G20 Parkinson's disease: Secondary | ICD-10-CM | POA: Diagnosis not present

## 2018-10-19 NOTE — Telephone Encounter (Signed)
Wife is calling in about forgetting to ask about the genetic for seroquel. She was asking about another form of it. The prescription is a tiny pill and is supposed to be given half at a time. It's hard to cut that little pill in half. Sometimes it just crushes. Thanks!

## 2018-10-19 NOTE — Telephone Encounter (Signed)
25 mg is the smallest dose so I can't give her a 12.5 mg so that is why I told her to try and cut it in half.  Use pill cutter.  If it doesn't work we can try using the 25 mg.

## 2018-10-19 NOTE — Telephone Encounter (Signed)
Pt was seen via virtual visit today Was this discussed?

## 2018-10-20 DIAGNOSIS — E559 Vitamin D deficiency, unspecified: Secondary | ICD-10-CM | POA: Diagnosis not present

## 2018-10-20 DIAGNOSIS — G20 Parkinson's disease: Secondary | ICD-10-CM | POA: Diagnosis not present

## 2018-10-20 DIAGNOSIS — E782 Mixed hyperlipidemia: Secondary | ICD-10-CM | POA: Diagnosis not present

## 2018-10-20 DIAGNOSIS — G4700 Insomnia, unspecified: Secondary | ICD-10-CM | POA: Diagnosis not present

## 2018-10-20 DIAGNOSIS — M545 Low back pain: Secondary | ICD-10-CM | POA: Diagnosis not present

## 2018-10-20 DIAGNOSIS — N400 Enlarged prostate without lower urinary tract symptoms: Secondary | ICD-10-CM | POA: Diagnosis not present

## 2018-10-20 DIAGNOSIS — I10 Essential (primary) hypertension: Secondary | ICD-10-CM | POA: Diagnosis not present

## 2018-10-20 DIAGNOSIS — J301 Allergic rhinitis due to pollen: Secondary | ICD-10-CM | POA: Diagnosis not present

## 2018-10-20 DIAGNOSIS — F411 Generalized anxiety disorder: Secondary | ICD-10-CM | POA: Diagnosis not present

## 2018-10-20 DIAGNOSIS — G2581 Restless legs syndrome: Secondary | ICD-10-CM | POA: Diagnosis not present

## 2018-10-20 DIAGNOSIS — I252 Old myocardial infarction: Secondary | ICD-10-CM | POA: Diagnosis not present

## 2018-10-20 DIAGNOSIS — F3341 Major depressive disorder, recurrent, in partial remission: Secondary | ICD-10-CM | POA: Diagnosis not present

## 2018-10-20 DIAGNOSIS — G4733 Obstructive sleep apnea (adult) (pediatric): Secondary | ICD-10-CM | POA: Diagnosis not present

## 2018-10-20 DIAGNOSIS — Z8619 Personal history of other infectious and parasitic diseases: Secondary | ICD-10-CM | POA: Diagnosis not present

## 2018-10-20 DIAGNOSIS — I2510 Atherosclerotic heart disease of native coronary artery without angina pectoris: Secondary | ICD-10-CM | POA: Diagnosis not present

## 2018-10-20 NOTE — Telephone Encounter (Signed)
Called patient spouse Paul Ortiz she was informed of provider response she is aware and understands. She does have a pill cutter. She will continue with the 1/2 tablet and call back in 2 weeks with update

## 2018-10-24 DIAGNOSIS — I2510 Atherosclerotic heart disease of native coronary artery without angina pectoris: Secondary | ICD-10-CM | POA: Diagnosis not present

## 2018-10-24 DIAGNOSIS — G4733 Obstructive sleep apnea (adult) (pediatric): Secondary | ICD-10-CM | POA: Diagnosis not present

## 2018-10-24 DIAGNOSIS — N400 Enlarged prostate without lower urinary tract symptoms: Secondary | ICD-10-CM | POA: Diagnosis not present

## 2018-10-24 DIAGNOSIS — G20 Parkinson's disease: Secondary | ICD-10-CM | POA: Diagnosis not present

## 2018-10-24 DIAGNOSIS — E559 Vitamin D deficiency, unspecified: Secondary | ICD-10-CM | POA: Diagnosis not present

## 2018-10-24 DIAGNOSIS — I10 Essential (primary) hypertension: Secondary | ICD-10-CM | POA: Diagnosis not present

## 2018-10-26 DIAGNOSIS — I2510 Atherosclerotic heart disease of native coronary artery without angina pectoris: Secondary | ICD-10-CM | POA: Diagnosis not present

## 2018-10-26 DIAGNOSIS — I10 Essential (primary) hypertension: Secondary | ICD-10-CM | POA: Diagnosis not present

## 2018-10-26 DIAGNOSIS — G20 Parkinson's disease: Secondary | ICD-10-CM | POA: Diagnosis not present

## 2018-10-26 DIAGNOSIS — G4733 Obstructive sleep apnea (adult) (pediatric): Secondary | ICD-10-CM | POA: Diagnosis not present

## 2018-10-26 DIAGNOSIS — N400 Enlarged prostate without lower urinary tract symptoms: Secondary | ICD-10-CM | POA: Diagnosis not present

## 2018-10-26 DIAGNOSIS — E559 Vitamin D deficiency, unspecified: Secondary | ICD-10-CM | POA: Diagnosis not present

## 2018-10-30 ENCOUNTER — Telehealth: Payer: Self-pay | Admitting: Neurology

## 2018-10-30 MED ORDER — QUETIAPINE FUMARATE 25 MG PO TABS: ORAL_TABLET | ORAL | 1 refills | Status: DC

## 2018-10-30 NOTE — Telephone Encounter (Signed)
Spoke with spouse this is correct new rx sent   Spouse aware

## 2018-10-30 NOTE — Telephone Encounter (Signed)
See below

## 2018-10-30 NOTE — Telephone Encounter (Signed)
She should be doing seroquel, 25mg , 1/2 tablet at 3:30 pm and 1 at bedtime.  If that is correct, have her increase to 1 tablet at 3:30 pm and 2 at bedtime.  May update the RX

## 2018-10-30 NOTE — Telephone Encounter (Signed)
Patient's wife, Paul Ortiz, called and said she needs to give a report on the patient trying Seroquel earlier in the day. She said she's tried giving it to the patient earlier in the day and it helped a little at first but now is not helping at all. She said he is having a hard time with hallucinations and not getting much sleep either.   She said she is needing some advice on how best to care for her husband and that she feels that it is getting to be too much for her.  [facility]

## 2018-10-31 DIAGNOSIS — I10 Essential (primary) hypertension: Secondary | ICD-10-CM | POA: Diagnosis not present

## 2018-10-31 DIAGNOSIS — I2510 Atherosclerotic heart disease of native coronary artery without angina pectoris: Secondary | ICD-10-CM | POA: Diagnosis not present

## 2018-10-31 DIAGNOSIS — N400 Enlarged prostate without lower urinary tract symptoms: Secondary | ICD-10-CM | POA: Diagnosis not present

## 2018-10-31 DIAGNOSIS — G4733 Obstructive sleep apnea (adult) (pediatric): Secondary | ICD-10-CM | POA: Diagnosis not present

## 2018-10-31 DIAGNOSIS — G20 Parkinson's disease: Secondary | ICD-10-CM | POA: Diagnosis not present

## 2018-10-31 DIAGNOSIS — E559 Vitamin D deficiency, unspecified: Secondary | ICD-10-CM | POA: Diagnosis not present

## 2018-11-02 DIAGNOSIS — N400 Enlarged prostate without lower urinary tract symptoms: Secondary | ICD-10-CM | POA: Diagnosis not present

## 2018-11-02 DIAGNOSIS — G20 Parkinson's disease: Secondary | ICD-10-CM | POA: Diagnosis not present

## 2018-11-02 DIAGNOSIS — I2510 Atherosclerotic heart disease of native coronary artery without angina pectoris: Secondary | ICD-10-CM | POA: Diagnosis not present

## 2018-11-02 DIAGNOSIS — G4733 Obstructive sleep apnea (adult) (pediatric): Secondary | ICD-10-CM | POA: Diagnosis not present

## 2018-11-02 DIAGNOSIS — I10 Essential (primary) hypertension: Secondary | ICD-10-CM | POA: Diagnosis not present

## 2018-11-02 DIAGNOSIS — E559 Vitamin D deficiency, unspecified: Secondary | ICD-10-CM | POA: Diagnosis not present

## 2018-11-06 ENCOUNTER — Telehealth: Payer: Self-pay | Admitting: Neurology

## 2018-11-06 NOTE — Telephone Encounter (Signed)
Rx was increase and sent to pharmacy on 10/30/18 patient hasnt been on this new dose that long. Any suggestion?

## 2018-11-06 NOTE — Telephone Encounter (Signed)
Patient wife states that patient medication  Seroquel was increased and he is still having hallucinations late in the afternoon and into the night. He is seeing snakes around his feet and all over his body please call

## 2018-11-06 NOTE — Telephone Encounter (Signed)
Increase 3:30 pm dosage to seroquel, 2 at 3:30 and 2 at bed

## 2018-11-06 NOTE — Telephone Encounter (Signed)
Spoke with patient spouse she was informed and will try the increase dose and keep the office post on how he is doing.

## 2018-11-07 DIAGNOSIS — G4733 Obstructive sleep apnea (adult) (pediatric): Secondary | ICD-10-CM | POA: Diagnosis not present

## 2018-11-07 DIAGNOSIS — I2510 Atherosclerotic heart disease of native coronary artery without angina pectoris: Secondary | ICD-10-CM | POA: Diagnosis not present

## 2018-11-07 DIAGNOSIS — N400 Enlarged prostate without lower urinary tract symptoms: Secondary | ICD-10-CM | POA: Diagnosis not present

## 2018-11-07 DIAGNOSIS — G20 Parkinson's disease: Secondary | ICD-10-CM | POA: Diagnosis not present

## 2018-11-07 DIAGNOSIS — E559 Vitamin D deficiency, unspecified: Secondary | ICD-10-CM | POA: Diagnosis not present

## 2018-11-07 DIAGNOSIS — I10 Essential (primary) hypertension: Secondary | ICD-10-CM | POA: Diagnosis not present

## 2018-11-09 DIAGNOSIS — N400 Enlarged prostate without lower urinary tract symptoms: Secondary | ICD-10-CM | POA: Diagnosis not present

## 2018-11-09 DIAGNOSIS — G4733 Obstructive sleep apnea (adult) (pediatric): Secondary | ICD-10-CM | POA: Diagnosis not present

## 2018-11-09 DIAGNOSIS — I2510 Atherosclerotic heart disease of native coronary artery without angina pectoris: Secondary | ICD-10-CM | POA: Diagnosis not present

## 2018-11-09 DIAGNOSIS — G20 Parkinson's disease: Secondary | ICD-10-CM | POA: Diagnosis not present

## 2018-11-09 DIAGNOSIS — I10 Essential (primary) hypertension: Secondary | ICD-10-CM | POA: Diagnosis not present

## 2018-11-09 DIAGNOSIS — E559 Vitamin D deficiency, unspecified: Secondary | ICD-10-CM | POA: Diagnosis not present

## 2018-11-14 DIAGNOSIS — G20 Parkinson's disease: Secondary | ICD-10-CM | POA: Diagnosis not present

## 2018-11-14 DIAGNOSIS — I2510 Atherosclerotic heart disease of native coronary artery without angina pectoris: Secondary | ICD-10-CM | POA: Diagnosis not present

## 2018-11-14 DIAGNOSIS — R3914 Feeling of incomplete bladder emptying: Secondary | ICD-10-CM | POA: Diagnosis not present

## 2018-11-14 DIAGNOSIS — N400 Enlarged prostate without lower urinary tract symptoms: Secondary | ICD-10-CM | POA: Diagnosis not present

## 2018-11-14 DIAGNOSIS — I10 Essential (primary) hypertension: Secondary | ICD-10-CM | POA: Diagnosis not present

## 2018-11-14 DIAGNOSIS — G4733 Obstructive sleep apnea (adult) (pediatric): Secondary | ICD-10-CM | POA: Diagnosis not present

## 2018-11-14 DIAGNOSIS — E559 Vitamin D deficiency, unspecified: Secondary | ICD-10-CM | POA: Diagnosis not present

## 2018-11-14 DIAGNOSIS — N200 Calculus of kidney: Secondary | ICD-10-CM | POA: Diagnosis not present

## 2018-11-14 DIAGNOSIS — N401 Enlarged prostate with lower urinary tract symptoms: Secondary | ICD-10-CM | POA: Diagnosis not present

## 2018-11-16 DIAGNOSIS — I2510 Atherosclerotic heart disease of native coronary artery without angina pectoris: Secondary | ICD-10-CM | POA: Diagnosis not present

## 2018-11-16 DIAGNOSIS — I10 Essential (primary) hypertension: Secondary | ICD-10-CM | POA: Diagnosis not present

## 2018-11-16 DIAGNOSIS — E559 Vitamin D deficiency, unspecified: Secondary | ICD-10-CM | POA: Diagnosis not present

## 2018-11-16 DIAGNOSIS — N400 Enlarged prostate without lower urinary tract symptoms: Secondary | ICD-10-CM | POA: Diagnosis not present

## 2018-11-16 DIAGNOSIS — G20 Parkinson's disease: Secondary | ICD-10-CM | POA: Diagnosis not present

## 2018-11-16 DIAGNOSIS — G4733 Obstructive sleep apnea (adult) (pediatric): Secondary | ICD-10-CM | POA: Diagnosis not present

## 2018-11-17 ENCOUNTER — Ambulatory Visit: Payer: Medicare Other | Admitting: Neurology

## 2018-11-19 DIAGNOSIS — I2510 Atherosclerotic heart disease of native coronary artery without angina pectoris: Secondary | ICD-10-CM | POA: Diagnosis not present

## 2018-11-19 DIAGNOSIS — M545 Low back pain: Secondary | ICD-10-CM | POA: Diagnosis not present

## 2018-11-19 DIAGNOSIS — I252 Old myocardial infarction: Secondary | ICD-10-CM | POA: Diagnosis not present

## 2018-11-19 DIAGNOSIS — F3341 Major depressive disorder, recurrent, in partial remission: Secondary | ICD-10-CM | POA: Diagnosis not present

## 2018-11-19 DIAGNOSIS — N400 Enlarged prostate without lower urinary tract symptoms: Secondary | ICD-10-CM | POA: Diagnosis not present

## 2018-11-19 DIAGNOSIS — I10 Essential (primary) hypertension: Secondary | ICD-10-CM | POA: Diagnosis not present

## 2018-11-19 DIAGNOSIS — E782 Mixed hyperlipidemia: Secondary | ICD-10-CM | POA: Diagnosis not present

## 2018-11-19 DIAGNOSIS — Z8619 Personal history of other infectious and parasitic diseases: Secondary | ICD-10-CM | POA: Diagnosis not present

## 2018-11-19 DIAGNOSIS — J301 Allergic rhinitis due to pollen: Secondary | ICD-10-CM | POA: Diagnosis not present

## 2018-11-19 DIAGNOSIS — G20 Parkinson's disease: Secondary | ICD-10-CM | POA: Diagnosis not present

## 2018-11-19 DIAGNOSIS — E559 Vitamin D deficiency, unspecified: Secondary | ICD-10-CM | POA: Diagnosis not present

## 2018-11-19 DIAGNOSIS — G4733 Obstructive sleep apnea (adult) (pediatric): Secondary | ICD-10-CM | POA: Diagnosis not present

## 2018-11-19 DIAGNOSIS — G4700 Insomnia, unspecified: Secondary | ICD-10-CM | POA: Diagnosis not present

## 2018-11-19 DIAGNOSIS — G2581 Restless legs syndrome: Secondary | ICD-10-CM | POA: Diagnosis not present

## 2018-11-19 DIAGNOSIS — F411 Generalized anxiety disorder: Secondary | ICD-10-CM | POA: Diagnosis not present

## 2018-11-20 ENCOUNTER — Other Ambulatory Visit: Payer: Medicare Other | Admitting: Licensed Clinical Social Worker

## 2018-11-20 ENCOUNTER — Telehealth: Payer: Self-pay | Admitting: Neurology

## 2018-11-20 ENCOUNTER — Other Ambulatory Visit: Payer: Self-pay

## 2018-11-20 DIAGNOSIS — Z515 Encounter for palliative care: Secondary | ICD-10-CM

## 2018-11-20 NOTE — Telephone Encounter (Signed)
Have wife try seroquel, 25 mg, 1 in the AM, 2 in the afternoon, 2 at bed.

## 2018-11-20 NOTE — Telephone Encounter (Signed)
Called patient wife no answer left message to call office back.

## 2018-11-20 NOTE — Telephone Encounter (Signed)
Patient's wife, [name], returned [employer]'s call.

## 2018-11-20 NOTE — Telephone Encounter (Signed)
Is he still walking with the walker?  We could potentially slightly reduced his levodopa (confirm he is on carbidopa/levodopa 25/250, 1 po tid).  We could also continue to increase his daytime seroquel

## 2018-11-20 NOTE — Telephone Encounter (Signed)
Spoke with Paul Ortiz Patient is still walking on walker He is taken carbidopa-levodopa 25/250 1 po TID seroquel 25 mg 2 capsules afternoon and 2 at bedtime

## 2018-11-20 NOTE — Telephone Encounter (Signed)
Wife is calling in about the Seroquel medication and he is taking 2 in afternoon and 2 at HS. Sleeping through the night and it is  Helping for that. However he is still having the hallucinations- snakes and worms. Saturday was the worst one he has ever had. The medication is helping but not as much for the hallucinations. Please advise. Thanks!

## 2018-11-20 NOTE — Telephone Encounter (Signed)
Spoke with spouse she was informed of provider response below

## 2018-11-21 ENCOUNTER — Telehealth: Payer: Self-pay | Admitting: Neurology

## 2018-11-21 ENCOUNTER — Other Ambulatory Visit: Payer: Self-pay

## 2018-11-21 ENCOUNTER — Emergency Department (HOSPITAL_COMMUNITY): Payer: Medicare Other

## 2018-11-21 ENCOUNTER — Observation Stay (HOSPITAL_BASED_OUTPATIENT_CLINIC_OR_DEPARTMENT_OTHER): Payer: Medicare Other

## 2018-11-21 ENCOUNTER — Encounter (HOSPITAL_COMMUNITY): Payer: Self-pay | Admitting: Internal Medicine

## 2018-11-21 ENCOUNTER — Inpatient Hospital Stay (HOSPITAL_COMMUNITY)
Admission: EM | Admit: 2018-11-21 | Discharge: 2018-11-23 | DRG: 069 | Disposition: A | Discharge status: Home Health | Payer: Medicare Other | Attending: Internal Medicine | Admitting: Internal Medicine

## 2018-11-21 DIAGNOSIS — G459 Transient cerebral ischemic attack, unspecified: Secondary | ICD-10-CM | POA: Diagnosis not present

## 2018-11-21 DIAGNOSIS — E785 Hyperlipidemia, unspecified: Secondary | ICD-10-CM | POA: Diagnosis present

## 2018-11-21 DIAGNOSIS — N401 Enlarged prostate with lower urinary tract symptoms: Secondary | ICD-10-CM | POA: Diagnosis present

## 2018-11-21 DIAGNOSIS — E782 Mixed hyperlipidemia: Secondary | ICD-10-CM | POA: Insufficient documentation

## 2018-11-21 DIAGNOSIS — N400 Enlarged prostate without lower urinary tract symptoms: Secondary | ICD-10-CM | POA: Diagnosis not present

## 2018-11-21 DIAGNOSIS — Z951 Presence of aortocoronary bypass graft: Secondary | ICD-10-CM

## 2018-11-21 DIAGNOSIS — I2510 Atherosclerotic heart disease of native coronary artery without angina pectoris: Secondary | ICD-10-CM | POA: Diagnosis present

## 2018-11-21 DIAGNOSIS — G20 Parkinson's disease: Secondary | ICD-10-CM | POA: Diagnosis present

## 2018-11-21 DIAGNOSIS — I6523 Occlusion and stenosis of bilateral carotid arteries: Secondary | ICD-10-CM | POA: Diagnosis not present

## 2018-11-21 DIAGNOSIS — R29810 Facial weakness: Secondary | ICD-10-CM | POA: Diagnosis present

## 2018-11-21 DIAGNOSIS — G92 Toxic encephalopathy: Secondary | ICD-10-CM | POA: Diagnosis present

## 2018-11-21 DIAGNOSIS — Z8619 Personal history of other infectious and parasitic diseases: Secondary | ICD-10-CM | POA: Diagnosis not present

## 2018-11-21 DIAGNOSIS — R443 Hallucinations, unspecified: Secondary | ICD-10-CM | POA: Diagnosis present

## 2018-11-21 DIAGNOSIS — R4701 Aphasia: Secondary | ICD-10-CM | POA: Diagnosis not present

## 2018-11-21 DIAGNOSIS — Z20828 Contact with and (suspected) exposure to other viral communicable diseases: Secondary | ICD-10-CM | POA: Diagnosis not present

## 2018-11-21 DIAGNOSIS — G9340 Encephalopathy, unspecified: Secondary | ICD-10-CM | POA: Diagnosis present

## 2018-11-21 DIAGNOSIS — G4733 Obstructive sleep apnea (adult) (pediatric): Secondary | ICD-10-CM | POA: Diagnosis present

## 2018-11-21 DIAGNOSIS — T428X5A Adverse effect of antiparkinsonism drugs and other central muscle-tone depressants, initial encounter: Secondary | ICD-10-CM | POA: Diagnosis present

## 2018-11-21 DIAGNOSIS — I6602 Occlusion and stenosis of left middle cerebral artery: Secondary | ICD-10-CM | POA: Diagnosis not present

## 2018-11-21 DIAGNOSIS — R29818 Other symptoms and signs involving the nervous system: Secondary | ICD-10-CM | POA: Diagnosis not present

## 2018-11-21 DIAGNOSIS — Z79899 Other long term (current) drug therapy: Secondary | ICD-10-CM

## 2018-11-21 DIAGNOSIS — I10 Essential (primary) hypertension: Secondary | ICD-10-CM | POA: Diagnosis not present

## 2018-11-21 DIAGNOSIS — I639 Cerebral infarction, unspecified: Secondary | ICD-10-CM | POA: Diagnosis not present

## 2018-11-21 DIAGNOSIS — Z8673 Personal history of transient ischemic attack (TIA), and cerebral infarction without residual deficits: Secondary | ICD-10-CM

## 2018-11-21 DIAGNOSIS — R531 Weakness: Secondary | ICD-10-CM

## 2018-11-21 DIAGNOSIS — G20A1 Parkinson's disease without dyskinesia, without mention of fluctuations: Secondary | ICD-10-CM | POA: Diagnosis present

## 2018-11-21 DIAGNOSIS — E559 Vitamin D deficiency, unspecified: Secondary | ICD-10-CM | POA: Diagnosis not present

## 2018-11-21 DIAGNOSIS — Z7982 Long term (current) use of aspirin: Secondary | ICD-10-CM

## 2018-11-21 DIAGNOSIS — Z66 Do not resuscitate: Secondary | ICD-10-CM | POA: Diagnosis present

## 2018-11-21 LAB — SARS CORONAVIRUS 2 (TAT 6-24 HRS): SARS Coronavirus 2: NEGATIVE

## 2018-11-21 LAB — CBC
HCT: 42.1 % (ref 39.0–52.0)
Hemoglobin: 13.8 g/dL (ref 13.0–17.0)
MCH: 32 pg (ref 26.0–34.0)
MCHC: 32.8 g/dL (ref 30.0–36.0)
MCV: 97.7 fL (ref 80.0–100.0)
Platelets: 201 10*3/uL (ref 150–400)
RBC: 4.31 MIL/uL (ref 4.22–5.81)
RDW: 12.8 % (ref 11.5–15.5)
WBC: 4.8 10*3/uL (ref 4.0–10.5)
nRBC: 0 % (ref 0.0–0.2)

## 2018-11-21 LAB — I-STAT CHEM 8, ED
BUN: 39 mg/dL — ABNORMAL HIGH (ref 8–23)
Calcium, Ion: 1.18 mmol/L (ref 1.15–1.40)
Chloride: 108 mmol/L (ref 98–111)
Creatinine, Ser: 0.9 mg/dL (ref 0.61–1.24)
Glucose, Bld: 105 mg/dL — ABNORMAL HIGH (ref 70–99)
HCT: 40 % (ref 39.0–52.0)
Hemoglobin: 13.6 g/dL (ref 13.0–17.0)
Potassium: 4.3 mmol/L (ref 3.5–5.1)
Sodium: 144 mmol/L (ref 135–145)
TCO2: 26 mmol/L (ref 22–32)

## 2018-11-21 LAB — ECHOCARDIOGRAM COMPLETE
Height: 72 in
Weight: 2709.01 oz

## 2018-11-21 LAB — COMPREHENSIVE METABOLIC PANEL
ALT: 10 U/L (ref 0–44)
AST: 23 U/L (ref 15–41)
Albumin: 3.7 g/dL (ref 3.5–5.0)
Alkaline Phosphatase: 57 U/L (ref 38–126)
Anion gap: 9 (ref 5–15)
BUN: 34 mg/dL — ABNORMAL HIGH (ref 8–23)
CO2: 24 mmol/L (ref 22–32)
Calcium: 9.3 mg/dL (ref 8.9–10.3)
Chloride: 110 mmol/L (ref 98–111)
Creatinine, Ser: 0.91 mg/dL (ref 0.61–1.24)
GFR calc Af Amer: >60 mL/min (ref >60)
GFR calc non Af Amer: >60 mL/min (ref >60)
Glucose, Bld: 106 mg/dL — ABNORMAL HIGH (ref 70–99)
Potassium: 4.2 mmol/L (ref 3.5–5.1)
Sodium: 143 mmol/L (ref 135–145)
Total Bilirubin: 0.7 mg/dL (ref 0.3–1.2)
Total Protein: 6.6 g/dL (ref 6.5–8.1)

## 2018-11-21 LAB — DIFFERENTIAL
Abs Immature Granulocytes: 0.01 10*3/uL (ref 0.00–0.07)
Basophils Absolute: 0 10*3/uL (ref 0.0–0.1)
Basophils Relative: 0 %
Eosinophils Absolute: 0.1 10*3/uL (ref 0.0–0.5)
Eosinophils Relative: 2 %
Immature Granulocytes: 0 %
Lymphocytes Relative: 18 %
Lymphs Abs: 0.8 10*3/uL (ref 0.7–4.0)
Monocytes Absolute: 0.3 10*3/uL (ref 0.1–1.0)
Monocytes Relative: 7 %
Neutro Abs: 3.5 10*3/uL (ref 1.7–7.7)
Neutrophils Relative %: 73 %

## 2018-11-21 LAB — PROTIME-INR
INR: 1 (ref 0.8–1.2)
Prothrombin Time: 12.8 seconds (ref 11.4–15.2)

## 2018-11-21 LAB — APTT: aPTT: 27 seconds (ref 24–36)

## 2018-11-21 LAB — CK: Total CK: 32 U/L — ABNORMAL LOW (ref 49–397)

## 2018-11-21 MED ORDER — GABAPENTIN 100 MG PO CAPS: 200.0000 mg | ORAL_CAPSULE | Freq: Every day | ORAL | Status: DC

## 2018-11-21 MED ORDER — CARBIDOPA-LEVODOPA 25-250 MG PO TABS
1.0000 | ORAL_TABLET | Freq: Three times a day (TID) | ORAL | Status: DC
  Administered 2018-11-21 – 2018-11-22 (×3): 1 via ORAL
  Filled 2018-11-21 (×4): qty 1

## 2018-11-21 MED ORDER — POLYVINYL ALCOHOL 1.4 % OP SOLN
1.0000 [drp] | Freq: Every day | OPHTHALMIC | Status: DC | PRN
  Filled 2018-11-21: qty 15

## 2018-11-21 MED ORDER — SODIUM CHLORIDE 0.9% FLUSH: 3.0000 mL | Freq: Once | INTRAVENOUS | Status: DC

## 2018-11-21 MED ORDER — ASPIRIN 300 MG RE SUPP: 300.0000 mg | Freq: Every day | RECTAL | Status: DC

## 2018-11-21 MED ORDER — ALFUZOSIN HCL ER 10 MG PO TB24
10.0000 mg | ORAL_TABLET | Freq: Every day | ORAL | Status: DC
  Administered 2018-11-21 – 2018-11-22 (×2): 10 mg via ORAL
  Filled 2018-11-21 (×3): qty 1

## 2018-11-21 MED ORDER — ATORVASTATIN CALCIUM 40 MG PO TABS
40.0000 mg | ORAL_TABLET | Freq: Every day | ORAL | Status: DC
  Administered 2018-11-21 – 2018-11-22 (×2): 40 mg via ORAL
  Filled 2018-11-21 (×2): qty 1

## 2018-11-21 MED ORDER — IOHEXOL 350 MG/ML SOLN
100.0000 mL | Freq: Once | INTRAVENOUS | Status: AC | PRN
  Administered 2018-11-21: 13:00:00 100 mL via INTRAVENOUS

## 2018-11-21 MED ORDER — ASPIRIN 325 MG PO TABS: 325.0000 mg | ORAL_TABLET | Freq: Every day | ORAL | Status: DC

## 2018-11-21 MED ORDER — CARBIDOPA-LEVODOPA ER 50-200 MG PO TBCR
1.0000 | EXTENDED_RELEASE_TABLET | Freq: Every day | ORAL | Status: DC
  Administered 2018-11-21: 1 via ORAL
  Filled 2018-11-21 (×2): qty 1

## 2018-11-21 MED ORDER — ACETAMINOPHEN 160 MG/5ML PO SOLN: 650.0000 mg | ORAL | Status: DC | PRN

## 2018-11-21 MED ORDER — CARBIDOPA-LEVODOPA 25-250 MG PO TABS
1.0000 | ORAL_TABLET | Freq: Three times a day (TID) | ORAL | Status: DC
  Administered 2018-11-21 (×2): 1 via ORAL
  Filled 2018-11-21 (×3): qty 1

## 2018-11-21 MED ORDER — STROKE: EARLY STAGES OF RECOVERY BOOK
Freq: Once | Status: AC
  Administered 2018-11-21: 19:00:00
  Filled 2018-11-21: qty 1

## 2018-11-21 MED ORDER — ACETAMINOPHEN 650 MG RE SUPP: 650.0000 mg | RECTAL | Status: DC | PRN

## 2018-11-21 MED ORDER — LORAZEPAM 2 MG/ML IJ SOLN
1.0000 mg | Freq: Once | INTRAMUSCULAR | Status: AC
  Administered 2018-11-22: 1 mg via INTRAVENOUS
  Filled 2018-11-21: qty 1

## 2018-11-21 MED ORDER — ASPIRIN 325 MG PO TABS
325.0000 mg | ORAL_TABLET | Freq: Every day | ORAL | Status: DC
  Administered 2018-11-21: 325 mg via ORAL
  Filled 2018-11-21: qty 1

## 2018-11-21 MED ORDER — ACETAMINOPHEN 325 MG PO TABS
650.0000 mg | ORAL_TABLET | ORAL | Status: DC | PRN
  Administered 2018-11-23: 650 mg via ORAL
  Filled 2018-11-21: qty 2

## 2018-11-21 MED ORDER — ALPRAZOLAM 0.25 MG PO TABS
1.0000 mg | ORAL_TABLET | Freq: Every day | ORAL | Status: DC
  Administered 2018-11-21 – 2018-11-22 (×2): 1 mg via ORAL
  Filled 2018-11-21 (×2): qty 4

## 2018-11-21 MED ORDER — SODIUM CHLORIDE 0.9 % IV SOLN
INTRAVENOUS | Status: DC
  Administered 2018-11-21: 23:00:00 via INTRAVENOUS

## 2018-11-21 MED ORDER — TAMSULOSIN HCL 0.4 MG PO CAPS: 0.4000 mg | ORAL_CAPSULE | Freq: Every day | ORAL | Status: DC

## 2018-11-21 MED ORDER — ENOXAPARIN SODIUM 40 MG/0.4ML ~~LOC~~ SOLN
40.0000 mg | SUBCUTANEOUS | Status: DC
  Administered 2018-11-21 – 2018-11-22 (×2): 40 mg via SUBCUTANEOUS
  Filled 2018-11-21 (×2): qty 0.4

## 2018-11-21 MED ORDER — SENNOSIDES-DOCUSATE SODIUM 8.6-50 MG PO TABS: 1.0000 | ORAL_TABLET | Freq: Every evening | ORAL | Status: DC | PRN

## 2018-11-21 MED ORDER — CARBIDOPA-LEVODOPA 25-250 MG PO TABS
1.0000 | ORAL_TABLET | Freq: Three times a day (TID) | ORAL | Status: DC
  Filled 2018-11-21 (×2): qty 1

## 2018-11-21 MED ORDER — ROTIGOTINE 3 MG/24HR TD PT24: 1.0000 | MEDICATED_PATCH | Freq: Every day | TRANSDERMAL | Status: DC

## 2018-11-21 NOTE — ED Notes (Signed)
L sided weakness noted at arrival

## 2018-11-21 NOTE — Telephone Encounter (Signed)
Wife left msg with after hours about patient suffering from hallucinations and being on the Seroquel medication. She thinks it is related to the medication. The RN instructed her to call EMS and she denied.

## 2018-11-21 NOTE — Telephone Encounter (Signed)
Spoke to wife 10/20 1350 and gave her the below information. They are currently in ER for ?possible stroke. See current telephone note from 10/20 1150 for more details.

## 2018-11-21 NOTE — ED Notes (Signed)
Pt took home ibuprofen; Dr. Tyrone Nine aware

## 2018-11-21 NOTE — Telephone Encounter (Signed)
That wouldn't make much sense that a medication to treat hallucination would cause it.  We obviously started it because he was having significant hallucinations.  However, if she doesn't want to give it to him that is okay.  Hallucinations may increase.

## 2018-11-21 NOTE — ED Notes (Addendum)
Report given to Aaron Edelman, RN, of [facility]; pt pre-assigned, but bed not ready yet; Aaron Edelman to notify this RN when bed ready

## 2018-11-21 NOTE — ED Provider Notes (Signed)
[facility]ERG[facility] DEPARTMENT Provider Note   CSN: [id number] Arrival date & time: 11/21/18  1204  An emergency department physician performed an initial assessment on this suspected stroke patient at 1218.  History   Chief Complaint Chief Complaint  Patient presents with   L side weakness/ CODE STROKE   Aphasia    HPI Paul Ortiz is a 73 y.o. male.     73 yo M with a cc of L sided weakness.  Patient was having physical therapy this morning it was thought he had very significant left-sided weakness.  He has a history of severe Parkinson and he has had some issues with generalized weakness.  Was complaining of a right posterior headache at the onset of this as well.  Had some slurred speech that was new for the wife.  The history is provided by the patient and the spouse.  Illness Severity:  Severe Onset quality:  Sudden Duration:  1 hour Timing:  Constant Progression:  Partially resolved Chronicity:  New Associated symptoms: headaches   Associated symptoms: no abdominal pain, no chest pain, no congestion, no diarrhea, no fever, no myalgias, no rash, no shortness of breath and no vomiting     Past Medical History:  Diagnosis Date   Abdominal wall hernia    BPH with obstruction/lower urinary tract symptoms    Coronary atherosclerosis of native coronary artery    Hypertension    Insomnia    Major depression    Mixed hyperlipidemia    OSA (obstructive sleep apnea)    Parkinson disease ([place])    Vasomotor rhinitis     Patient Active Problem List   Diagnosis Date Noted   TIA (transient ischemic attack) 11/21/2018   Mixed hyperlipidemia    Parkinson disease ([place])    Hypertension    Essential hypertension 07/07/2016   Hyperlipidemia 07/07/2016   Anxiety 07/06/2016   OSA (obstructive sleep apnea) 07/06/2016   Panic attacks 07/06/2016   PD (Parkinson's disease) ([place]) 07/06/2016   RLS (restless legs syndrome)  07/06/2016   Chronic low back pain 06/17/2016   Hemangioma of spine 06/17/2016   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   Pain in both thighs 06/17/2016   SVT (supraventricular tachycardia) ([place]) 04/12/2016   Coronary artery disease involving native coronary artery of native heart without angina pectoris 10/21/2014   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 Authorizing Provider  acetaminophen (TYLENOL) 500 MG tablet Take 1,000 mg by mouth every 6 (six) hours as needed. Alternating with 2 (ibuprofen 200mg  ) every 3 hours at meal times and Bedtime.    [provider]  ALPRAZolam Duanne Moron) 1 MG tablet Take 1 mg by mouth at bedtime.    [provider]  aspirin 325 MG tablet Take 325 mg by mouth daily.    [provider]  carbidopa-levodopa (SINEMET CR) 50-200 MG tablet Take 1 tablet by mouth at bedtime.    [provider]  carbidopa-levodopa (SINEMET IR) 25-250 MG tablet Take 1 tablet  by mouth 3 (three) times daily.    [provider]  Cholecalciferol (VITAMIN D-1000 MAX ST) 1000 units tablet Take 1 tablet by mouth daily.    [provider]  furosemide (LASIX) 20 MG tablet Take 10 mg by mouth daily. Take half tablet (10mg ) once daily    [provider]  gabapentin (NEURONTIN) 100 MG capsule Take 200 mg by mouth at bedtime.     [provider]  ibuprofen (ADVIL) 200 MG tablet Take 400 mg by mouth every 6 (six) hours as needed for moderate pain (rotates with tylenol every 3 hours at meal times and bedtime). Take 2  tab daily     [provider]  ipratropium (ATROVENT) 0.06 % nasal spray Place 2 sprays into the nose as needed. 07/22/14   [provider]  Magnesium 500 MG CAPS Take 250 mg by mouth daily. Taking at noon    [provider]  metoprolol succinate (TOPROL-XL) 25 MG 24 hr tablet Take 12.5 mg by mouth 2 (two) times daily. Half in am and half at night 02/16/16   [provider]  Omega-3 Fatty Acids (FISH OIL) 1000 MG CAPS Take 2,000 mg by mouth daily.     [provider]  ondansetron (ZOFRAN) 8 MG tablet Take 8 mg by mouth every 8 (eight) hours as needed for nausea.     [provider]  potassium chloride (K-DUR) 10 MEQ tablet Take 5 mEq by mouth daily.     [provider]  Propylene Glycol-Glycerin (SOOTHE) 0.6-0.6 % SOLN Place 1 drop into both eyes daily as needed (dry eyes).    [provider]  Rotigotine 3 MG/24HR PT24 Place 1 patch onto the skin daily.    [provider]  tamsulosin (FLOMAX) 0.4 MG CAPS capsule Take 0.4 mg by mouth daily. 03/03/16   [provider]  vitamin B-12 (CYANOCOBALAMIN) 1000 MCG tablet Take 1,000 mcg by mouth daily.    [provider]    Family History Family History  Problem Relation Age of Onset   Heart attack Father 83   Cancer Sister        non hodgkins lymphoma; scleroderma   Diabetes Daughter    Fibromyalgia Daughter     Social History Social History   Tobacco Use   Smoking status: Never Smoker   Smokeless tobacco: Never Used  Substance Use Topics   Alcohol use: No   Drug use: No     Allergies   Lactose intolerance (gi) and Penicillins   Review of Systems Review of Systems  Constitutional: Negative for chills and fever.  HENT: Negative for congestion and facial swelling.   Eyes: Negative for discharge and visual disturbance.  Respiratory: Negative for shortness of breath.   Cardiovascular: Negative for chest pain and palpitations.    Gastrointestinal: Negative for abdominal pain, diarrhea and vomiting.  Musculoskeletal: Negative for arthralgias and myalgias.  Skin: Negative for color change and rash.  Neurological: Positive for speech difficulty, weakness and headaches. Negative for tremors, syncope and numbness.  Psychiatric/Behavioral: Negative for confusion and dysphoric mood.     Physical Exam Updated Vital Signs BP (!) 167/85    Pulse 76    Temp 97.8 F (36.6 C) (Oral)    Resp 15    Ht 6' (1.829 m)    Wt 76.8 kg    SpO2 98%    BMI 22.96 kg/m   Physical Exam Vitals signs and nursing note reviewed.  Constitutional:  Appearance: He is well-developed.  HENT:     Head: Normocephalic and atraumatic.  Eyes:     Pupils: Pupils are equal, round, and reactive to light.  Neck:     Musculoskeletal: Normal range of motion and neck supple.     Vascular: No JVD.  Cardiovascular:     Rate and Rhythm: Normal rate and regular rhythm.     Heart sounds: No murmur. No friction rub. No gallop.   Pulmonary:     Effort: No respiratory distress.     Breath sounds: No wheezing.  Abdominal:     General: There is no distension.     Tenderness: There is no guarding or rebound.  Musculoskeletal: Normal range of motion.  Skin:    Coloration: Skin is not pale.     Findings: No rash.  Neurological:     Mental Status: He is alert.     Comments: Diffusely weak but worse on the left than the right.  He has a bilateral arm sway on exam.  Psychiatric:        Behavior: Behavior normal.      ED Treatments / Results  Labs (all labs ordered are listed, but only abnormal results are displayed) Labs Reviewed  COMPREHENSIVE METABOLIC PANEL - Abnormal; Notable for the following components:      Result Value   Glucose, Bld 106 (*)    BUN 34 (*)    All other components within normal limits  I-STAT CHEM 8, ED - Abnormal; Notable for the following components:   BUN 39 (*)    Glucose, Bld 105 (*)    All other components within  normal limits  SARS CORONAVIRUS 2 (TAT 6-24 HRS)  PROTIME-INR  APTT  CBC  DIFFERENTIAL  CBG MONITORING, ED    EKG EKG Interpretation  Date/Time:  Tuesday November 21 2018 12:52:15 EDT Ventricular Rate:  70 PR Interval:    QRS Duration: 134 QT Interval:  439 QTC Calculation: 474 R Axis:   -75 Text Interpretation:  AV dissociation Ventricular premature complex Right bundle branch block Inferior infarct, old No significant change since last tracing Confirmed by Deno Etienne [id number]) on 11/21/2018 1:04:09 PM Also confirmed by Deno Etienne [phone]), editor Hattie Perch [phone])  on 11/21/2018 2:29:21 PM   Radiology Ct Angio Head W Or Wo Contrast  Result Date: 11/21/2018 CLINICAL DATA:  Focal neuro deficit for less than 6 hours, stroke suspected. Last seen normal 2 hours ago. Left facial droop. Aphasia. Left-sided weakness. EXAM: CT ANGIOGRAPHY HEAD AND NECK TECHNIQUE: Multidetector CT imaging of the head and neck was performed using the standard protocol during bolus administration of intravenous contrast. Multiplanar CT image reconstructions and MIPs were obtained to evaluate the vascular anatomy. Carotid stenosis measurements (when applicable) are obtained utilizing NASCET criteria, using the distal internal carotid diameter as the denominator. CONTRAST:  158mL OMNIPAQUE IOHEXOL 350 MG/ML SOLN COMPARISON:  CTA of the head and neck 06/14/2018 FINDINGS: CTA NECK FINDINGS Aortic arch: A 3 vessel arch configuration is present. Atherosclerotic changes are noted at the arch without significant stenosis or aneurysm. Right carotid system: The right common carotid artery is within normal limits. Minimal atherosclerotic changes noted at the right carotid bifurcation. The cervical right ICA is normal. Left carotid system: The left common carotid artery is within normal limits. Mild atherosclerotic changes are noted at the bifurcation. There is no significant stenosis. Cervical left ICA is normal.  Vertebral arteries: The left vertebral artery is slightly dominant to the right. Both vertebral  arteries originate from the subclavian arteries without significant stenosis. There is some calcification at the origin of the left vertebral artery. There is no significant stenosis of either vertebral artery in the neck. 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 Vertebral body heights and alignment are maintained. No focal lytic or blastic lesions are present otherwise. Other neck: Dominant right thyroid nodule measures 12.5 cm maximally. Other smaller lesions are present and stable. No significant adenopathy is present. Salivary glands and ducts are normal. Upper chest: The lung apices are clear. Thoracic inlet is within normal limits. Review of the MIP images confirms the above findings CTA HEAD FINDINGS Anterior circulation: Atherosclerotic calcifications are present within the cavernous internal carotid arteries bilaterally without a significant stenosis through the ICA termini. There is no significant interval change. The ICA termini are within normal limits. There is moderate to severe stenosis of the more distal left A2 segment. Posterior circulation: M1 segments are within normal limits. MCA bifurcations are intact. Distal segmental small vessel disease is present without other significant proximal stenosis or branch vessel occlusion. Venous sinuses: The left vertebral artery is dominant. Atherosclerotic changes are noted at the dural margin without significant stenosis. Vertebrobasilar junction is normal. The basilar artery is normal. Both posterior cerebral arteries originates from the basilar tip. There is some narrowing of distal PCA branch vessels without a significant proximal stenosis or occlusion. Anatomic variants: None Review of the MIP images confirms the above findings IMPRESSION: 1. No significant proximal stenosis, aneurysm, or branch vessel occlusion within the Circle of Willis. 2. Mild  atherosclerotic changes at the carotid bifurcations bilaterally without significant stenosis. 3. Atherosclerotic changes within the cavernous internal carotid arteries bilaterally without significant stenosis. 4. Moderate to severe stenosis of the more distal left A2 segment. 5. Mild diffuse medium and small vessel disease compatible with intracranial atherosclerosis. 6. Dominant right thyroid nodule measures 12.5 cm maximally. This does not meet criteria for further imaging. Recommend no additional imaging. Electronically Signed   By: San Morelle M.D.   On: 11/21/2018 13:16   Ct Angio Neck W Or Wo Contrast  Result Date: 11/21/2018 CLINICAL DATA:  Focal neuro deficit for less than 6 hours, stroke suspected. Last seen normal 2 hours ago. Left facial droop. Aphasia. Left-sided weakness. EXAM: CT ANGIOGRAPHY HEAD AND NECK TECHNIQUE: Multidetector CT imaging of the head and neck was performed using the standard protocol during bolus administration of intravenous contrast. Multiplanar CT image reconstructions and MIPs were obtained to evaluate the vascular anatomy. Carotid stenosis measurements (when applicable) are obtained utilizing NASCET criteria, using the distal internal carotid diameter as the denominator. CONTRAST:  140mL OMNIPAQUE IOHEXOL 350 MG/ML SOLN COMPARISON:  CTA of the head and neck 06/14/2018 FINDINGS: CTA NECK FINDINGS Aortic arch: A 3 vessel arch configuration is present. Atherosclerotic changes are noted at the arch without significant stenosis or aneurysm. Right carotid system: The right common carotid artery is within normal limits. Minimal atherosclerotic changes noted at the right carotid bifurcation. The cervical right ICA is normal. Left carotid system: The left common carotid artery is within normal limits. Mild atherosclerotic changes are noted at the bifurcation. There is no significant stenosis. Cervical left ICA is normal. Vertebral arteries: The left vertebral artery is  slightly dominant to the right. Both vertebral arteries originate from the subclavian arteries without significant stenosis. There is some calcification at the origin of the left vertebral artery. There is no significant stenosis of either vertebral artery in the neck. Skeleton: Hemangioma is again noted at  C7. Vertebral body heights and alignment are maintained. No focal lytic or blastic lesions are present otherwise. Other neck: Dominant right thyroid nodule measures 12.5 cm maximally. Other smaller lesions are present and stable. No significant adenopathy is present. Salivary glands and ducts are normal. Upper chest: The lung apices are clear. Thoracic inlet is within normal limits. Review of the MIP images confirms the above findings CTA HEAD FINDINGS Anterior circulation: Atherosclerotic calcifications are present within the cavernous internal carotid arteries bilaterally without a significant stenosis through the ICA termini. There is no significant interval change. The ICA termini are within normal limits. There is moderate to severe stenosis of the more distal left A2 segment. Posterior circulation: M1 segments are within normal limits. MCA bifurcations are intact. Distal segmental small vessel disease is present without other significant proximal stenosis or branch vessel occlusion. Venous sinuses: The left vertebral artery is dominant. Atherosclerotic changes are noted at the dural margin without significant stenosis. Vertebrobasilar junction is normal. The basilar artery is normal. Both posterior cerebral arteries originates from the basilar tip. There is some narrowing of distal PCA branch vessels without a significant proximal stenosis or occlusion. Anatomic variants: None Review of the MIP images confirms the above findings IMPRESSION: 1. No significant proximal stenosis, aneurysm, or branch vessel occlusion within the Circle of Willis. 2. Mild atherosclerotic changes at the carotid bifurcations  bilaterally without significant stenosis. 3. Atherosclerotic changes within the cavernous internal carotid arteries bilaterally without significant stenosis. 4. Moderate to severe stenosis of the more distal left A2 segment. 5. Mild diffuse medium and small vessel disease compatible with intracranial atherosclerosis. 6. Dominant right thyroid nodule measures 12.5 cm maximally. This does not meet criteria for further imaging. Recommend no additional imaging. Electronically Signed   By: San Morelle M.D.   On: 11/21/2018 13:16   Ct Head Code Stroke Wo Contrast  Result Date: 11/21/2018 CLINICAL DATA:  Code stroke. Possible stroke. Left facial droop, aphasia EXAM: CT HEAD WITHOUT CONTRAST TECHNIQUE: Contiguous axial images were obtained from the base of the skull through the vertex without intravenous contrast. COMPARISON:  CT head 02/16/2018 FINDINGS: Brain: Generalized atrophy. Diffuse white matter hypodensity is chronic and stable. This extends into the internal capsule bilaterally. Chronic infarct left thalamus. Negative for acute infarct.  Negative for acute hemorrhage or mass. Vascular: Negative for hyperdense vessel. Atherosclerotic calcification cavernous carotid bilaterally. Skull: Negative Sinuses/Orbits: Paranasal sinuses clear bilaterally. Bilateral cataract surgery. Other: None ASPECTS ([place] Stroke Program Early CT Score) - Ganglionic level infarction (caudate, lentiform nuclei, internal capsule, insula, M1-M3 cortex): 7 - Supraganglionic infarction (M4-M6 cortex): 3 Total score (0-10 with 10 being normal): 10 IMPRESSION: 1. No acute intracranial abnormality 2. ASPECTS is 10 3. Moderate chronic microvascular ischemic change. 4. Results texted to Dr. Cheral Marker. Electronically Signed   By: Franchot Gallo M.D.   On: 11/21/2018 12:39    Procedures Procedures (including critical care time)  Medications Ordered in ED Medications  sodium chloride flush (NS) 0.9 % injection 3 mL (has no  administration in time range)  carbidopa-levodopa (SINEMET IR) 25-250 MG per tablet immediate release 1 tablet (1 tablet Oral Given 11/21/18 1333)  LORazepam (ATIVAN) injection 1 mg (has no administration in time range)  aspirin tablet 325 mg (has no administration in time range)  ALPRAZolam (XANAX) tablet 1 mg (has no administration in time range)  tamsulosin (FLOMAX) capsule 0.4 mg (has no administration in time range)  carbidopa-levodopa (SINEMET CR) 50-200 MG per tablet controlled release 1 tablet (has no administration  in time range)  gabapentin (NEURONTIN) capsule 200 mg (has no administration in time range)  Rotigotine PT24 1 patch (has no administration in time range)  Propylene Glycol-Glycerin 0.6-0.6 % SOLN 1 drop (has no administration in time range)  enoxaparin (LOVENOX) injection 40 mg (has no administration in time range)   stroke: mapping our early stages of recovery book (has no administration in time range)  0.9 %  sodium chloride infusion (has no administration in time range)  acetaminophen (TYLENOL) tablet 650 mg (has no administration in time range)    Or  acetaminophen (TYLENOL) solution 650 mg (has no administration in time range)    Or  acetaminophen (TYLENOL) suppository 650 mg (has no administration in time range)  senna-docusate (Senokot-S) tablet 1 tablet (has no administration in time range)  aspirin suppository 300 mg (has no administration in time range)    Or  aspirin tablet 325 mg (has no administration in time range)  iohexol (OMNIPAQUE) 350 MG/ML injection 100 mL (100 mLs Intravenous Contrast Given 11/21/18 1238)     Initial Impression / Assessment and Plan / ED Course  I have reviewed the triage vital signs and the nursing notes.  Pertinent labs & imaging results that were available during my care of the patient were reviewed by me and considered in my medical decision making (see chart for details).        73 yo M with a chief complaint of  left-sided weakness that happened within the past hour.  I was called to triage to evaluate the patient.  As he had new onset weakness within the hour that was evident to his wife I had him call code stroke.  Seen by neuro.  Felt to be improving did not give TPA.  recommened admission.   The patients results and plan were reviewed and discussed.   Any x-rays performed were independently reviewed by myself.   Differential diagnosis were considered with the presenting HPI.  Medications  sodium chloride flush (NS) 0.9 % injection 3 mL (has no administration in time range)  carbidopa-levodopa (SINEMET IR) 25-250 MG per tablet immediate release 1 tablet (1 tablet Oral Given 11/21/18 1333)  LORazepam (ATIVAN) injection 1 mg (has no administration in time range)  aspirin tablet 325 mg (has no administration in time range)  ALPRAZolam (XANAX) tablet 1 mg (has no administration in time range)  tamsulosin (FLOMAX) capsule 0.4 mg (has no administration in time range)  carbidopa-levodopa (SINEMET CR) 50-200 MG per tablet controlled release 1 tablet (has no administration in time range)  gabapentin (NEURONTIN) capsule 200 mg (has no administration in time range)  Rotigotine PT24 1 patch (has no administration in time range)  Propylene Glycol-Glycerin 0.6-0.6 % SOLN 1 drop (has no administration in time range)  enoxaparin (LOVENOX) injection 40 mg (has no administration in time range)   stroke: mapping our early stages of recovery book (has no administration in time range)  0.9 %  sodium chloride infusion (has no administration in time range)  acetaminophen (TYLENOL) tablet 650 mg (has no administration in time range)    Or  acetaminophen (TYLENOL) solution 650 mg (has no administration in time range)    Or  acetaminophen (TYLENOL) suppository 650 mg (has no administration in time range)  senna-docusate (Senokot-S) tablet 1 tablet (has no administration in time range)  aspirin suppository 300 mg (has  no administration in time range)    Or  aspirin tablet 325 mg (has no administration in time range)  iohexol (  OMNIPAQUE) 350 MG/ML injection 100 mL (100 mLs Intravenous Contrast Given 11/21/18 1238)    Vitals:   11/21/18 1430 11/21/18 1445 11/21/18 1500 11/21/18 1515  BP: 140/75 (!) 161/85 (!) 173/86 (!) 167/85  Pulse: 62 68 75 76  Resp: 11 16 17 15   Temp:      TempSrc:      SpO2: 97% 99% 98% 98%  Weight:      Height:        Final diagnoses:  Left-sided weakness    Admission/ observation were discussed with the admitting physician, patient and/or family and they are comfortable with the plan.    Final Clinical Impressions(s) / ED Diagnoses   Final diagnoses:  Left-sided weakness    ED Discharge Orders    None       Deno Etienne, DO 11/21/18 1528

## 2018-11-21 NOTE — ED Triage Notes (Signed)
ECHO Tech at bed side for scan

## 2018-11-21 NOTE — Code Documentation (Signed)
73yo male arriving to [facility] via private vehicle at 1204. Patient from home where he was working with PT at 1100 and developed left sided weakness and slurred speech. Code stroke called on arrival to the ED. Stroke team met patient in CT. NIHSS 5, see documentation for details and code stroke times. Patient with mild left nasolabial fold flattening and drift in all extremities on exam. Patient reporting generalized weakness. CTA head and neck completed. Patient with h/o Parkinson's disease and dementia with recent changes in his Seroquel dose due to hallucinations per wife. Symptoms currently mild, however, patient remains in the window to treat with tPA until 1530 should symptoms worsen. Patient transported back to the ED. Bedside handoff with ED RN Hannie.  

## 2018-11-21 NOTE — Progress Notes (Signed)
Obtained report from ED RN, waiting on EVS to finish cleaning room

## 2018-11-21 NOTE — Consult Note (Signed)
NEURO HOSPITALIST CONSULT NOTE   Requestig physician: Dr. Tyrone Nine  Reason for Consult: Acute onset of left facial droop, left sided drooling and LUE weakness  History obtained from:  Patient and Wife  HPI:                                                                                                                                          Paul Ortiz is an 73 y.o. male with Parkinson's disease who presents with acute onset of left facial droop, slurred speech, left sided drooling and LUE weakness. LKN was 1100. Wife also has noticed that his hallucinations have worsened since recently starting Seroquel. He has formed visual hallucinations of snakes and tactile hallucinations of snakes and worms crawling on him. These have paradoxically worsened since starting Seroquel.   Code Stroke was called on arrival to the ED due to slight left sided weakness on arrival, which was resolving per wife. The patient's stroke-like symptoms and signs were resolved following CT head.   Past Medical History:  Diagnosis Date  . Abdominal wall hernia   . BPH with obstruction/lower urinary tract symptoms   . Central sleep apnea   . Coronary atherosclerosis of native coronary artery   . Hypercholesteremia   . Hypertension   . Insomnia   . Major depression   . MI (myocardial infarction) ([place]) 2005  . Mixed hyperlipidemia   . OSA (obstructive sleep apnea)   . Parkinson disease ([place])   . Vasomotor rhinitis     Past Surgical History:  Procedure Laterality Date  . CARDIAC SURGERY    . CATARACT EXTRACTION, BILATERAL Bilateral   . CORONARY ARTERY BYPASS GRAFT  2005  . PEG placement     and reversal for duopa tube    Family History  Problem Relation Age of Onset  . Heart attack Father 32  . Cancer Sister        non hodgkins lymphoma; scleroderma  . Diabetes Daughter   . Fibromyalgia Daughter             *  Social History:  reports that he has never smoked. He has never used  smokeless tobacco. He reports that he does not drink alcohol or use drugs.  Allergies  Allergen Reactions  . Lactose Intolerance (Gi) Diarrhea  . Penicillins Rash    MEDICATIONS:  No current facility-administered medications on file prior to encounter.    Current Outpatient Medications on File Prior to Encounter  Medication Sig Dispense Refill  . acetaminophen (TYLENOL) 500 MG tablet Take 1,000 mg by mouth every 6 (six) hours as needed. Alternating with 2 (ibuprofen 200mg  ) every 3 hours at meal times and Bedtime.    . ALPRAZolam (XANAX) 1 MG tablet Take 1 mg by mouth at bedtime.    Marland Kitchen aspirin 325 MG tablet Take 325 mg by mouth daily.    . carbidopa-levodopa (SINEMET CR) 50-200 MG tablet Take 1 tablet by mouth at bedtime.    . carbidopa-levodopa (SINEMET IR) 25-250 MG tablet Take 1 tablet by mouth 3 (three) times daily.    . Cholecalciferol (VITAMIN D-1000 MAX ST) 1000 units tablet Take 1 tablet by mouth daily.    . furosemide (LASIX) 20 MG tablet Take 10 mg by mouth daily. Take half tablet (10mg ) once daily    . gabapentin (NEURONTIN) 100 MG capsule Take 200 mg by mouth at bedtime.     Marland Kitchen ibuprofen (ADVIL) 200 MG tablet Take 400 mg by mouth every 6 (six) hours as needed for moderate pain (rotates with tylenol every 3 hours at meal times and bedtime). Take 2 tab daily     . ipratropium (ATROVENT) 0.06 % nasal spray Place 2 sprays into the nose as needed.    . Magnesium 500 MG CAPS Take 250 mg by mouth daily. Taking at noon    . metoprolol succinate (TOPROL-XL) 25 MG 24 hr tablet Take 12.5 mg by mouth 2 (two) times daily. Half in am and half at night    . Omega-3 Fatty Acids (FISH OIL) 1000 MG CAPS Take 2,000 mg by mouth daily.     . ondansetron (ZOFRAN) 8 MG tablet Take 8 mg by mouth every 8 (eight) hours as needed for nausea.     . potassium chloride (K-DUR) 10 MEQ tablet  Take 5 mEq by mouth daily.     Marland Kitchen Propylene Glycol-Glycerin (SOOTHE) 0.6-0.6 % SOLN Place 1 drop into both eyes daily as needed (dry eyes).    . QUEtiapine (SEROQUEL) 25 MG tablet Take 1 in afternoon and 2 at bedtime 90 tablet 1  . Rotigotine 3 MG/24HR PT24 Place 1 patch onto the skin daily.    . tamsulosin (FLOMAX) 0.4 MG CAPS capsule Take 0.4 mg by mouth daily.    . vitamin B-12 (CYANOCOBALAMIN) 1000 MCG tablet Take 1,000 mcg by mouth daily.       ROS:                                                                                                                                       Deferred in the context of acuity of presentation.    Blood pressure (!) 147/76, pulse 65, temperature 97.8 F (36.6 C), temperature source Oral, resp. rate 16, height 6' (1.829 m),  weight 76.8 kg, SpO2 99 %.   General Examination:                                                                                                       Physical Exam  HEENT-  [place]/AT    Lungs- Respirations unlabored Extremities- Warm and well perfused  Neurological Examination Mental Status: Awake with decreased level of alertness and decreased attention. Speech is hypophonic but fluent, with minimal dysarthria. Some difficulty with orientation questions. Able to follow all commands.  Cranial Nerves: II:  Visual fields intact bilaterally. No extinction to DSS. PERRL.  III,IV, VI: No ptosis. Squints right eye volitionally. EOMI with saccadic pursuits noted.  V,VII: No facial droop. Facial light touch sensation equal bilaterally VIII: HOH IX,X: Hypophonic speech. Also has a slight vocal tremor XI: Head is midline XII: midline tongue extension Motor: Bradykinetic x 4, lower extremities worse than upper extremities. Cogwheel rigidity in BUE. Rigid without cogwheeling BLE. Strength is 5/5 proximally and distally in all 4 extremities.  Sensory: Tempk and light touch intact throughout, bilaterally Deep Tendon Reflexes: Mildly  hypoactive BUE. Hypoactive BLE.  Plantars: Mute bilaterally  Cerebellar: Action tremor bilaterally with FNF. No ataxia noted.  Gait: Deferred   Lab Results: Basic Metabolic Panel: Recent Labs  Lab 11/21/18 1220 11/21/18 1225  NA 143 144  K 4.2 4.3  CL 110 108  CO2 24  --   GLUCOSE 106* 105*  BUN 34* 39*  CREATININE 0.91 0.90  CALCIUM 9.3  --     CBC: Recent Labs  Lab 11/21/18 1220 11/21/18 1225  WBC 4.8  --   NEUTROABS 3.5  --   HGB 13.8 13.6  HCT 42.1 40.0  MCV 97.7  --   PLT 201  --     Cardiac Enzymes: No results for input(s): CKTOTAL, CKMB, CKMBINDEX, TROPONINI in the last 168 hours.  Lipid Panel: No results for input(s): CHOL, TRIG, HDL, CHOLHDL, VLDL, LDLCALC in the last 168 hours.  Imaging: Ct Angio Neck W Or Wo Contrast  Result Date: 11/21/2018 CLINICAL DATA:  Focal neuro deficit for less than 6 hours, stroke suspected. Last seen normal 2 hours ago. Left facial droop. Aphasia. Left-sided weakness. EXAM: CT ANGIOGRAPHY HEAD AND NECK TECHNIQUE: Multidetector CT imaging of the head and neck was performed using the standard protocol during bolus administration of intravenous contrast. Multiplanar CT image reconstructions and MIPs were obtained to evaluate the vascular anatomy. Carotid stenosis measurements (when applicable) are obtained utilizing NASCET criteria, using the distal internal carotid diameter as the denominator. CONTRAST:  168mL OMNIPAQUE IOHEXOL 350 MG/ML SOLN COMPARISON:  CTA of the head and neck 06/14/2018 FINDINGS: CTA NECK FINDINGS Aortic arch: A 3 vessel arch configuration is present. Atherosclerotic changes are noted at the arch without significant stenosis or aneurysm. Right carotid system: The right common carotid artery is within normal limits. Minimal atherosclerotic changes noted at the right carotid bifurcation. The cervical right ICA is normal. Left carotid system: The left common carotid artery is within normal limits. Mild atherosclerotic  changes are noted at the bifurcation.  There is no significant stenosis. Cervical left ICA is normal. Vertebral arteries: The left vertebral artery is slightly dominant to the right. Both vertebral arteries originate from the subclavian arteries without significant stenosis. There is some calcification at the origin of the left vertebral artery. There is no significant stenosis of either vertebral artery in the neck. 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 Vertebral body heights and alignment are maintained. No focal lytic or blastic lesions are present otherwise. Other neck: Dominant right thyroid nodule measures 12.5 cm maximally. Other smaller lesions are present and stable. No significant adenopathy is present. Salivary glands and ducts are normal. Upper chest: The lung apices are clear. Thoracic inlet is within normal limits. Review of the MIP images confirms the above findings CTA HEAD FINDINGS Anterior circulation: Atherosclerotic calcifications are present within the cavernous internal carotid arteries bilaterally without a significant stenosis through the ICA termini. There is no significant interval change. The ICA termini are within normal limits. There is moderate to severe stenosis of the more distal left A2 segment. Posterior circulation: M1 segments are within normal limits. MCA bifurcations are intact. Distal segmental small vessel disease is present without other significant proximal stenosis or branch vessel occlusion. Venous sinuses: The left vertebral artery is dominant. Atherosclerotic changes are noted at the dural margin without significant stenosis. Vertebrobasilar junction is normal. The basilar artery is normal. Both posterior cerebral arteries originates from the basilar tip. There is some narrowing of distal PCA branch vessels without a significant proximal stenosis or occlusion. Anatomic variants: None Review of the MIP images confirms the above findings IMPRESSION: 1. No significant  proximal stenosis, aneurysm, or branch vessel occlusion within the Circle of Willis. 2. Mild atherosclerotic changes at the carotid bifurcations bilaterally without significant stenosis. 3. Atherosclerotic changes within the cavernous internal carotid arteries bilaterally without significant stenosis. 4. Moderate to severe stenosis of the more distal left A2 segment. 5. Mild diffuse medium and small vessel disease compatible with intracranial atherosclerosis. 6. Dominant right thyroid nodule measures 12.5 cm maximally. This does not meet criteria for further imaging. Recommend no additional imaging. Electronically Signed   By: San Morelle M.D.   On: 11/21/2018 13:16   Ct Head Code Stroke Wo Contrast  Result Date: 11/21/2018 CLINICAL DATA:  Code stroke. Possible stroke. Left facial droop, aphasia EXAM: CT HEAD WITHOUT CONTRAST TECHNIQUE: Contiguous axial images were obtained from the base of the skull through the vertex without intravenous contrast. COMPARISON:  CT head 02/16/2018 FINDINGS: Brain: Generalized atrophy. Diffuse white matter hypodensity is chronic and stable. This extends into the internal capsule bilaterally. Chronic infarct left thalamus. Negative for acute infarct.  Negative for acute hemorrhage or mass. Vascular: Negative for hyperdense vessel. Atherosclerotic calcification cavernous carotid bilaterally. Skull: Negative Sinuses/Orbits: Paranasal sinuses clear bilaterally. Bilateral cataract surgery. Other: None ASPECTS ([place] Stroke Program Early CT Score) - Ganglionic level infarction (caudate, lentiform nuclei, internal capsule, insula, M1-M3 cortex): 7 - Supraganglionic infarction (M4-M6 cortex): 3 Total score (0-10 with 10 being normal): 10 IMPRESSION: 1. No acute intracranial abnormality 2. ASPECTS is 10 3. Moderate chronic microvascular ischemic change. 4. Results texted to Dr. Cheral Marker. Electronically Signed   By: Franchot Gallo M.D.   On: 11/21/2018 12:39    Assessment: 73  year old male presenting with left facial droop, LUE weakness and left sided drooling.  1. Overall presentation most consistent with TIA. CT head showed possible hyperdense right M1 segment, but this was shown to be a false positive based on CTA head/neck  results which demonstrated vessel patency. Based upon initial results of work up, his TIA was most likely secondary to transient small vessel occlusion within the right pons, right thalamus, right basal ganglia, right internal capsule or right sided deep white matter overlapping the motor tracts. Classifiable as having failed ASA monotherapy. Stroke risk factors include CAD, HLD, HTN and OSA.  2. Not a tPA candidate due to resolution of symptoms.  3. Also with combined visual and tactile hallucinations which have paradoxically worsened with recently-started Seroquel.    Recommendations: 1. Stroke/TIA work up to include MRI brain, TTE and cardiac telemetry.  2. PT/OT/Speech 3. Add Plavix to ASA.  4. Permissive HTN x 24 hours 5. Atorvastatin 40 mg po qd. Obtain CK level.  6. Continue Sinemet 7. Discontinue Seroquel   Electronically signed: Dr. Kerney Elbe 11/21/2018, 1:57 PM

## 2018-11-21 NOTE — Telephone Encounter (Signed)
Patient wife called to let us know they are on the way to [facility] for possible stroke with patient. The home health nurse suggested it because of some symptoms he was having so they are going to get him checked out. Thanks!

## 2018-11-21 NOTE — Telephone Encounter (Signed)
Patient's wife called with concern over increased hallucinations. Yesterday afternoon (10/19) she was in contact with our office and his Seroquel 25 mg  was increased per Dr. Carles Collet to 1 in am / 2 in afternoon  / 2 at bed.  She stated he had the two at bed last night and it was "absolutely horrible". About 30 min - 1 hour after taking med he sees people and especially snakes and worms squeezing him. He was terrified and she had a hard time getting him out of the bathroom as he was convinced he was being squeezed by a snake. Saturday night he had chest pain. Wife stated last night he was up all night and talking in inaudible voice/tone. Wife states she can't take this much longer and they can not manage like this.  She stated she has not given him the am dose today and he is currently sleeping.  On call Nurse overnight told her to take patient to ER but wife declined b/c she thought they wouldn't be able to do anything. Wife wanted me to inform Dr. Carles Collet she believes this medication is causing the hallucinations and wants to know if he can stop taking it. Informed wife will share with MD and call her back. Verbalized understanding.

## 2018-11-21 NOTE — Progress Notes (Signed)
  Echocardiogram 2D Echocardiogram has been performed.  Paul Ortiz 11/21/2018, 5:29 PM

## 2018-11-21 NOTE — Telephone Encounter (Signed)
Spoke to wife to inform her of Dr. Doristine Devoid medication feedback/suggestions regarding the seraquel (see telephone note from today originally dated 10/5). Wife verbalized understanding and stated ER doctor today told her he should come off of it for right now anyway.  Wife stated patient had been drooling/couldn't raise left hand when PT at their home today so they went to ER for possible stroke. CT was fine per wife and waiting on MRI. She will call us back and update [place] on patient when d/c - plan is for him to be admitted for a day or two she thought.

## 2018-11-21 NOTE — Progress Notes (Signed)
COMMUNITY PALLIATIVE CARE SW NOTE  PATIENT NAME: Paul Ortiz DOB: 10/22/1945 MRN: [id number]  PRIMARY CARE PROVIDER: Shirline Frees, MD  RESPONSIBLE PARTY:  Acct ID - Guarantor Home Phone Work Phone Relationship Acct Type  [id number] ERRIN, [phone] Self P/F     [address], [place], [place] [address]   Due to the COVID-19 crisis, this virtual check-in visit was done via telephone from my office and it was initiated and consent given by this patient and or family.   PLAN OF CARE and INTERVENTIONS:             1. GOALS OF CARE/ ADVANCE CARE PLANNING:  Wife's goal is for patient to be admitted to [facility].  Patient is a DNR. 2. SOCIAL/EMOTIONAL/SPIRITUAL ASSESSMENT/ INTERVENTIONS:  SW conducted a Sales executive visit with patient's wife, [name].  She reports he is still hallucinating, but has a less stressful reaction to them.  He is sleeping more at night due to a medication change.  [name] stated they both had COVID-19.  She is concerned about having people in her home and declined a home visit from [facility] team at this time.  [name] has a very strong faith which she relies on.  Explored self care activities she can participate in outside of the home.  SW provided active listening and supportive counseling. 3. PATIENT/CAREGIVER EDUCATION/ COPING:  [name] copes by problem-solving. 4. PERSONAL EMERGENCY PLAN:  Wife will contact patient's MD. 5. COMMUNITY RESOURCES COORDINATION/ HEALTH CARE NAVIGATION:  Patient has [facility] CNAs three hours a day and a private CNA from 9p-5a. 6. FINANCIAL/LEGAL CONCERNS/INTERVENTIONS:  None.     SOCIAL HX:  Social History   Tobacco Use  . Smoking status: Never Smoker  . Smokeless tobacco: Never Used  Substance Use Topics  . Alcohol use: No    CODE STATUS:  DNR ADVANCED DIRECTIVES:  LW, HCPOA MOST FORM COMPLETE:  N HOSPICE EDUCATION PROVIDED: Y PPS:  Patient's appetite is normal.  He uses a walker to  ambulate. Duration of visit and documentation:  45 minutes.      Creola Corn Arrie Borrelli, LCSW

## 2018-11-21 NOTE — ED Triage Notes (Signed)
Pt in as code stroke LSN 1100 per wife. Hx of parkinsons and TIA. Wife states during hu=ome PT eval, pt became L side flaccid and started having slurred speech. Slight L side wkns present on arrival, resolving per wife. Code stroke called on arrival

## 2018-11-21 NOTE — H&P (Addendum)
History and Physical    Paul Ortiz [id number] DOB: 1945-06-04 DOA: 11/21/2018  PCP: Shirline Frees, MD Consultants:  Tat - neurology; Palliative Care; Eugene - urology Patient coming from:  Home - lives with wife; NOK: Wife, [phone]  Chief Complaint:  Code stroke  HPI: Paul Ortiz is a 73 y.o. male with medical history significant of Parkinson's; OSA; HLD; CAD; and HTN presenting with code stroke.  This morning between 1030-11, he started drooling so bad he wet his shirt.  His left arm was weak.  He had left facial droop.  He has chronic weakness and rigidity without tremor.  [facility] and PT was there today and the therapist was concerned this was a stroke.  He improved while the PT was there and so did not clearly recommend ER evaluation.  Symptoms have essentially resolved.  He has been having severe hallucinations; he was given Seroquel but this seemed to make him worse.  Previously, he was seeing people in the house but now he is seeing snakes on him and big worms - he has not had Seroquel today and has not had hallucinations.  He was recently prescribed a medication to help him empty his bladder and that has been tremendously helpful for him.   ED Course:  ?CVA - severe left-sided weakness around the time with PT showed up.  Improved on arrival.  Code stroke called. No tPA but needs stroke evaluation.  Review of Systems: As per HPI; otherwise review of systems reviewed and negative.   Ambulatory Status:  Ambulates with a walker or wheelchair  Past Medical History:  Diagnosis Date   Abdominal wall hernia    BPH with obstruction/lower urinary tract symptoms    Coronary atherosclerosis of native coronary artery    Hypertension    Insomnia    Major depression    Mixed hyperlipidemia    OSA (obstructive sleep apnea)    Parkinson disease (HCC)    Vasomotor rhinitis     Past Surgical History:  Procedure Laterality Date   CARDIAC SURGERY     CATARACT  EXTRACTION, BILATERAL Bilateral    CORONARY ARTERY BYPASS GRAFT  2005   PEG placement     and reversal for duopa tube - continuous infusion of Carbidopa/Levodopa, but he was too somnolent and so had it reversed    Social History   Socioeconomic History   Marital status: Married    Spouse name: Not on file   Number of children: 2   Years of education: Not on file   Highest education level: High school graduate  Occupational History   Occupation: retired    Comment: [employer]  Social Designer, fashion/clothing strain: Not on file   Food insecurity    Worry: Not on file    Inability: Not on Lexicographer needs    Medical: Not on file    Non-medical: Not on file  Tobacco Use   Smoking status: Never Smoker   Smokeless tobacco: Never Used  Substance and Sexual Activity   Alcohol use: No   Drug use: No   Sexual activity: Never  Lifestyle   Physical activity    Days per week: Not on file    Minutes per session: Not on file   Stress: Not on file  Relationships   Social connections    Talks on phone: Not on file    Gets together: Not on file    Attends religious service: Not on file  Active member of club or organization: Not on file    Attends meetings of clubs or organizations: Not on file    Relationship status: Not on file   Intimate partner violence    Fear of current or ex partner: Not on file    Emotionally abused: Not on file    Physically abused: Not on file    Forced sexual activity: Not on file  Other Topics Concern   Not on file  Social History Narrative   Not on file    Allergies  Allergen Reactions   Lactose Intolerance (Gi) Diarrhea   Penicillins Rash    Family History  Problem Relation Age of Onset   Heart attack Father 68   Cancer Sister        non hodgkins lymphoma; scleroderma   Diabetes Daughter    Fibromyalgia Daughter     Prior to Admission medications   Medication Sig Start Date End Date  Taking? Authorizing Provider  acetaminophen (TYLENOL) 500 MG tablet Take 1,000 mg by mouth every 6 (six) hours as needed. Alternating with 2 (ibuprofen 200mg  ) every 3 hours at meal times and Bedtime.    [provider]  ALPRAZolam Duanne Moron) 1 MG tablet Take 1 mg by mouth at bedtime.    [provider]  aspirin 325 MG tablet Take 325 mg by mouth daily.    [provider]  carbidopa-levodopa (SINEMET CR) 50-200 MG tablet Take 1 tablet by mouth at bedtime.    [provider]  carbidopa-levodopa (SINEMET IR) 25-250 MG tablet Take 1 tablet by mouth 3 (three) times daily.    [provider]  Cholecalciferol (VITAMIN D-1000 MAX ST) 1000 units tablet Take 1 tablet by mouth daily.    [provider]  furosemide (LASIX) 20 MG tablet Take 10 mg by mouth daily. Take half tablet (10mg ) once daily    [provider]  gabapentin (NEURONTIN) 100 MG capsule Take 200 mg by mouth at bedtime.     [provider]  ibuprofen (ADVIL) 200 MG tablet Take 400 mg by mouth every 6 (six) hours as needed for moderate pain (rotates with tylenol every 3 hours at meal times and bedtime). Take 2 tab daily     [provider]  ipratropium (ATROVENT) 0.06 % nasal spray Place 2 sprays into the nose as needed. 07/22/14   [provider]  Magnesium 500 MG CAPS Take 250 mg by mouth daily. Taking at noon    [provider]  metoprolol succinate (TOPROL-XL) 25 MG 24 hr tablet Take 12.5 mg by mouth 2 (two) times daily. Half in am and half at night 02/16/16   [provider]  Omega-3 Fatty Acids (FISH OIL) 1000 MG CAPS Take 2,000 mg by mouth daily.     [provider]  ondansetron (ZOFRAN) 8 MG tablet Take 8 mg by mouth every 8 (eight) hours as needed for nausea.     [provider]  potassium chloride (K-DUR) 10 MEQ tablet Take 5 mEq by mouth daily.     [provider]  Propylene Glycol-Glycerin (SOOTHE) 0.6-0.6  % SOLN Place 1 drop into both eyes daily as needed (dry eyes).    [provider]  QUEtiapine (SEROQUEL) 25 MG tablet Take 1 in afternoon and 2 at bedtime 10/30/18   Tat, Eustace Quail, DO  Rotigotine 3 MG/24HR PT24 Place 1 patch onto the skin daily.    [provider]  tamsulosin (FLOMAX) 0.4 MG CAPS capsule  Take 0.4 mg by mouth daily. 03/03/16   [provider]  vitamin B-12 (CYANOCOBALAMIN) 1000 MCG tablet Take 1,000 mcg by mouth daily.    [provider]    Physical Exam: Vitals:   11/21/18 1530 11/21/18 1545 11/21/18 1600 11/21/18 1630  BP: (!) 159/88 (!) 151/75 121/86 (!) 147/77  Pulse: 74 76 73 75  Resp: 14 16 (!) 9 16  Temp:      TempSrc:      SpO2: 98% 97% 97% 95%  Weight:      Height:          General:  Appears calm and comfortable and is NAD, stoic  Eyes:  PERRL, EOMI, normal lids, iris  ENT:  grossly normal hearing, lips & tongue, mmm  Neck:  no LAD, masses or thyromegaly  Cardiovascular:  RRR, no m/r/g. No LE edema.   Respiratory:   CTA bilaterally with no wheezes/rales/rhonchi.  Normal respiratory effort.  Abdomen:  soft, NT, ND, NABS  Skin:  no rash or induration seen on limited exam  Musculoskeletal:  Rigid tone BUE/BLE, good ROM, no bony abnormality  Psychiatric: stoic mood and affect, speech slow but appropriate, AOx3  Neurologic:  CN 2-12 grossly intact, moves all extremities in coordinated fashion    Radiological Exams on Admission: Ct Angio Head W Or Wo Contrast  Result Date: 11/21/2018 CLINICAL DATA:  Focal neuro deficit for less than 6 hours, stroke suspected. Last seen normal 2 hours ago. Left facial droop. Aphasia. Left-sided weakness. EXAM: CT ANGIOGRAPHY HEAD AND NECK TECHNIQUE: Multidetector CT imaging of the head and neck was performed using the standard protocol during bolus administration of intravenous contrast. Multiplanar CT image reconstructions and MIPs were obtained to evaluate the vascular anatomy.  Carotid stenosis measurements (when applicable) are obtained utilizing NASCET criteria, using the distal internal carotid diameter as the denominator. CONTRAST:  139mL OMNIPAQUE IOHEXOL 350 MG/ML SOLN COMPARISON:  CTA of the head and neck 06/14/2018 FINDINGS: CTA NECK FINDINGS Aortic arch: A 3 vessel arch configuration is present. Atherosclerotic changes are noted at the arch without significant stenosis or aneurysm. Right carotid system: The right common carotid artery is within normal limits. Minimal atherosclerotic changes noted at the right carotid bifurcation. The cervical right ICA is normal. Left carotid system: The left common carotid artery is within normal limits. Mild atherosclerotic changes are noted at the bifurcation. There is no significant stenosis. Cervical left ICA is normal. Vertebral arteries: The left vertebral artery is slightly dominant to the right. Both vertebral arteries originate from the subclavian arteries without significant stenosis. There is some calcification at the origin of the left vertebral artery. There is no significant stenosis of either vertebral artery in the neck. 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 Vertebral body heights and alignment are maintained. No focal lytic or blastic lesions are present otherwise. Other neck: Dominant right thyroid nodule measures 12.5 cm maximally. Other smaller lesions are present and stable. No significant adenopathy is present. Salivary glands and ducts are normal. Upper chest: The lung apices are clear. Thoracic inlet is within normal limits. Review of the MIP images confirms the above findings CTA HEAD FINDINGS Anterior circulation: Atherosclerotic calcifications are present within the cavernous internal carotid arteries bilaterally without a significant stenosis through the ICA termini. There is no significant interval change. The ICA termini are within normal limits. There is moderate to severe stenosis of the more distal left A2  segment. Posterior circulation: M1 segments are within normal limits. MCA bifurcations are  intact. Distal segmental small vessel disease is present without other significant proximal stenosis or branch vessel occlusion. Venous sinuses: The left vertebral artery is dominant. Atherosclerotic changes are noted at the dural margin without significant stenosis. Vertebrobasilar junction is normal. The basilar artery is normal. Both posterior cerebral arteries originates from the basilar tip. There is some narrowing of distal PCA branch vessels without a significant proximal stenosis or occlusion. Anatomic variants: None Review of the MIP images confirms the above findings IMPRESSION: 1. No significant proximal stenosis, aneurysm, or branch vessel occlusion within the Circle of Willis. 2. Mild atherosclerotic changes at the carotid bifurcations bilaterally without significant stenosis. 3. Atherosclerotic changes within the cavernous internal carotid arteries bilaterally without significant stenosis. 4. Moderate to severe stenosis of the more distal left A2 segment. 5. Mild diffuse medium and small vessel disease compatible with intracranial atherosclerosis. 6. Dominant right thyroid nodule measures 12.5 cm maximally. This does not meet criteria for further imaging. Recommend no additional imaging. Electronically Signed   By: San Morelle M.D.   On: 11/21/2018 13:16   Ct Angio Neck W Or Wo Contrast  Result Date: 11/21/2018 CLINICAL DATA:  Focal neuro deficit for less than 6 hours, stroke suspected. Last seen normal 2 hours ago. Left facial droop. Aphasia. Left-sided weakness. EXAM: CT ANGIOGRAPHY HEAD AND NECK TECHNIQUE: Multidetector CT imaging of the head and neck was performed using the standard protocol during bolus administration of intravenous contrast. Multiplanar CT image reconstructions and MIPs were obtained to evaluate the vascular anatomy. Carotid stenosis measurements (when applicable) are obtained  utilizing NASCET criteria, using the distal internal carotid diameter as the denominator. CONTRAST:  176mL OMNIPAQUE IOHEXOL 350 MG/ML SOLN COMPARISON:  CTA of the head and neck 06/14/2018 FINDINGS: CTA NECK FINDINGS Aortic arch: A 3 vessel arch configuration is present. Atherosclerotic changes are noted at the arch without significant stenosis or aneurysm. Right carotid system: The right common carotid artery is within normal limits. Minimal atherosclerotic changes noted at the right carotid bifurcation. The cervical right ICA is normal. Left carotid system: The left common carotid artery is within normal limits. Mild atherosclerotic changes are noted at the bifurcation. There is no significant stenosis. Cervical left ICA is normal. Vertebral arteries: The left vertebral artery is slightly dominant to the right. Both vertebral arteries originate from the subclavian arteries without significant stenosis. There is some calcification at the origin of the left vertebral artery. There is no significant stenosis of either vertebral artery in the neck. 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 Vertebral body heights and alignment are maintained. No focal lytic or blastic lesions are present otherwise. Other neck: Dominant right thyroid nodule measures 12.5 cm maximally. Other smaller lesions are present and stable. No significant adenopathy is present. Salivary glands and ducts are normal. Upper chest: The lung apices are clear. Thoracic inlet is within normal limits. Review of the MIP images confirms the above findings CTA HEAD FINDINGS Anterior circulation: Atherosclerotic calcifications are present within the cavernous internal carotid arteries bilaterally without a significant stenosis through the ICA termini. There is no significant interval change. The ICA termini are within normal limits. There is moderate to severe stenosis of the more distal left A2 segment. Posterior circulation: M1 segments are within normal  limits. MCA bifurcations are intact. Distal segmental small vessel disease is present without other significant proximal stenosis or branch vessel occlusion. Venous sinuses: The left vertebral artery is dominant. Atherosclerotic changes are noted at the dural margin without significant stenosis. Vertebrobasilar junction is  normal. The basilar artery is normal. Both posterior cerebral arteries originates from the basilar tip. There is some narrowing of distal PCA branch vessels without a significant proximal stenosis or occlusion. Anatomic variants: None Review of the MIP images confirms the above findings IMPRESSION: 1. No significant proximal stenosis, aneurysm, or branch vessel occlusion within the Circle of Willis. 2. Mild atherosclerotic changes at the carotid bifurcations bilaterally without significant stenosis. 3. Atherosclerotic changes within the cavernous internal carotid arteries bilaterally without significant stenosis. 4. Moderate to severe stenosis of the more distal left A2 segment. 5. Mild diffuse medium and small vessel disease compatible with intracranial atherosclerosis. 6. Dominant right thyroid nodule measures 12.5 cm maximally. This does not meet criteria for further imaging. Recommend no additional imaging. Electronically Signed   By: San Morelle M.D.   On: 11/21/2018 13:16   Ct Head Code Stroke Wo Contrast  Result Date: 11/21/2018 CLINICAL DATA:  Code stroke. Possible stroke. Left facial droop, aphasia EXAM: CT HEAD WITHOUT CONTRAST TECHNIQUE: Contiguous axial images were obtained from the base of the skull through the vertex without intravenous contrast. COMPARISON:  CT head 02/16/2018 FINDINGS: Brain: Generalized atrophy. Diffuse white matter hypodensity is chronic and stable. This extends into the internal capsule bilaterally. Chronic infarct left thalamus. Negative for acute infarct.  Negative for acute hemorrhage or mass. Vascular: Negative for hyperdense vessel.  Atherosclerotic calcification cavernous carotid bilaterally. Skull: Negative Sinuses/Orbits: Paranasal sinuses clear bilaterally. Bilateral cataract surgery. Other: None ASPECTS ([place] Stroke Program Early CT Score) - Ganglionic level infarction (caudate, lentiform nuclei, internal capsule, insula, M1-M3 cortex): 7 - Supraganglionic infarction (M4-M6 cortex): 3 Total score (0-10 with 10 being normal): 10 IMPRESSION: 1. No acute intracranial abnormality 2. ASPECTS is 10 3. Moderate chronic microvascular ischemic change. 4. Results texted to Dr. Cheral Marker. Electronically Signed   By: Franchot Gallo M.D.   On: 11/21/2018 12:39    EKG: Independently reviewed.  AV dissociation with rate 70; nonspecific ST changes with no evidence of acute ischemia; NSCSLT   Labs on Admission: I have personally reviewed the available labs and imaging studies at the time of the admission.  Pertinent labs:   Glucose 106 BUN 34/Creatinine 0.91/GFR >60 Normal CBC INR 1.0 COVID POSITIVE on 8/28 and 9/8  Assessment/Plan Principal Problem:   TIA (transient ischemic attack) Active Problems:   Essential hypertension   Hyperlipidemia   Parkinson disease (HCC)   TIA -Patient with h/o Parkinson's presenting with acute onset this AM of left-sided facial droop, left-sided weakness, and confusion that resolved -Concerning for TIA/CVA -Will admit to observation status for CVA/TIA evaluation -Telemetry monitoring -MRI -Echo -Risk stratification with FLP, A1c; will also check TSH  -ASA daily -Neurology consult -PT/OT/ST/Nutrition Consults -SW consult for placement - wife has been trying to have him placed at [facility]  HTN -Allow permissive HTN for now -Treat BP only if >220/120, and then with goal of 15% reduction -Hold Toprol XL and plan to restart in 48-72 hours   HLD -Check FLP -Start Lipitor 40 mg daily empirically -Hold fish oil   Parkinson's -Continue home dose of Sinemet, takes both IR and  CR -Continue Xanax -Enrolled in palliative care at home -recently started on Seroquel but hallucinations have not improved and actually appear to have worsened; Seroquel discontinued, as per Dr. Cheral Marker    Note: This patient has been tested and is negative for the novel coronavirus COVID-19.     DVT prophylaxis:  Lovenox  Code Status: DNR - confirmed with patient/family Family Communication: Wife  present throughout evaluation Disposition Plan:  Home once clinically improved Consults called: Neurology; PT/OT/ST/SW/Nutrition  Admission status: It is my clinical opinion that referral for OBSERVATION is reasonable and necessary in this patient based on the above information provided. The aforementioned taken together are felt to place the patient at high risk for further clinical deterioration. However it is anticipated that the patient may be medically stable for discharge from the hospital within 24 to 48 hours.     Karmen Bongo MD [facility]   How to contact the [facility] Attending or Consulting provider [place] or covering provider during after hours [place], for this patient?  1. Check the care team in [facility] and look for a) attending/consulting [facility] provider listed and b) the [facility] team listed 2. Log into [email or web address] and use [place]'s universal password to access. If you do not have the password, please contact the hospital operator. 3. Locate the [facility] provider you are looking for under [facility] and page to a number that you can be directly reached. 4. If you still have difficulty reaching the provider, please page the [facility] (Director on Call) for the Hospitalists listed on amion for assistance.   11/21/2018, 8:23 PM

## 2018-11-22 ENCOUNTER — Telehealth: Payer: Self-pay | Admitting: Neurology

## 2018-11-22 ENCOUNTER — Observation Stay (HOSPITAL_COMMUNITY): Payer: Medicare Other

## 2018-11-22 DIAGNOSIS — Z951 Presence of aortocoronary bypass graft: Secondary | ICD-10-CM | POA: Diagnosis not present

## 2018-11-22 DIAGNOSIS — R5381 Other malaise: Secondary | ICD-10-CM | POA: Diagnosis not present

## 2018-11-22 DIAGNOSIS — I10 Essential (primary) hypertension: Secondary | ICD-10-CM | POA: Diagnosis not present

## 2018-11-22 DIAGNOSIS — G459 Transient cerebral ischemic attack, unspecified: Secondary | ICD-10-CM | POA: Diagnosis not present

## 2018-11-22 DIAGNOSIS — G92 Toxic encephalopathy: Secondary | ICD-10-CM | POA: Diagnosis present

## 2018-11-22 DIAGNOSIS — G4733 Obstructive sleep apnea (adult) (pediatric): Secondary | ICD-10-CM | POA: Diagnosis present

## 2018-11-22 DIAGNOSIS — Z8619 Personal history of other infectious and parasitic diseases: Secondary | ICD-10-CM | POA: Diagnosis not present

## 2018-11-22 DIAGNOSIS — R443 Hallucinations, unspecified: Secondary | ICD-10-CM

## 2018-11-22 DIAGNOSIS — Z8673 Personal history of transient ischemic attack (TIA), and cerebral infarction without residual deficits: Secondary | ICD-10-CM | POA: Diagnosis not present

## 2018-11-22 DIAGNOSIS — G9340 Encephalopathy, unspecified: Secondary | ICD-10-CM | POA: Diagnosis present

## 2018-11-22 DIAGNOSIS — Z79899 Other long term (current) drug therapy: Secondary | ICD-10-CM | POA: Diagnosis not present

## 2018-11-22 DIAGNOSIS — R29810 Facial weakness: Secondary | ICD-10-CM | POA: Diagnosis present

## 2018-11-22 DIAGNOSIS — Z66 Do not resuscitate: Secondary | ICD-10-CM | POA: Diagnosis present

## 2018-11-22 DIAGNOSIS — Z7982 Long term (current) use of aspirin: Secondary | ICD-10-CM | POA: Diagnosis not present

## 2018-11-22 DIAGNOSIS — Z20828 Contact with and (suspected) exposure to other viral communicable diseases: Secondary | ICD-10-CM | POA: Diagnosis present

## 2018-11-22 DIAGNOSIS — G20 Parkinson's disease: Secondary | ICD-10-CM

## 2018-11-22 DIAGNOSIS — F0281 Dementia in other diseases classified elsewhere with behavioral disturbance: Secondary | ICD-10-CM | POA: Diagnosis not present

## 2018-11-22 DIAGNOSIS — N401 Enlarged prostate with lower urinary tract symptoms: Secondary | ICD-10-CM | POA: Diagnosis present

## 2018-11-22 DIAGNOSIS — I2510 Atherosclerotic heart disease of native coronary artery without angina pectoris: Secondary | ICD-10-CM | POA: Diagnosis present

## 2018-11-22 DIAGNOSIS — T428X5A Adverse effect of antiparkinsonism drugs and other central muscle-tone depressants, initial encounter: Secondary | ICD-10-CM | POA: Diagnosis present

## 2018-11-22 DIAGNOSIS — E782 Mixed hyperlipidemia: Secondary | ICD-10-CM | POA: Diagnosis present

## 2018-11-22 DIAGNOSIS — Z7401 Bed confinement status: Secondary | ICD-10-CM | POA: Diagnosis not present

## 2018-11-22 DIAGNOSIS — R531 Weakness: Secondary | ICD-10-CM | POA: Diagnosis not present

## 2018-11-22 DIAGNOSIS — R4701 Aphasia: Secondary | ICD-10-CM | POA: Diagnosis present

## 2018-11-22 DIAGNOSIS — M2550 Pain in unspecified joint: Secondary | ICD-10-CM | POA: Diagnosis not present

## 2018-11-22 LAB — LIPID PANEL
Cholesterol: 194 mg/dL (ref 0–200)
HDL: 38 mg/dL — ABNORMAL LOW (ref >40)
LDL Cholesterol: 148 mg/dL — ABNORMAL HIGH (ref 0–99)
Total CHOL/HDL Ratio: 5.1 RATIO
Triglycerides: 40 mg/dL (ref <150)
VLDL: 8 mg/dL (ref 0–40)

## 2018-11-22 LAB — HEMOGLOBIN A1C
Hgb A1c MFr Bld: 5.1 % (ref 4.8–5.6)
Mean Plasma Glucose: 99.67 mg/dL

## 2018-11-22 LAB — TSH: TSH: 0.808 u[IU]/mL (ref 0.350–4.500)

## 2018-11-22 MED ORDER — CARBIDOPA-LEVODOPA 25-100 MG PO TABS
2.0000 | ORAL_TABLET | Freq: Three times a day (TID) | ORAL | Status: DC
  Administered 2018-11-22 – 2018-11-23 (×4): 2 via ORAL
  Filled 2018-11-22 (×4): qty 2

## 2018-11-22 MED ORDER — ADULT MULTIVITAMIN W/MINERALS CH
1.0000 | ORAL_TABLET | Freq: Every day | ORAL | Status: DC
  Administered 2018-11-22 – 2018-11-23 (×2): 1 via ORAL
  Filled 2018-11-22 (×2): qty 1

## 2018-11-22 MED ORDER — CARBIDOPA-LEVODOPA 25-250 MG PO TABS
0.5000 | ORAL_TABLET | Freq: Three times a day (TID) | ORAL | Status: DC
  Filled 2018-11-22 (×2): qty 1

## 2018-11-22 MED ORDER — CARBIDOPA-LEVODOPA ER 50-200 MG PO TBCR
1.0000 | EXTENDED_RELEASE_TABLET | Freq: Every day | ORAL | Status: DC
  Administered 2018-11-22: 1 via ORAL
  Filled 2018-11-22: qty 1

## 2018-11-22 MED ORDER — ASPIRIN EC 81 MG PO TBEC
81.0000 mg | DELAYED_RELEASE_TABLET | Freq: Every day | ORAL | Status: DC
  Administered 2018-11-22 – 2018-11-23 (×2): 81 mg via ORAL
  Filled 2018-11-22 (×2): qty 1

## 2018-11-22 MED ORDER — CLOPIDOGREL BISULFATE 75 MG PO TABS
75.0000 mg | ORAL_TABLET | Freq: Every day | ORAL | Status: DC
  Administered 2018-11-22 – 2018-11-23 (×2): 75 mg via ORAL
  Filled 2018-11-22 (×2): qty 1

## 2018-11-22 MED ORDER — CARBIDOPA-LEVODOPA ER 50-200 MG PO TBCR
0.5000 | EXTENDED_RELEASE_TABLET | Freq: Every day | ORAL | Status: DC
  Filled 2018-11-22: qty 0.5

## 2018-11-22 NOTE — Progress Notes (Signed)
STROKE TEAM PROGRESS NOTE   INTERVAL HISTORY Pt lying in bed, wife at bedside. Pt complains of back pain due to lying in bed so long. As per wife, he had acute onset left facial droop and left UE weakness, gradually resolved with EMS and in ED. MRI negative for stroke. Wife also stated that pt usually at home can walk with walker, seldomly became weak and needed sitting on walker not able to back to bed. However, since yesterday episode, he was not able to walk even with walker, wife stated that she can not take pt home like this. Also stated that pt cognitive impairment also getting worse month by month. He still has hallucination and the seroquel has not helped so far and wife concerning it even made hallucination worse.   Vitals:   11/21/18 2217 11/21/18 2352 11/22/18 0415 11/22/18 0834  BP: 112/80 125/73 (!) 148/82 (!) 148/77  Pulse: 70 72 70 75  Resp: 18 18 19 16   Temp: 98.3 F (36.8 C) 98.8 F (37.1 C) 98.2 F (36.8 C) 97.9 F (36.6 C)  TempSrc: Oral Oral Oral Oral  SpO2: 98% 97% 96% 96%  Weight:      Height:        CBC:  Recent Labs  Lab 11/21/18 1220 11/21/18 1225  WBC 4.8  --   NEUTROABS 3.5  --   HGB 13.8 13.6  HCT 42.1 40.0  MCV 97.7  --   PLT 201  --     Basic Metabolic Panel:  Recent Labs  Lab 11/21/18 1220 11/21/18 1225  NA 143 144  K 4.2 4.3  CL 110 108  CO2 24  --   GLUCOSE 106* 105*  BUN 34* 39*  CREATININE 0.91 0.90  CALCIUM 9.3  --    Lipid Panel:     Component Value Date/Time   CHOL 194 11/22/2018 0309   TRIG 40 11/22/2018 0309   HDL 38 (L) 11/22/2018 0309   CHOLHDL 5.1 11/22/2018 0309   VLDL 8 11/22/2018 0309   LDLCALC 148 (H) 11/22/2018 0309   HgbA1c:  Lab Results  Component Value Date   HGBA1C 5.1 11/22/2018   Urine Drug Screen: No results found for: LABOPIA, COCAINSCRNUR, LABBENZ, AMPHETMU, THCU, LABBARB  Alcohol Level No results found for: ETH  IMAGING Ct Angio Head W Or Wo Contrast  Result Date: 11/21/2018 CLINICAL DATA:   Focal neuro deficit for less than 6 hours, stroke suspected. Last seen normal 2 hours ago. Left facial droop. Aphasia. Left-sided weakness. EXAM: CT ANGIOGRAPHY HEAD AND NECK TECHNIQUE: Multidetector CT imaging of the head and neck was performed using the standard protocol during bolus administration of intravenous contrast. Multiplanar CT image reconstructions and MIPs were obtained to evaluate the vascular anatomy. Carotid stenosis measurements (when applicable) are obtained utilizing NASCET criteria, using the distal internal carotid diameter as the denominator. CONTRAST:  156mL OMNIPAQUE IOHEXOL 350 MG/ML SOLN COMPARISON:  CTA of the head and neck 06/14/2018 FINDINGS: CTA NECK FINDINGS Aortic arch: A 3 vessel arch configuration is present. Atherosclerotic changes are noted at the arch without significant stenosis or aneurysm. Right carotid system: The right common carotid artery is within normal limits. Minimal atherosclerotic changes noted at the right carotid bifurcation. The cervical right ICA is normal. Left carotid system: The left common carotid artery is within normal limits. Mild atherosclerotic changes are noted at the bifurcation. There is no significant stenosis. Cervical left ICA is normal. Vertebral arteries: The left vertebral artery is slightly dominant to the  right. Both vertebral arteries originate from the subclavian arteries without significant stenosis. There is some calcification at the origin of the left vertebral artery. There is no significant stenosis of either vertebral artery in the neck. 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 Vertebral body heights and alignment are maintained. No focal lytic or blastic lesions are present otherwise. Other neck: Dominant right thyroid nodule measures 12.5 cm maximally. Other smaller lesions are present and stable. No significant adenopathy is present. Salivary glands and ducts are normal. Upper chest: The lung apices are clear. Thoracic inlet is  within normal limits. Review of the MIP images confirms the above findings CTA HEAD FINDINGS Anterior circulation: Atherosclerotic calcifications are present within the cavernous internal carotid arteries bilaterally without a significant stenosis through the ICA termini. There is no significant interval change. The ICA termini are within normal limits. There is moderate to severe stenosis of the more distal left A2 segment. Posterior circulation: M1 segments are within normal limits. MCA bifurcations are intact. Distal segmental small vessel disease is present without other significant proximal stenosis or branch vessel occlusion. Venous sinuses: The left vertebral artery is dominant. Atherosclerotic changes are noted at the dural margin without significant stenosis. Vertebrobasilar junction is normal. The basilar artery is normal. Both posterior cerebral arteries originates from the basilar tip. There is some narrowing of distal PCA branch vessels without a significant proximal stenosis or occlusion. Anatomic variants: None Review of the MIP images confirms the above findings IMPRESSION: 1. No significant proximal stenosis, aneurysm, or branch vessel occlusion within the Circle of Willis. 2. Mild atherosclerotic changes at the carotid bifurcations bilaterally without significant stenosis. 3. Atherosclerotic changes within the cavernous internal carotid arteries bilaterally without significant stenosis. 4. Moderate to severe stenosis of the more distal left A2 segment. 5. Mild diffuse medium and small vessel disease compatible with intracranial atherosclerosis. 6. Dominant right thyroid nodule measures 12.5 cm maximally. This does not meet criteria for further imaging. Recommend no additional imaging. Electronically Signed   By: San Morelle M.D.   On: 11/21/2018 13:16   Ct Angio Neck W Or Wo Contrast  Result Date: 11/21/2018 CLINICAL DATA:  Focal neuro deficit for less than 6 hours, stroke suspected.  Last seen normal 2 hours ago. Left facial droop. Aphasia. Left-sided weakness. EXAM: CT ANGIOGRAPHY HEAD AND NECK TECHNIQUE: Multidetector CT imaging of the head and neck was performed using the standard protocol during bolus administration of intravenous contrast. Multiplanar CT image reconstructions and MIPs were obtained to evaluate the vascular anatomy. Carotid stenosis measurements (when applicable) are obtained utilizing NASCET criteria, using the distal internal carotid diameter as the denominator. CONTRAST:  129mL OMNIPAQUE IOHEXOL 350 MG/ML SOLN COMPARISON:  CTA of the head and neck 06/14/2018 FINDINGS: CTA NECK FINDINGS Aortic arch: A 3 vessel arch configuration is present. Atherosclerotic changes are noted at the arch without significant stenosis or aneurysm. Right carotid system: The right common carotid artery is within normal limits. Minimal atherosclerotic changes noted at the right carotid bifurcation. The cervical right ICA is normal. Left carotid system: The left common carotid artery is within normal limits. Mild atherosclerotic changes are noted at the bifurcation. There is no significant stenosis. Cervical left ICA is normal. Vertebral arteries: The left vertebral artery is slightly dominant to the right. Both vertebral arteries originate from the subclavian arteries without significant stenosis. There is some calcification at the origin of the left vertebral artery. There is no significant stenosis of either vertebral artery in the neck. Skeleton: Hemangioma is  again noted at C7. Vertebral body heights and alignment are maintained. No focal lytic or blastic lesions are present otherwise. Other neck: Dominant right thyroid nodule measures 12.5 cm maximally. Other smaller lesions are present and stable. No significant adenopathy is present. Salivary glands and ducts are normal. Upper chest: The lung apices are clear. Thoracic inlet is within normal limits. Review of the MIP images confirms the  above findings CTA HEAD FINDINGS Anterior circulation: Atherosclerotic calcifications are present within the cavernous internal carotid arteries bilaterally without a significant stenosis through the ICA termini. There is no significant interval change. The ICA termini are within normal limits. There is moderate to severe stenosis of the more distal left A2 segment. Posterior circulation: M1 segments are within normal limits. MCA bifurcations are intact. Distal segmental small vessel disease is present without other significant proximal stenosis or branch vessel occlusion. Venous sinuses: The left vertebral artery is dominant. Atherosclerotic changes are noted at the dural margin without significant stenosis. Vertebrobasilar junction is normal. The basilar artery is normal. Both posterior cerebral arteries originates from the basilar tip. There is some narrowing of distal PCA branch vessels without a significant proximal stenosis or occlusion. Anatomic variants: None Review of the MIP images confirms the above findings IMPRESSION: 1. No significant proximal stenosis, aneurysm, or branch vessel occlusion within the Circle of Willis. 2. Mild atherosclerotic changes at the carotid bifurcations bilaterally without significant stenosis. 3. Atherosclerotic changes within the cavernous internal carotid arteries bilaterally without significant stenosis. 4. Moderate to severe stenosis of the more distal left A2 segment. 5. Mild diffuse medium and small vessel disease compatible with intracranial atherosclerosis. 6. Dominant right thyroid nodule measures 12.5 cm maximally. This does not meet criteria for further imaging. Recommend no additional imaging. Electronically Signed   By: San Morelle M.D.   On: 11/21/2018 13:16   Mr Brain Wo Contrast  Result Date: 11/22/2018 CLINICAL DATA:  73 year old male code stroke presentation yesterday with left facial droop and aphasia. EXAM: MRI HEAD WITHOUT CONTRAST TECHNIQUE:  Multiplanar, multiecho pulse sequences of the brain and surrounding structures were obtained without intravenous contrast. COMPARISON:  CT head, CTA head and neck yesterday. FINDINGS: Brain: No restricted diffusion or evidence of acute infarction. The there are chronic lacunar infarcts in both thalami, the right basal ganglia, and central pons. Superimposed patchy and confluent bilateral cerebral white matter T2 and FLAIR hyperintensity. No chronic cerebral blood products identified. No cortical encephalomalacia. Comparatively mild T2 heterogeneity in the left basal ganglia. Cerebellum appears negative. No midline shift, mass effect, evidence of mass lesion, ventriculomegaly, extra-axial collection or acute intracranial hemorrhage. Cervicomedullary junction and pituitary are within normal limits. Vascular: Major intracranial vascular flow voids are preserved. There is mild generalized intracranial artery dolichoectasia. Skull and upper cervical spine: Negative visible cervical spine. Normal bone marrow signal. Sinuses/Orbits: Postoperative changes to both globes. Paranasal sinuses and mastoids are stable and well pneumatized. Other: Visible internal auditory structures appear normal. Scalp and face soft tissues appear negative. IMPRESSION: 1. No acute intracranial abnormality. 2. Advanced chronic small vessel disease. Electronically Signed   By: Genevie Ann M.D.   On: 11/22/2018 08:14   Ct Head Code Stroke Wo Contrast  Result Date: 11/21/2018 CLINICAL DATA:  Code stroke. Possible stroke. Left facial droop, aphasia EXAM: CT HEAD WITHOUT CONTRAST TECHNIQUE: Contiguous axial images were obtained from the base of the skull through the vertex without intravenous contrast. COMPARISON:  CT head 02/16/2018 FINDINGS: Brain: Generalized atrophy. Diffuse white matter hypodensity is chronic and stable. This  extends into the internal capsule bilaterally. Chronic infarct left thalamus. Negative for acute infarct.  Negative for  acute hemorrhage or mass. Vascular: Negative for hyperdense vessel. Atherosclerotic calcification cavernous carotid bilaterally. Skull: Negative Sinuses/Orbits: Paranasal sinuses clear bilaterally. Bilateral cataract surgery. Other: None ASPECTS (Foley Stroke Program Early CT Score) - Ganglionic level infarction (caudate, lentiform nuclei, internal capsule, insula, M1-M3 cortex): 7 - Supraganglionic infarction (M4-M6 cortex): 3 Total score (0-10 with 10 being normal): 10 IMPRESSION: 1. No acute intracranial abnormality 2. ASPECTS is 10 3. Moderate chronic microvascular ischemic change. 4. Results texted to Dr. Cheral Marker. Electronically Signed   By: Franchot Gallo M.D.   On: 11/21/2018 12:39    PHYSICAL EXAM  Temp:  [97.9 F (36.6 C)-98.8 F (37.1 C)] 98.1 F (36.7 C) (10/21 1100) Pulse Rate:  [68-81] 68 (10/21 1100) Resp:  [16-19] 16 (10/21 1100) BP: (95-148)/(60-82) 130/80 (10/21 1100) SpO2:  [95 %-98 %] 98 % (10/21 1100)  General - Well nourished, well developed, in no apparent distress.  Ophthalmologic - fundi not visualized due to noncooperation.  Cardiovascular - Regular rhythm and rate.  Mental Status -  Level of arousal and orientation to month, place, and person were intact, not orientated to year. Language including expression, naming, repetition, comprehension was assessed and found intact, but significant psychomotor slowing.  Cranial Nerves II - XII - II - Visual field intact OU. III, IV, VI - Extraocular movements intact. V - Facial sensation intact bilaterally. VII - Facial movement intact bilaterally, masked face. VIII - Hearing & vestibular intact bilaterally. X - Palate elevates symmetrically. XI - Chin turning & shoulder shrug intact bilaterally. XII - Tongue protrusion intact.  Motor Strength - The patient's strength was symmetrical in all extremities and pronator drift was absent.  Bulk was normal and fasciculations were absent.   Motor Tone - Muscle tone was  assessed at the neck and appendages and was normal.  Reflexes - The patient's reflexes were symmetrical in all extremities and he had no pathological reflexes.  Sensory - Light touch, temperature/pinprick were assessed and were symmetrical.    Coordination - The patient had normal movements in the hands with no ataxia or dysmetria but bradykinesia.  Tremor was present on the left hand, but more intention tremor, no resting tremor seen.  Gait and Station - deferred.   ASSESSMENT/PLAN Paul Ortiz is a 73 y.o. male with history of Parkinson's disease, HTN, HLD, MI, OSA presenting with L facial droop, slurred speech, L sided drooling and LUE weakness.   R brain TIA  Code Stroke CT head No acute abnormality. Small vessel disease. ASPECTS 10.     CTA head & neck mild atherosclerosis bifurcations & cavernous B ICAs. Moderate to severe distal L A1 stenosis. Small and medium vessel disease. Small R thyroid nodule.    MRI  No acute abnormality. Small vessel disease.   2D Echo EF 60-65%. No source of embolus   LDL 148  HgbA1c 5.1  Lovenox 40 mg sq daily for VTE prophylaxis  aspirin 325 mg daily prior to admission, now on aspirin 81 mg daily and clopidogrel 75 mg daily. Continue DAPT for 3 weeks then PLAVIX alone  Therapy recommendations:  SNF  Disposition:  pending   PD and PD dementia  On sinemet, Xanax  Followed with Dr. Carles Collet at [facility] not tolerating seroquel  Cognitive decline  Followed by palliative care at home  Able to walk with walker at home but not here - PT/OT  recommend SNF  Continue to follow up with Dr. Carles Collet or [facility]  Hypertension  Stable . Long-term BP goal normotensive  Hyperlipidemia  Home meds:  Fish oil  Now on lipitor 40  LDL 148, goal < 70  Continue statin at discharge  Other Stroke Risk Factors  Advanced age  Coronary artery disease  Obstructive sleep apnea  Other Active Problems    Hospital day #  0  Neurology will sign off. Please call with questions. Pt will follow up with Dr. Carles Collet or [facility]. Thanks for the consult.  Rosalin Hawking, MD PhD Stroke Neurology 11/22/2018 4:15 PM    To contact Stroke Continuity provider, please refer to [email or web address] After hours, contact General Neurology

## 2018-11-22 NOTE — TOC Initial Note (Addendum)
Transition of Care [facility]) - Initial/Assessment Note    Patient Details  Name: Paul Ortiz MRN: [id number] Date of Birth: [birth year]  Transition [facility] CM/SW Contact:    Pollie Friar, RN Phone Number: 11/22/2018, 11:37 AM  Clinical Narrative:                 CM met with the patient and his wife in the room. Wife is asking for SNF rehab at [facility]. She has been assisting him at home but he was more mobile than he currently is. She has some hip/ knee issues and is having trouble providing the assistance he needs.  CM completed FL2 and faxed him to [place]. PASAR went to manual review d/t anxiety and his Parkinsons. TOC following.  Addendum: [facility] doesn't have any beds. CM updated wife and she is requesting [facility] or [facility]. CM sent them referrals.  Expected Discharge Plan: Skilled Nursing Facility Barriers to Discharge: Continued Medical Work up   Patient Goals and CMS Choice   CMS Medicare.gov Compare Post Acute Care list provided to:: Patient Represenative (must comment) Choice offered to / list presented to : Spouse  Expected Discharge Plan and Services Expected Discharge Plan: [place] In-house Referral: Clinical Social Work Discharge Planning Services: CM Consult   Living arrangements for the past 2 months: [place]                                      Prior Living Arrangements/Services Living arrangements for the past 2 months: Single Family Home Lives with:: Spouse Patient language and need for interpreter reviewed:: Yes(no needs) Do you feel safe going back to the place where you live?: Yes      Need for Family Participation in Patient Care: Yes (Comment) Care giver support system in place?: Yes (comment)(wife has been providing assistance but has some physical limitations)   Criminal Activity/Legal Involvement Pertinent to Current Situation/Hospitalization: No - Comment as needed  Activities of Daily  Living      Permission Sought/Granted                  Emotional Assessment Appearance:: Appears stated age Attitude/Demeanor/Rapport: Lethargic   Orientation: : Oriented to Self, Oriented to Place, Oriented to  Time   Psych Involvement: No (comment)  Admission diagnosis:  Left-sided weakness [R53.1] Patient Active Problem List   Diagnosis Date Noted  . TIA (transient ischemic attack) 11/21/2018  . Mixed hyperlipidemia   . Parkinson disease ([place])   . Hypertension   . Essential hypertension 07/07/2016  . Hyperlipidemia 07/07/2016  . Anxiety 07/06/2016  . OSA (obstructive sleep apnea) 07/06/2016  . Panic attacks 07/06/2016  . PD (Parkinson's disease) ([place]) 07/06/2016  . RLS (restless legs syndrome) 07/06/2016  . Chronic low back pain 06/17/2016  . Hemangioma of spine 06/17/2016  . 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  . Pain in both thighs 06/17/2016  . SVT (supraventricular tachycardia) ([place]) 04/12/2016  . Coronary artery disease involving native coronary artery of native heart without angina pectoris 10/21/2014  . 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

## 2018-11-22 NOTE — Telephone Encounter (Signed)
Spoke with Dr. Eliseo Squires about the patient.  Expressed concern that wife doesn't trust my opinion/expertise and wife is also unfortunately and understandably overwhelmed.  She may need to take him back to [facility] as it appears she had good relationship with physicians there and trusted them.  Wife had told ER doc that seroquel making hallucinations worse when it is the disease and told by ER doc to stop it.  Pt still with hallucinations, so ER calling to see if they could potentially decrease levodopa.  I don't have objection but he may be stiff.  He is currently on carbidopa/levodopa 25/250, 1 po tid and carbidopa/levodopa 50/200 at bedtime.  Will change to carbidopa/levodopa 25/100, 2 po tid and continue carbidopa/levodopa 50/200 at bedtime.  Dr. Eliseo Squires felt that she felt that ER visit was mostly about wife feeling overwhelmed and wanting placement but pt already on waiting list at [facility].  Happy to help with social support with Education officer, museum, which has been offered to wife as well in past.  Appreciate call from Dr. Eliseo Squires

## 2018-11-22 NOTE — Progress Notes (Signed)
Initial Nutrition Assessment  DOCUMENTATION CODES:   Not applicable  INTERVENTION:    Snacks BID  MVI daily   NUTRITION DIAGNOSIS:   Increased nutrient needs related to acute illness as evidenced by estimated needs.  GOAL:   Patient will meet greater than or equal to 90% of their needs  MONITOR:   PO intake, Supplement acceptance, Weight trends, Labs, I & O's  REASON FOR ASSESSMENT:   Consult Assessment of nutrition requirement/status  ASSESSMENT:   Patient with PMH significant for Parkinson's, HLD, CAD, and HTN. Presents this admission with TIA.   Pt denies having loss of appetite PTA. Typically eats three meals daily that consist of B- blueberry oatmeal or peanut butter/honey bagel L- deli sandwich or salad D- meat, vegetable, grain. Has issues swallowing in the past but has been on a regular diet for years. Tried to use Boost and Ensure but they both cause stomach issues despite being lactose free. Awaiting his first meal tray this admission. Does not wish to try supplementation but willing to have snacks.   Pt endorses a UBW of 170 lb and is unsure is he has lost weight. Records indicate pt weighed 175 lb on 5/13 and 169 lb this admission (insigifiant loss for time frame). Pt shows to have muscle depletion suspect this is multifocal.     I/O: +287 ml since admit UOP: 300 ml x 24 hrs    Medications: reviewed  Labs: CBG 105-106  NUTRITION - FOCUSED PHYSICAL EXAM:    Most Recent Value  Orbital Region  No depletion  Upper Arm Region  Mild depletion  Thoracic and Lumbar Region  Unable to assess  Buccal Region  No depletion  Temple Region  Mild depletion  Clavicle Bone Region  Moderate depletion  Clavicle and Acromion Bone Region  Moderate depletion  Scapular Bone Region  Unable to assess  Dorsal Hand  Moderate depletion  Patellar Region  Mild depletion  Anterior Thigh Region  Mild depletion  Posterior Calf Region  Mild depletion  Edema (RD Assessment)  None   Hair  Reviewed  Eyes  Reviewed  Mouth  Reviewed  Skin  Reviewed  Nails  Reviewed     Diet Order:   Diet Order            Diet Heart Room service appropriate? Yes; Fluid consistency: Thin  Diet effective now              EDUCATION NEEDS:   Education needs have been addressed  Skin:     Last BM:  PTA  Height:   Ht Readings from Last 1 Encounters:  11/21/18 6' (1.829 m)    Weight:   Wt Readings from Last 1 Encounters:  11/21/18 76.8 kg    Ideal Body Weight:  80.9 kg  BMI:  Body mass index is 22.96 kg/m.  Estimated Nutritional Needs:   Kcal:  2200-2400 kcal  Protein:  110-125 grams  Fluid:  >/= 2.2 L/day   Mariana Single RD, LDN Clinical Nutrition Pager # - [phone]

## 2018-11-22 NOTE — Progress Notes (Signed)
[facility] PROGRESS NOTE  Saleh Hollie Beach [id number] DOB: 1945/04/18 DOA: 11/21/2018 PCP: Shirline Frees, MD  Assessment/Plan:  TIA. Presented with left sided weakness, facial droop confusion. At admission no facial droop. Patient with h/o Parkinson's. TIA workup with CT head no acute abnormality, MRI brain no acute abnormality, echo with EF 60%, LDL 148, HgA1c 5.1. continues with generalized weakness and confusion. Evaluated by neuro who recommend 81mg  asa and plavix for 3 weeks then plavix alone. Evaluated by PT who recommend snf.   Acute encephalopathy/hallucination. Have been going on for awhile per wife. May be indication of Parkinson's progression and sinemet can make this worse.  Recently seraquel added and this go worse. Chart review indicates primary neuro Tat noted that sinemet could be reduced to see if this helps. No metabolic derangement, no signs infection. -obtain urinalysis -obtain chest xray -decrease sinemet doses -monitor closely  HTN  Fair control. Home meds includeToprol XL. -hold toprol for now -monitor -likely resume tomorrow  HLD - Lipitor 40 mg daily  -Hold fish oil  Parkinson's see #2.  -Continue Xanax -Enrolled in palliative care at home -recently started on Seroquel but hallucinations have not improved and actually appear to have worsened; Seroquel discontinued, as per Dr. Cheral Marker  Code Status: dnr Family Communication: wife at bedside Disposition Plan: to snf   Consultants:  Stroke team  Procedures:  echo  Antibiotics:    HPI/Subjective: Awake alert but appears lethargic. Sitting in chair. Denies pain/discomfort  Objective: Vitals:   11/22/18 0834 11/22/18 1100  BP: (!) 148/77 130/80  Pulse: 75 68  Resp: 16 16  Temp: 97.9 F (36.6 C) 98.1 F (36.7 C)  SpO2: 96% 98%    Intake/Output Summary (Last 24 hours) at 11/22/2018 1424 Last data filed at 11/22/2018 1411 Gross per 24 hour  Intake 587.42 ml  Output 300 ml   Net 287.42 ml   Filed Weights   11/21/18 1252  Weight: 76.8 kg    Exam:   General:  Sitting in chair. Thin and appears chronically ill  Cardiovascular: rrr no mgr no LE edema  Respiratory: normal effort BS clear bilaterally no wheeze  Abdomen: non-distended non-tender +BS   Musculoskeletal: joints without swelling/erythema  Neuro: slow to respond. Oriented to self only. Has difficulty following simple commands. LE strength 3/5, bilateral grip 4/5.    Data Reviewed: Basic Metabolic Panel: Recent Labs  Lab 11/21/18 1220 11/21/18 1225  NA 143 144  K 4.2 4.3  CL 110 108  CO2 24  --   GLUCOSE 106* 105*  BUN 34* 39*  CREATININE 0.91 0.90  CALCIUM 9.3  --    Liver Function Tests: Recent Labs  Lab 11/21/18 1220  AST 23  ALT 10  ALKPHOS 57  BILITOT 0.7  PROT 6.6  ALBUMIN 3.7   No results for input(s): LIPASE, AMYLASE in the last 168 hours. No results for input(s): AMMONIA in the last 168 hours. CBC: Recent Labs  Lab 11/21/18 1220 11/21/18 1225  WBC 4.8  --   NEUTROABS 3.5  --   HGB 13.8 13.6  HCT 42.1 40.0  MCV 97.7  --   PLT 201  --    Cardiac Enzymes: Recent Labs  Lab 11/21/18 2102  CKTOTAL 32*   BNP (last 3 results) No results for input(s): BNP in the last 8760 hours.  ProBNP (last 3 results) No results for input(s): PROBNP in the last 8760 hours.  CBG: No results for input(s): GLUCAP in the last 168 hours.  Recent Results (from the past 240 hour(s))  SARS CORONAVIRUS 2 (TAT 6-24 HRS) Nasopharyngeal Nasopharyngeal Swab     Status: None   Collection Time: 11/21/18  2:51 PM   Specimen: Nasopharyngeal Swab  Result Value Ref Range Status   SARS Coronavirus 2 NEGATIVE NEGATIVE Final    Comment: (NOTE) SARS-CoV-2 target nucleic acids are NOT DETECTED. The SARS-CoV-2 RNA is generally detectable in upper and lower respiratory specimens during the acute phase of infection. Negative results do not preclude SARS-CoV-2 infection, do not rule  out co-infections with other pathogens, and should not be used as the sole basis for treatment or other patient management decisions. Negative results must be combined with clinical observations, patient history, and epidemiological information. The expected result is Negative. Fact Sheet for Patients: SugarRoll.be Fact Sheet for Healthcare Providers: https://www.woods-mathews.com/ This test is not yet approved or cleared by the [place] FDA and  has been authorized for detection and/or diagnosis of SARS-CoV-2 by FDA under an Emergency Use Authorization (EUA). This EUA will remain  in effect (meaning this test can be used) for the duration of the COVID-19 declaration under Section 56 4(b)(1) of the Act, 21 U.S.C. section 360bbb-3(b)(1), unless the authorization is terminated or revoked sooner. Performed at [facility], [place] [address] [place], [place] [address]      Studies: Ct Angio Head W Or Wo Contrast  Result Date: 11/21/2018 CLINICAL DATA:  Focal neuro deficit for less than 6 hours, stroke suspected. Last seen normal 2 hours ago. Left facial droop. Aphasia. Left-sided weakness. EXAM: CT ANGIOGRAPHY HEAD AND NECK TECHNIQUE: Multidetector CT imaging of the head and neck was performed using the standard protocol during bolus administration of intravenous contrast. Multiplanar CT image reconstructions and MIPs were obtained to evaluate the vascular anatomy. Carotid stenosis measurements (when applicable) are obtained utilizing NASCET criteria, using the distal internal carotid diameter as the denominator. CONTRAST:  153mL OMNIPAQUE IOHEXOL 350 MG/ML SOLN COMPARISON:  CTA of the head and neck 06/14/2018 FINDINGS: CTA NECK FINDINGS Aortic arch: A 3 vessel arch configuration is present. Atherosclerotic changes are noted at the arch without significant stenosis or aneurysm. Right carotid system: The right common carotid artery is within  normal limits. Minimal atherosclerotic changes noted at the right carotid bifurcation. The cervical right ICA is normal. Left carotid system: The left common carotid artery is within normal limits. Mild atherosclerotic changes are noted at the bifurcation. There is no significant stenosis. Cervical left ICA is normal. Vertebral arteries: The left vertebral artery is slightly dominant to the right. Both vertebral arteries originate from the subclavian arteries without significant stenosis. There is some calcification at the origin of the left vertebral artery. There is no significant stenosis of either vertebral artery in the neck. 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 Vertebral body heights and alignment are maintained. No focal lytic or blastic lesions are present otherwise. Other neck: Dominant right thyroid nodule measures 12.5 cm maximally. Other smaller lesions are present and stable. No significant adenopathy is present. Salivary glands and ducts are normal. Upper chest: The lung apices are clear. Thoracic inlet is within normal limits. Review of the MIP images confirms the above findings CTA HEAD FINDINGS Anterior circulation: Atherosclerotic calcifications are present within the cavernous internal carotid arteries bilaterally without a significant stenosis through the ICA termini. There is no significant interval change. The ICA termini are within normal limits. There is moderate to severe stenosis of the more distal left A2 segment. Posterior circulation: M1 segments  are within normal limits. MCA bifurcations are intact. Distal segmental small vessel disease is present without other significant proximal stenosis or branch vessel occlusion. Venous sinuses: The left vertebral artery is dominant. Atherosclerotic changes are noted at the dural margin without significant stenosis. Vertebrobasilar junction is normal. The basilar artery is normal. Both posterior cerebral arteries originates from the basilar  tip. There is some narrowing of distal PCA branch vessels without a significant proximal stenosis or occlusion. Anatomic variants: None Review of the MIP images confirms the above findings IMPRESSION: 1. No significant proximal stenosis, aneurysm, or branch vessel occlusion within the Circle of Willis. 2. Mild atherosclerotic changes at the carotid bifurcations bilaterally without significant stenosis. 3. Atherosclerotic changes within the cavernous internal carotid arteries bilaterally without significant stenosis. 4. Moderate to severe stenosis of the more distal left A2 segment. 5. Mild diffuse medium and small vessel disease compatible with intracranial atherosclerosis. 6. Dominant right thyroid nodule measures 12.5 cm maximally. This does not meet criteria for further imaging. Recommend no additional imaging. Electronically Signed   By: San Morelle M.D.   On: 11/21/2018 13:16   Ct Angio Neck W Or Wo Contrast  Result Date: 11/21/2018 CLINICAL DATA:  Focal neuro deficit for less than 6 hours, stroke suspected. Last seen normal 2 hours ago. Left facial droop. Aphasia. Left-sided weakness. EXAM: CT ANGIOGRAPHY HEAD AND NECK TECHNIQUE: Multidetector CT imaging of the head and neck was performed using the standard protocol during bolus administration of intravenous contrast. Multiplanar CT image reconstructions and MIPs were obtained to evaluate the vascular anatomy. Carotid stenosis measurements (when applicable) are obtained utilizing NASCET criteria, using the distal internal carotid diameter as the denominator. CONTRAST:  142mL OMNIPAQUE IOHEXOL 350 MG/ML SOLN COMPARISON:  CTA of the head and neck 06/14/2018 FINDINGS: CTA NECK FINDINGS Aortic arch: A 3 vessel arch configuration is present. Atherosclerotic changes are noted at the arch without significant stenosis or aneurysm. Right carotid system: The right common carotid artery is within normal limits. Minimal atherosclerotic changes noted at the  right carotid bifurcation. The cervical right ICA is normal. Left carotid system: The left common carotid artery is within normal limits. Mild atherosclerotic changes are noted at the bifurcation. There is no significant stenosis. Cervical left ICA is normal. Vertebral arteries: The left vertebral artery is slightly dominant to the right. Both vertebral arteries originate from the subclavian arteries without significant stenosis. There is some calcification at the origin of the left vertebral artery. There is no significant stenosis of either vertebral artery in the neck. 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 Vertebral body heights and alignment are maintained. No focal lytic or blastic lesions are present otherwise. Other neck: Dominant right thyroid nodule measures 12.5 cm maximally. Other smaller lesions are present and stable. No significant adenopathy is present. Salivary glands and ducts are normal. Upper chest: The lung apices are clear. Thoracic inlet is within normal limits. Review of the MIP images confirms the above findings CTA HEAD FINDINGS Anterior circulation: Atherosclerotic calcifications are present within the cavernous internal carotid arteries bilaterally without a significant stenosis through the ICA termini. There is no significant interval change. The ICA termini are within normal limits. There is moderate to severe stenosis of the more distal left A2 segment. Posterior circulation: M1 segments are within normal limits. MCA bifurcations are intact. Distal segmental small vessel disease is present without other significant proximal stenosis or branch vessel occlusion. Venous sinuses: The left vertebral artery is dominant. Atherosclerotic changes are noted at the dural  margin without significant stenosis. Vertebrobasilar junction is normal. The basilar artery is normal. Both posterior cerebral arteries originates from the basilar tip. There is some narrowing of distal PCA branch vessels  without a significant proximal stenosis or occlusion. Anatomic variants: None Review of the MIP images confirms the above findings IMPRESSION: 1. No significant proximal stenosis, aneurysm, or branch vessel occlusion within the Circle of Willis. 2. Mild atherosclerotic changes at the carotid bifurcations bilaterally without significant stenosis. 3. Atherosclerotic changes within the cavernous internal carotid arteries bilaterally without significant stenosis. 4. Moderate to severe stenosis of the more distal left A2 segment. 5. Mild diffuse medium and small vessel disease compatible with intracranial atherosclerosis. 6. Dominant right thyroid nodule measures 12.5 cm maximally. This does not meet criteria for further imaging. Recommend no additional imaging. Electronically Signed   By: San Morelle M.D.   On: 11/21/2018 13:16   Mr Brain Wo Contrast  Result Date: 11/22/2018 CLINICAL DATA:  73 year old male code stroke presentation yesterday with left facial droop and aphasia. EXAM: MRI HEAD WITHOUT CONTRAST TECHNIQUE: Multiplanar, multiecho pulse sequences of the brain and surrounding structures were obtained without intravenous contrast. COMPARISON:  CT head, CTA head and neck yesterday. FINDINGS: Brain: No restricted diffusion or evidence of acute infarction. The there are chronic lacunar infarcts in both thalami, the right basal ganglia, and central pons. Superimposed patchy and confluent bilateral cerebral white matter T2 and FLAIR hyperintensity. No chronic cerebral blood products identified. No cortical encephalomalacia. Comparatively mild T2 heterogeneity in the left basal ganglia. Cerebellum appears negative. No midline shift, mass effect, evidence of mass lesion, ventriculomegaly, extra-axial collection or acute intracranial hemorrhage. Cervicomedullary junction and pituitary are within normal limits. Vascular: Major intracranial vascular flow voids are preserved. There is mild generalized  intracranial artery dolichoectasia. Skull and upper cervical spine: Negative visible cervical spine. Normal bone marrow signal. Sinuses/Orbits: Postoperative changes to both globes. Paranasal sinuses and mastoids are stable and well pneumatized. Other: Visible internal auditory structures appear normal. Scalp and face soft tissues appear negative. IMPRESSION: 1. No acute intracranial abnormality. 2. Advanced chronic small vessel disease. Electronically Signed   By: Genevie Ann M.D.   On: 11/22/2018 08:14   Ct Head Code Stroke Wo Contrast  Result Date: 11/21/2018 CLINICAL DATA:  Code stroke. Possible stroke. Left facial droop, aphasia EXAM: CT HEAD WITHOUT CONTRAST TECHNIQUE: Contiguous axial images were obtained from the base of the skull through the vertex without intravenous contrast. COMPARISON:  CT head 02/16/2018 FINDINGS: Brain: Generalized atrophy. Diffuse white matter hypodensity is chronic and stable. This extends into the internal capsule bilaterally. Chronic infarct left thalamus. Negative for acute infarct.  Negative for acute hemorrhage or mass. Vascular: Negative for hyperdense vessel. Atherosclerotic calcification cavernous carotid bilaterally. Skull: Negative Sinuses/Orbits: Paranasal sinuses clear bilaterally. Bilateral cataract surgery. Other: None ASPECTS ([place] Stroke Program Early CT Score) - Ganglionic level infarction (caudate, lentiform nuclei, internal capsule, insula, M1-M3 cortex): 7 - Supraganglionic infarction (M4-M6 cortex): 3 Total score (0-10 with 10 being normal): 10 IMPRESSION: 1. No acute intracranial abnormality 2. ASPECTS is 10 3. Moderate chronic microvascular ischemic change. 4. Results texted to Dr. Cheral Marker. Electronically Signed   By: Franchot Gallo M.D.   On: 11/21/2018 12:39    Scheduled Meds: . alfuzosin  10 mg Oral QHS  . ALPRAZolam  1 mg Oral QHS  . aspirin EC  81 mg Oral Daily  . atorvastatin  40 mg Oral q1800  . carbidopa-levodopa  0.5 tablet Oral QHS  .  carbidopa-levodopa  0.5 tablet Oral TID  . clopidogrel  75 mg Oral Daily  . enoxaparin (LOVENOX) injection  40 mg Subcutaneous Q24H  . sodium chloride flush  3 mL Intravenous Once   Continuous Infusions: . sodium chloride 50 mL/hr at 11/21/18 2259    Principal Problem:   TIA (transient ischemic attack) Active Problems:   Hallucinations   Acute encephalopathy   Essential hypertension   Parkinson disease ([place])   Hyperlipidemia    Time spent: 68 minutes    Howard NP  [facility]  If 7PM-7AM, please contact night-coverage at [email or web address], password [facility] 11/22/2018, 2:24 PM  LOS: 0 days

## 2018-11-22 NOTE — Evaluation (Signed)
Speech Language Pathology Evaluation Patient Details Name: Paul Ortiz MRN: [id number] DOB: 08-Apr-1945 Today's Date: 11/22/2018 Time: 1520-1540 SLP Time Calculation (min) (ACUTE ONLY): 20 min  Problem List:  Patient Active Problem List   Diagnosis Date Noted  . Hallucinations 11/22/2018  . Acute encephalopathy 11/22/2018  . TIA (transient ischemic attack) 11/21/2018  . Mixed hyperlipidemia   . Parkinson disease ([place])   . Hypertension   . Essential hypertension 07/07/2016  . Hyperlipidemia 07/07/2016  . Anxiety 07/06/2016  . OSA (obstructive sleep apnea) 07/06/2016  . Panic attacks 07/06/2016  . PD (Parkinson's disease) ([place]) 07/06/2016  . RLS (restless legs syndrome) 07/06/2016  . Chronic low back pain 06/17/2016  . Hemangioma of spine 06/17/2016  . 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  . Pain in both thighs 06/17/2016  . SVT (supraventricular tachycardia) ([place]) 04/12/2016  . Coronary artery disease involving native coronary artery of native heart without angina pectoris 10/21/2014  . 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 Hyperphoria 07/25/2013   Past Medical History:  Past Medical History:  Diagnosis Date  . Abdominal wall hernia   . BPH with obstruction/lower urinary tract symptoms   . Coronary atherosclerosis of native coronary artery   . Hypertension   . Insomnia   . Major depression   . Mixed hyperlipidemia   . OSA (obstructive sleep apnea)   . Parkinson disease ([place])   . Vasomotor rhinitis    Past Surgical History:  Past Surgical History:  Procedure Laterality Date  . CARDIAC SURGERY    . CATARACT EXTRACTION, BILATERAL Bilateral   . CORONARY ARTERY BYPASS GRAFT  2005  . PEG placement     and reversal for duopa tube - continuous infusion of Carbidopa/Levodopa, but he was too somnolent and so had it reversed   HPI:  73 yo male presenting with left facial droop and left UE weakness. PMH including Parkinson's, OSA, HLD, CAD, and HTN. CT and MRI showing no acute  abnormalities.   Assessment / Plan / Recommendation Clinical Impression  The Mini-Mental State Examination (MMSE) was administered. Pt scored 20/27 (reading, writing and copying not administered due to low/double vision), indicating cognitive impairments. Wife reports history of cognitive impairment for some time prior to admit, related to Parkinson's disease. She indicates deterioration of cognition around 3:30 in the afternoons. No significant changes in cognitive linguistic function from baseline. No further acute ST intervention recommended. Home health ST may be beneficial to facilitate establishment of routines and compensatory strategies in pt's familiar environment.    SLP Assessment  SLP Recommendation/Assessment: All further Speech Language Pathology needs can be addressed in the next venue of care SLP Visit Diagnosis: Cognitive communication deficit (R41.841)    Follow Up Recommendations  24 hour supervision/assistance;Skilled Nursing facility home health ST if pt returning home with wife      SLP Evaluation Cognition  Overall Cognitive Status: History of cognitive impairments - at baseline Arousal/Alertness: Awake/alert Orientation Level: Oriented to person;Disoriented to place;Disoriented to time;Disoriented to situation Attention: Focused;Sustained Focused Attention: Appears intact Sustained Attention: Impaired Sustained Attention Impairment: Verbal basic Spike: Impaired Lisandro Impairment: Storage deficit;Decreased short term Kiree;Retrieval deficit;Decreased recall of new information Decreased Short Term Nikolai: Verbal basic Awareness: Impaired Awareness Impairment: Intellectual impairment;Emergent impairment;Anticipatory impairment Problem Solving: Impaired Problem Solving Impairment: Verbal basic       Comprehension  Auditory Comprehension Overall Auditory Comprehension: Appears within functional limits for tasks assessed    Expression Expression Primary Mode  of Expression: Verbal Verbal Expression Overall Verbal Expression: Appears within functional  limits for tasks assessed   Oral / Motor  Oral Motor/Sensory Function Overall Oral Motor/Sensory Function: Within functional limits Motor Speech Overall Motor Speech: Appears within functional limits for tasks assessed   GO                   Fey Coghill B. Quentin Ore, [facility], [place] Speech Language Pathologist Office: [phone] Pager: [phone]  Shonna Chock 11/22/2018, 3:42 PM

## 2018-11-22 NOTE — [place] FL2 (Signed)
[place] MEDICAID FL2 LEVEL OF CARE SCREENING TOOL     IDENTIFICATION  Patient Name: Paul Ortiz Birthdate: 22-Mar-1945 Sex: male Admission Date (Current Location): 11/21/2018  [facility] and [place] Number:  Herbalist and Address:  [facility] [place], [place] [address], [place], [place] [address]      Provider Number: [id number]  Attending Physician Name and Address:  Geradine Girt, DO  Relative Name and Phone Number:       Current Level of Care: Hospital Recommended Level of Care: [place] Prior Approval Number:    Date Approved/Denied:   PASRR Number:  Manual Review  Discharge Plan: SNF    Current Diagnoses: Patient Active Problem List   Diagnosis Date Noted  . TIA (transient ischemic attack) 11/21/2018  . Mixed hyperlipidemia   . Parkinson disease ([place])   . Hypertension   . Essential hypertension 07/07/2016  . Hyperlipidemia 07/07/2016  . Anxiety 07/06/2016  . OSA (obstructive sleep apnea) 07/06/2016  . Panic attacks 07/06/2016  . PD (Parkinson's disease) ([place]) 07/06/2016  . RLS (restless legs syndrome) 07/06/2016  . Chronic low back pain 06/17/2016  . Hemangioma of spine 06/17/2016  . 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  . Pain in both thighs 06/17/2016  . SVT (supraventricular tachycardia) ([place]) 04/12/2016  . Coronary artery disease involving native coronary artery of native heart without angina pectoris 10/21/2014  . 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 Hyperphoria 07/25/2013    Orientation RESPIRATION BLADDER Height & Weight     Self, Time, Place([place] issues)  Normal Continent Weight: 76.8 kg Height:  6' (182.9 cm)  BEHAVIORAL SYMPTOMS/MOOD NEUROLOGICAL BOWEL NUTRITION STATUS      Continent Diet(heart healthy)  AMBULATORY STATUS COMMUNICATION OF NEEDS Skin   Extensive Assist Verbally Normal                       Personal Care Assistance Level of Assistance  Bathing, Feeding, Dressing Bathing Assistance:  Maximum assistance Feeding assistance: Limited assistance Dressing Assistance: Maximum assistance     Functional Limitations Info  Sight, Hearing, Speech Sight Info: Impaired Hearing Info: Adequate Speech Info: Adequate    SPECIAL CARE FACTORS FREQUENCY  PT (By licensed PT), OT (By licensed OT)     PT Frequency: 5x/wk OT Frequency: 5x/wk            Contractures Contractures Info: Not present    Additional Factors Info  Code Status, Allergies, Psychotropic Code Status Info: DNR Allergies Info: Lactose and Penicilin Psychotropic Info: Xanax 1 mg at bedtime         Current Medications (11/22/2018):  This is the current hospital active medication list Current Facility-Administered Medications  Medication Dose Route Frequency Provider Last Rate Last Dose  . 0.9 %  sodium chloride infusion   Intravenous Continuous Karmen Bongo, MD 50 mL/hr at 11/21/18 2259    . acetaminophen (TYLENOL) tablet 650 mg  650 mg Oral Q4H PRN Karmen Bongo, MD       Or  . acetaminophen (TYLENOL) solution 650 mg  650 mg Per Tube Q4H PRN Karmen Bongo, MD       Or  . acetaminophen (TYLENOL) suppository 650 mg  650 mg Rectal Q4H PRN Karmen Bongo, MD      . alfuzosin (UROXATRAL) 24 hr tablet 10 mg  10 mg Oral Ivery Quale, MD   10 mg at 11/21/18 2240  . ALPRAZolam (XANAX) tablet 1 mg  1 mg Oral QHS  Karmen Bongo, MD   1 mg at 11/21/18 2238  . aspirin EC tablet 81 mg  81 mg Oral Daily Rosalin Hawking, MD   81 mg at 11/22/18 0949  . atorvastatin (LIPITOR) tablet 40 mg  40 mg Oral q1800 Karmen Bongo, MD   40 mg at 11/21/18 2034  . carbidopa-levodopa (SINEMET CR) 50-200 MG per tablet controlled release 1 tablet  1 tablet Oral QHS Karmen Bongo, MD   1 tablet at 11/21/18 2240  . carbidopa-levodopa (SINEMET IR) 25-250 MG per tablet immediate release 1 tablet  1 tablet Oral TID Karmen Bongo, MD   1 tablet at 11/22/18 0950  . clopidogrel (PLAVIX) tablet 75 mg  75 mg Oral Daily Rosalin Hawking, MD   75 mg at 11/22/18 1057  . enoxaparin (LOVENOX) injection 40 mg  40 mg Subcutaneous Q24H Karmen Bongo, MD   40 mg at 11/21/18 2239  . polyvinyl alcohol (LIQUIFILM TEARS) 1.4 % ophthalmic solution 1 drop  1 drop Both Eyes Daily PRN Karmen Bongo, MD      . senna-docusate (Senokot-S) tablet 1 tablet  1 tablet Oral QHS PRN Karmen Bongo, MD      . sodium chloride flush (NS) 0.9 % injection 3 mL  3 mL Intravenous Once Karmen Bongo, MD         Discharge Medications: Please see discharge summary for a list of discharge medications.  Relevant Imaging Results:  Relevant Lab Results:   Additional Information SS#: [id number]  Pollie Friar, RN

## 2018-11-22 NOTE — Evaluation (Signed)
Physical Therapy Evaluation Patient Details Name: Paul Ortiz MRN: [id number] DOB: 01/17/1946 Today's Date: 11/22/2018   History of Present Illness  73 yo male presenting with left facial droop and left UE weakness. PMH including Parkinson's, OSA, HLD, CAD, and HTN. CT and MRI showing no acute abnormalities.   Clinical Impression  Pt admitted with above. PTA pt was able to amb short distances in the home but required assist for ADLs. Pt now with hallucinations, L sided weakness, further impaired balance, inability to sequence steps for std pvt back to bed. Pt requiring maxA for sit to stand and std pvt. Pt with noted confusion and hallucinations as well. Acute PT to cont to follow. Recommend SNF at this time as pt has declined functionally and would benefit from rehab to achieve safe min guard level of function for safe transition home.    Follow Up Recommendations SNF;Supervision/Assistance - 24 hour    Equipment Recommendations  None recommended by PT    Recommendations for Other Services       Precautions / Restrictions Precautions Precautions: Fall Restrictions Weight Bearing Restrictions: No      Mobility  Bed Mobility Overal bed mobility: Needs Assistance Bed Mobility: Supine to Sit     Supine to sit: Max assist;HOB elevated     General bed mobility comments: Max A to bring BLEs towards EOB. Mod A for elevating trunk  Transfers Overall transfer level: Needs assistance Equipment used: Rolling walker (2 wheeled);1 person hand held assist Transfers: Sit to/from [employer] Transfers Sit to Stand: From elevated surface;Max assist(with RW) Stand pivot transfers: Max assist       General transfer comment: modA for foot placement to stand up, maxA to power up, modA fo move R hand from arm rest to chair, max verbal cues for sequencing stepping pattern during std pvt, shuffled gait pattern with L LE, pt able to minimally clear R LE, maxA for walker managment,  noted posterior lean and L lateral lean with onset of fatigue, required maxA to control descent sitting onto EOB  Ambulation/Gait             General Gait Details: limited to std pvt, pt fatiguing quickling and unable to sequence stepping  Stairs            Wheelchair Mobility    Modified Rankin (Stroke Patients Only) Modified Rankin (Stroke Patients Only) Pre-Morbid Rankin Score: Moderate disability Modified Rankin: Moderately severe disability     Balance Overall balance assessment: Needs assistance Sitting-balance support: No upper extremity supported;Feet supported Sitting balance-Leahy Scale: Poor Sitting balance - Comments: Decreased sitting balance when performing tasks at EOB Postural control: Posterior lean;Left lateral lean Standing balance support: Bilateral upper extremity supported;During functional activity Standing balance-Leahy Scale: Poor Standing balance comment: posterior lean and requiring Max A during pivot to recliner.                             Pertinent Vitals/Pain Pain Assessment: Faces Faces Pain Scale: No hurt Pain Intervention(s): Monitored during session;Limited activity within patient's tolerance;Repositioned    Home Living Family/patient expects to be discharged to:: Private residence Living Arrangements: Spouse/significant other("[name]") Available Help at Discharge: Family;Skilled Nursing Facility;Personal care attendant(PCA 3-5 hrs each day) Type of Home: House Home Access: Level entry     Home Layout: One level Home Equipment: Walker - 2 wheels;Shower seat - built in;Bedside commode(Lift chair)      Prior Function Level of Independence: Needs  assistance   Gait / Transfers Assistance Needed: Uses a RW in the house; requires increased assistance to standing from PCA or wife  ADL's / Homemaking Assistance Needed: Requires increased assistance for ADLs and IADLs; including self feeding at times        Hand  Dominance        Extremity/Trunk Assessment   Upper Extremity Assessment Upper Extremity Assessment: Generalized weakness    Lower Extremity Assessment Lower Extremity Assessment: LLE deficits/detail;RLE deficits/detail RLE Deficits / Details: generalized weakness LLE Deficits / Details: unable to follow commands for MMT however weaker than R during funtional observation, difficulty advancing during std pvt transfer    Cervical / Trunk Assessment Cervical / Trunk Assessment: Kyphotic;Other exceptions Cervical / Trunk Exceptions: Significant posterior lean  Communication   Communication: No difficulties  Cognition Arousal/Alertness: Awake/alert Behavior During Therapy: WFL for tasks assessed/performed Overall Cognitive Status: History of cognitive impairments - at baseline                                 General Comments: Pt with dementia and Parkinson's. Baseline not oriented to time, place, and situation. Pt stating it is 1975 and stating "I don't know what this place is." Wife confirming this is baseline. Pt following simple direct cues with increased time      General Comments General comments (skin integrity, edema, etc.): wife present and very concerned regarding d/c plan as she is unable to care for him    Exercises     Assessment/Plan    PT Assessment Patient needs continued PT services  PT Problem List Decreased strength;Decreased range of motion;Decreased activity tolerance;Decreased balance;Decreased mobility;Decreased coordination;Decreased cognition;Decreased knowledge of use of DME;Decreased safety awareness;Decreased knowledge of precautions       PT Treatment Interventions DME instruction;Gait training;Stair training;Functional mobility training;Therapeutic activities;Therapeutic exercise;Balance training;Neuromuscular re-education;Cognitive remediation;Patient/family education    PT Goals (Current goals can be found in the Care Plan section)   Acute Rehab PT Goals Patient Stated Goal: Wife, "get more help" PT Goal Formulation: With patient/family Time For Goal Achievement: 12/06/18 Potential to Achieve Goals: Fair    Frequency Min 3X/week   Barriers to discharge Decreased caregiver support spouse unable to physically provide more than min guard assist, pt has spouse every afternoon and night    Co-evaluation               AM-PAC PT "6 Clicks" Mobility  Outcome Measure Help needed turning from your back to your side while in a flat bed without using bedrails?: A Lot Help needed moving from lying on your back to sitting on the side of a flat bed without using bedrails?: A Lot Help needed moving to and from a bed to a chair (including a wheelchair)?: A Lot Help needed standing up from a chair using your arms (e.g., wheelchair or bedside chair)?: A Lot Help needed to walk in hospital room?: Total Help needed climbing 3-5 steps with a railing? : Total 6 Click Score: 10    End of Session Equipment Utilized During Treatment: Gait belt Activity Tolerance: Patient limited by fatigue Patient left: in bed;with bed alarm set;with family/visitor present Nurse Communication: Mobility status PT Visit Diagnosis: Unsteadiness on feet (R26.81);Muscle weakness (generalized) (M62.81);Difficulty in walking, not elsewhere classified (R26.2)    Time: [id number] PT Time Calculation (min) (ACUTE ONLY): 17 min   Charges:   PT Evaluation $PT Eval Moderate Complexity: 1 Mod  Kittie Plater, PT, DPT Acute Rehabilitation Services Pager #: [phone] Office #: [phone]   Berline Lopes 11/22/2018, 12:59 PM

## 2018-11-22 NOTE — Evaluation (Signed)
Occupational Therapy Evaluation Patient Details Name: Paul Ortiz MRN: [id number] DOB: [birth year] Today's Date: 11/22/2018    History of Present Illness 73 yo male presenting with left facial droop and left UE weakness. PMH including Parkinson's, OSA, HLD, CAD, and HTN. CT and MRI showing no acute abnormalities.     Clinical Impression   PTA, pt was living with his wife and required assistance for ADLs and functional transfers using RW. Pt with PCA who comes 3-5 hours each morning, and pt's wife reporting she is hoping for him to transition to [facility] for 24/7 care. Currently, pt requiring Max A for UB ADLs, Total A for LB ADLs, and Max A for functional transfers. Pt presenting with decreased balance, strength, cognition, vision, and safety. Pt would benefit from further acute OT to facilitate safe dc. Recommend dc to SNF for further OT to optimize safety, independence with ADLs, and return to PLOF.      Follow Up Recommendations  SNF;Supervision/Assistance - 24 hour(Preference for [facility])    Equipment Recommendations  Other (comment);3 in 1 bedside commode(Defer to next venue)    Recommendations for Other Services PT consult;Speech consult     Precautions / Restrictions Precautions Precautions: Fall      Mobility Bed Mobility Overal bed mobility: Needs Assistance Bed Mobility: Supine to Sit     Supine to sit: Max assist;HOB elevated     General bed mobility comments: Max A to bring BLEs towards EOB. Mod A for elevating trunk  Transfers Overall transfer level: Needs assistance Equipment used: Rolling walker (2 wheeled);1 person hand held assist Transfers: Sit to/from [employer] Sit to Stand: Mod assist;From elevated surface(with and without RW) Stand pivot transfers: Max assist       General transfer comment: Pt performing sit<>Stand with Mod A for power up. Once with RW and then second time without. Pt presenting with posterior lean  throughout. Requiring Max A for maintaining balance during pivot to recliner    Balance Overall balance assessment: Needs assistance Sitting-balance support: No upper extremity supported;Feet supported Sitting balance-Leahy Scale: Poor Sitting balance - Comments: Decreased sitting balance when performing tasks at EOB Postural control: Posterior lean;Left lateral lean Standing balance support: Bilateral upper extremity supported;During functional activity Standing balance-Leahy Scale: Poor Standing balance comment: posterior lean and requiring Max A during pivot to recliner.                           ADL either performed or assessed with clinical judgement   ADL Overall ADL's : Needs assistance/impaired Eating/Feeding: Moderate assistance;Bed level;Sitting;Cueing for sequencing Eating/Feeding Details (indicate cue type and reason): Pt able to eat breakfast sandwich ("finger foods") and requiring assistance for self feeding with utensils. Pt's wife assisting Grooming: Minimal assistance;Sitting;Bed level Grooming Details (indicate cue type and reason): Pt wiping his nose while seated at EOB, and required Min A for posterior and left lateral leam. Pt able to self correct balance and posture with cues, but then leaning as soon as he started grooming task again Upper Body Bathing: Maximal assistance;Sitting   Lower Body Bathing: Total assistance;Sit to/from stand   Upper Body Dressing : Maximal assistance;Sitting   Lower Body Dressing: Total assistance;Sit to/from stand   Toilet Transfer: Maximal assistance;Stand-pivot Toilet Transfer Details (indicate cue type and reason): Attempting to perform transfer with RW, but pt with decreased coorindation, motor planning, and balance. Pt with significant posterior lean in standing. Pt performing stand pivot with therapist providing  Max A for posterior lean. Requiring significant time for motor planning         Functional mobility  during ADLs: Maximal assistance(stand pivot only) General ADL Comments: Pt with decreased balance, cognition, safety, vision, and activity tolerance     Vision Baseline Vision/History: Wears glasses(Diplopia at baseline) Wears Glasses: At all times Patient Visual Report: Diplopia(Noting decreased depth perception) Vision Assessment?: Vision impaired- to be further tested in functional context     Perception     Praxis      Pertinent Vitals/Pain Pain Assessment: Faces Faces Pain Scale: No hurt Pain Intervention(s): Monitored during session;Limited activity within patient's tolerance;Repositioned     Hand Dominance     Extremity/Trunk Assessment Upper Extremity Assessment Upper Extremity Assessment: Generalized weakness   Lower Extremity Assessment Lower Extremity Assessment: Defer to PT evaluation   Cervical / Trunk Assessment Cervical / Trunk Assessment: Kyphotic;Other exceptions Cervical / Trunk Exceptions: Significant posterior lean   Communication Communication Communication: No difficulties   Cognition Arousal/Alertness: Awake/alert Behavior During Therapy: WFL for tasks assessed/performed Overall Cognitive Status: History of cognitive impairments - at baseline                                 General Comments: Pt with dementia and Parkinson's. Baseline not oriented to time, place, and situation. Pt stating it is 1975 and stating "I don't know what this place is." Wife confirming this is baseline. Pt following simple direct cues with increased time   General Comments  Wife present throughout    Exercises     Shoulder Instructions      Home Living Family/patient expects to be discharged to:: Private residence Living Arrangements: Spouse/significant other("Paul Ortiz") Available Help at Discharge: Family;Skilled Nursing Facility;Personal care attendant(PCA 3-5 hrs each day) Type of Home: House Home Access: Level entry     Home Layout: One level      Bathroom Shower/Tub: Occupational psychologist: Standard     Home Equipment: Environmental consultant - 2 wheels;Shower seat - built in;Bedside commode(Lift chair)          Prior Functioning/Environment Level of Independence: Needs assistance  Gait / Transfers Assistance Needed: Uses a RW in the house; requires increased assistance to standing from PCA or wife ADL's / Homemaking Assistance Needed: Requires increased assistance for ADLs and IADLs; including self feeding at times            OT Problem List: Decreased strength;Decreased range of motion;Decreased activity tolerance;Decreased knowledge of use of DME or AE;Decreased knowledge of precautions;Decreased cognition;Impaired balance (sitting and/or standing);Impaired vision/perception;Decreased coordination;Decreased safety awareness;Impaired UE functional use      OT Treatment/Interventions: Self-care/ADL training;Therapeutic exercise;Energy conservation;DME and/or AE instruction;Therapeutic activities;Patient/family education    OT Goals(Current goals can be found in the care plan section) Acute Rehab OT Goals Patient Stated Goal: Wife, "get more help" OT Goal Formulation: With patient Time For Goal Achievement: 12/06/18 Potential to Achieve Goals: Good  OT Frequency: Min 2X/week   Barriers to D/C:            Co-evaluation              AM-PAC OT "6 Clicks" Daily Activity     Outcome Measure Help from another person eating meals?: A Lot Help from another person taking care of personal grooming?: A Little Help from another person toileting, which includes using toliet, bedpan, or urinal?: A Lot Help from another person bathing (including washing, rinsing,  drying)?: A Lot Help from another person to put on and taking off regular upper body clothing?: A Lot Help from another person to put on and taking off regular lower body clothing?: Total 6 Click Score: 12   End of Session Equipment Utilized During Treatment: Gait  belt;Rolling walker Nurse Communication: Mobility status  Activity Tolerance: Patient tolerated treatment well;Patient limited by fatigue Patient left: in chair;with call bell/phone within reach;with chair alarm set;with family/visitor present  OT Visit Diagnosis: Unsteadiness on feet (R26.81);Other abnormalities of gait and mobility (R26.89);Muscle weakness (generalized) (M62.81);Other symptoms and signs involving cognitive function                Time: [id number] OT Time Calculation (min): 43 min Charges:  OT General Charges $OT Visit: 1 Visit OT Evaluation $OT Eval Moderate Complexity: 1 Mod OT Treatments $Self Care/Home Management : 23-37 mins  Saachi Zale MSOT, OTR/L Acute Rehab Pager: [phone] Office: [place] 11/22/2018, 10:44 AM

## 2018-11-23 ENCOUNTER — Telehealth: Payer: Self-pay | Admitting: *Deleted

## 2018-11-23 MED ORDER — SENNOSIDES-DOCUSATE SODIUM 8.6-50 MG PO TABS: 1.0000 | ORAL_TABLET | Freq: Every evening | ORAL | Status: AC | PRN

## 2018-11-23 MED ORDER — ASPIRIN 81 MG PO TBEC: 81.0000 mg | DELAYED_RELEASE_TABLET | Freq: Every day | ORAL | 0 refills | Status: AC

## 2018-11-23 MED ORDER — CARBIDOPA-LEVODOPA 25-100 MG PO TABS: 2.0000 | ORAL_TABLET | Freq: Three times a day (TID) | ORAL | 0 refills | Status: DC

## 2018-11-23 MED ORDER — DICLOFENAC SODIUM 1 % TD GEL
2.0000 g | Freq: Four times a day (QID) | TRANSDERMAL | Status: DC
  Administered 2018-11-23: 2 g via TOPICAL
  Filled 2018-11-23: qty 100

## 2018-11-23 MED ORDER — ATORVASTATIN CALCIUM 40 MG PO TABS: 40.0000 mg | ORAL_TABLET | Freq: Every day | ORAL | 1 refills | Status: AC

## 2018-11-23 MED ORDER — CLOPIDOGREL BISULFATE 75 MG PO TABS: 75.0000 mg | ORAL_TABLET | Freq: Every day | ORAL | 1 refills | Status: AC

## 2018-11-23 MED FILL — CARBIDOPA/LEVO 25/100 TAB: 25-100 | 30 days supply | Qty: 180 | Fill #0

## 2018-11-23 MED FILL — CLOPIDOGREL 75 MG TABLET: 75 | 30 days supply | Qty: 30 | Fill #0

## 2018-11-23 MED FILL — ATORVASTATIN CALCIUM 40 MG: 40 | 30 days supply | Qty: 30 | Fill #0

## 2018-11-23 NOTE — Telephone Encounter (Signed)
Received a call from patient's grand-daughter stating that patient is being discharged home today from the hospital, and his wife [name] would like to schedule a visit next week and has some questions. Visit scheduled for Monday 11/27/18 at 11:00 am.

## 2018-11-23 NOTE — Discharge Summary (Signed)
Physician Discharge Summary  Paul Ortiz [id number] DOB: 06-29-1945 DOA: 11/21/2018  PCP: Shirline Frees, MD  Admit date: 11/21/2018 Discharge date: 11/23/2018  Time spent: 45 minutes  Recommendations for Outpatient Follow-up:  1. Follow up with neurology in 2-3 weeks for evaluation of symptoms 2. [place] services   Discharge Diagnoses:  Principal Problem:   TIA (transient ischemic attack) Active Problems:   Hallucinations   Acute encephalopathy   Essential hypertension   Parkinson disease ([place])   Hyperlipidemia   Discharge Condition: stable  Diet recommendation: regular  Filed Weights   11/21/18 1252  Weight: 76.8 kg    History of present illness:  Paul Ortiz is a 73 y.o. male with medical history significant of Parkinson's; OSA; HLD; CAD; and HTN presented 10/20 with code stroke. That morning between 1030-11, he started drooling so bad he wet his shirt.  His left arm was weak.  He had left facial droop.  He has chronic weakness and rigidity without tremor.  [facility] and PT was there  and the therapist was concerned this was a stroke.  He improved while the PT was there and so did not clearly recommend ER evaluation.  Symptoms have essentially resolved.  He had been having severe hallucinations; he was given Seroquel but this seemed to make him worse.  Previously, he was seeing people in the house but now he is seeing snakes on him and big worms - he has not had Seroquel of late.   Hospital Course:  TIA. Presented with left sided weakness, facial droop confusion. At admission no facial droop. Patient with h/o Parkinson's and suspect symptoms indication of disease progression.  TIA workup with CT head no acute abnormality, MRI brain no acute abnormality, echo with EF 60%, LDL 148, HgA1c 5.1. continues with generalized weakness and confusion. Evaluated by neuro who recommend 81mg  asa and plavix for 3 weeks then plavix alone. Evaluated by PT who recommend snf. Initially wife  requested/demanded placement as she felt overwhelmed and unable to provide care. This am however she requests to take patient home and care for him at home. Home health has been ordered,  Acute encephalopathy/hallucination. Has been going on for awhile per wife. Likely Parkinson's progression and sinemet can make this worse.  discussed with primary neurologist who agreed to decreasing sinemet noting this may increase stiffness. No metabolic derangement, no signs infection.  HTN  Fair control. Home meds includeToprol XL.  HLDLipitor 40 mg daily   Parkinson's see #2. Enrolled in palliative care at home. Code Status: dnr  Procedures:    Consultations:    Discharge Exam: Vitals:   11/23/18 0852 11/23/18 1209  BP: (!) 145/76 (!) 116/95  Pulse: 93 93  Resp: 20 14  Temp: 98.4 F (36.9 C) 98.6 F (37 C)  SpO2: 96% 95%    General: thin somewhat frail appearing no acute distress Cardiovascular: rrr no mgr no le edema Respiratory: normal effort BS clear bilaterally no wheeze  Discharge Instructions   Discharge Instructions    Call MD for:  difficulty breathing, headache or visual disturbances   Complete by: As directed    Call MD for:  persistant nausea and vomiting   Complete by: As directed    Call MD for:  temperature >100.4   Complete by: As directed    Diet - low sodium heart healthy   Complete by: As directed    Discharge instructions   Complete by: As directed    Take medications as prescribed Follow up  with neuro, either Dr. Carles Collet or [facility] in 2-3 weeks for evaluation of symptoms Have ordered [facility] PT/OT/RN/SW/aid   Increase activity slowly   Complete by: As directed      Allergies as of 11/23/2018      Reactions   Lactose Intolerance (gi) Diarrhea   Penicillins Rash      Medication List    STOP taking these medications   aspirin 325 MG tablet Replaced by: aspirin 81 MG EC tablet   carbidopa-levodopa 25-250 MG tablet Commonly known as: SINEMET  IR Replaced by: carbidopa-levodopa 25-100 MG tablet You also have another medication with the same name that you need to continue taking as instructed.     TAKE these medications   alfuzosin 10 MG 24 hr tablet Commonly known as: UROXATRAL Take 10 mg by mouth at bedtime.   ALPRAZolam 1 MG tablet Commonly known as: XANAX Take 1 mg by mouth at bedtime.   aspirin 81 MG EC tablet Take 1 tablet (81 mg total) by mouth daily for 21 days. Start taking on: November 24, 2018 Replaces: aspirin 325 MG tablet   atorvastatin 40 MG tablet Commonly known as: LIPITOR Take 1 tablet (40 mg total) by mouth daily at 6 PM.   carbidopa-levodopa 50-200 MG tablet Commonly known as: SINEMET CR Take 1 tablet by mouth at bedtime. What changed:   Another medication with the same name was added. Make sure you understand how and when to take each.  Another medication with the same name was removed. Continue taking this medication, and follow the directions you see here.   carbidopa-levodopa 25-100 MG tablet Commonly known as: SINEMET IR Take 2 tablets by mouth 3 (three) times daily. What changed: You were already taking a medication with the same name, and this prescription was added. Make sure you understand how and when to take each. Replaces: carbidopa-levodopa 25-250 MG tablet   clopidogrel 75 MG tablet Commonly known as: PLAVIX Take 1 tablet (75 mg total) by mouth daily. Start taking on: November 24, 2018   diclofenac sodium 1 % Gel Commonly known as: VOLTAREN Apply 1 g topically 4 (four) times daily as needed (pain on thighs, lower back, and hips).   Fish Oil 1000 MG Caps Take 2,000 mg by mouth daily.   ibuprofen 200 MG tablet Commonly known as: ADVIL Take 400 mg by mouth every 6 (six) hours as needed for moderate pain (rotates with tylenol every 3 hours at meal times and bedtime). Take 2 tab daily   ipratropium 0.06 % nasal spray Commonly known as: ATROVENT Place 2 sprays into the nose  as needed.   lidocaine 2 % solution Commonly known as: XYLOCAINE Use as directed 5-10 mLs in the mouth or throat daily. Swish and spit out   Magnesium 500 MG Caps Take 250 mg by mouth daily. Taking at noon   metoprolol succinate 25 MG 24 hr tablet Commonly known as: TOPROL-XL Take 12.5 mg by mouth 2 (two) times daily. Half in am and half at night   ondansetron 8 MG tablet Commonly known as: ZOFRAN Take 8 mg by mouth every 8 (eight) hours as needed for nausea.   potassium chloride 10 MEQ tablet Commonly known as: KLOR-CON Take 5 mEq by mouth daily.   senna-docusate 8.6-50 MG tablet Commonly known as: Senokot-S Take 1 tablet by mouth at bedtime as needed for mild constipation.   Soothe 0.6-0.6 % Soln Generic drug: Propylene Glycol-Glycerin Place 1 drop into both eyes daily as needed (dry eyes).  TYLENOL 500 MG tablet Generic drug: acetaminophen Take 1,000 mg by mouth every 6 (six) hours as needed. Alternating with 2 (ibuprofen 200mg  ) every 3 hours at meal times and Bedtime.   vitamin B-12 1000 MCG tablet Commonly known as: CYANOCOBALAMIN Take 1,000 mcg by mouth daily.   Vitamin D-1000 Max St 25 MCG (1000 UT) tablet Generic drug: Cholecalciferol Take 1 tablet by mouth daily.            Durable Medical Equipment  (From admission, onward)         Start     Ordered   11/23/18 1030  For home use only DME 3 n 1  Once     11/23/18 1030         Allergies  Allergen Reactions  . Lactose Intolerance (Gi) Diarrhea  . Penicillins Rash      The results of significant diagnostics from this hospitalization (including imaging, microbiology, ancillary and laboratory) are listed below for reference.    Significant Diagnostic Studies: Ct Angio Head W Or Wo Contrast  Result Date: 11/21/2018 CLINICAL DATA:  Focal neuro deficit for less than 6 hours, stroke suspected. Last seen normal 2 hours ago. Left facial droop. Aphasia. Left-sided weakness. EXAM: CT ANGIOGRAPHY  HEAD AND NECK TECHNIQUE: Multidetector CT imaging of the head and neck was performed using the standard protocol during bolus administration of intravenous contrast. Multiplanar CT image reconstructions and MIPs were obtained to evaluate the vascular anatomy. Carotid stenosis measurements (when applicable) are obtained utilizing NASCET criteria, using the distal internal carotid diameter as the denominator. CONTRAST:  18mL OMNIPAQUE IOHEXOL 350 MG/ML SOLN COMPARISON:  CTA of the head and neck 06/14/2018 FINDINGS: CTA NECK FINDINGS Aortic arch: A 3 vessel arch configuration is present. Atherosclerotic changes are noted at the arch without significant stenosis or aneurysm. Right carotid system: The right common carotid artery is within normal limits. Minimal atherosclerotic changes noted at the right carotid bifurcation. The cervical right ICA is normal. Left carotid system: The left common carotid artery is within normal limits. Mild atherosclerotic changes are noted at the bifurcation. There is no significant stenosis. Cervical left ICA is normal. Vertebral arteries: The left vertebral artery is slightly dominant to the right. Both vertebral arteries originate from the subclavian arteries without significant stenosis. There is some calcification at the origin of the left vertebral artery. There is no significant stenosis of either vertebral artery in the neck. 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 Vertebral body heights and alignment are maintained. No focal lytic or blastic lesions are present otherwise. Other neck: Dominant right thyroid nodule measures 12.5 cm maximally. Other smaller lesions are present and stable. No significant adenopathy is present. Salivary glands and ducts are normal. Upper chest: The lung apices are clear. Thoracic inlet is within normal limits. Review of the MIP images confirms the above findings CTA HEAD FINDINGS Anterior circulation: Atherosclerotic calcifications are present  within the cavernous internal carotid arteries bilaterally without a significant stenosis through the ICA termini. There is no significant interval change. The ICA termini are within normal limits. There is moderate to severe stenosis of the more distal left A2 segment. Posterior circulation: M1 segments are within normal limits. MCA bifurcations are intact. Distal segmental small vessel disease is present without other significant proximal stenosis or branch vessel occlusion. Venous sinuses: The left vertebral artery is dominant. Atherosclerotic changes are noted at the dural margin without significant stenosis. Vertebrobasilar junction is normal. The basilar artery is normal. Both posterior cerebral arteries  originates from the basilar tip. There is some narrowing of distal PCA branch vessels without a significant proximal stenosis or occlusion. Anatomic variants: None Review of the MIP images confirms the above findings IMPRESSION: 1. No significant proximal stenosis, aneurysm, or branch vessel occlusion within the Circle of Willis. 2. Mild atherosclerotic changes at the carotid bifurcations bilaterally without significant stenosis. 3. Atherosclerotic changes within the cavernous internal carotid arteries bilaterally without significant stenosis. 4. Moderate to severe stenosis of the more distal left A2 segment. 5. Mild diffuse medium and small vessel disease compatible with intracranial atherosclerosis. 6. Dominant right thyroid nodule measures 12.5 cm maximally. This does not meet criteria for further imaging. Recommend no additional imaging. Electronically Signed   By: San Morelle M.D.   On: 11/21/2018 13:16   Ct Angio Neck W Or Wo Contrast  Result Date: 11/21/2018 CLINICAL DATA:  Focal neuro deficit for less than 6 hours, stroke suspected. Last seen normal 2 hours ago. Left facial droop. Aphasia. Left-sided weakness. EXAM: CT ANGIOGRAPHY HEAD AND NECK TECHNIQUE: Multidetector CT imaging of the  head and neck was performed using the standard protocol during bolus administration of intravenous contrast. Multiplanar CT image reconstructions and MIPs were obtained to evaluate the vascular anatomy. Carotid stenosis measurements (when applicable) are obtained utilizing NASCET criteria, using the distal internal carotid diameter as the denominator. CONTRAST:  115mL OMNIPAQUE IOHEXOL 350 MG/ML SOLN COMPARISON:  CTA of the head and neck 06/14/2018 FINDINGS: CTA NECK FINDINGS Aortic arch: A 3 vessel arch configuration is present. Atherosclerotic changes are noted at the arch without significant stenosis or aneurysm. Right carotid system: The right common carotid artery is within normal limits. Minimal atherosclerotic changes noted at the right carotid bifurcation. The cervical right ICA is normal. Left carotid system: The left common carotid artery is within normal limits. Mild atherosclerotic changes are noted at the bifurcation. There is no significant stenosis. Cervical left ICA is normal. Vertebral arteries: The left vertebral artery is slightly dominant to the right. Both vertebral arteries originate from the subclavian arteries without significant stenosis. There is some calcification at the origin of the left vertebral artery. There is no significant stenosis of either vertebral artery in the neck. 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 Vertebral body heights and alignment are maintained. No focal lytic or blastic lesions are present otherwise. Other neck: Dominant right thyroid nodule measures 12.5 cm maximally. Other smaller lesions are present and stable. No significant adenopathy is present. Salivary glands and ducts are normal. Upper chest: The lung apices are clear. Thoracic inlet is within normal limits. Review of the MIP images confirms the above findings CTA HEAD FINDINGS Anterior circulation: Atherosclerotic calcifications are present within the cavernous internal carotid arteries bilaterally  without a significant stenosis through the ICA termini. There is no significant interval change. The ICA termini are within normal limits. There is moderate to severe stenosis of the more distal left A2 segment. Posterior circulation: M1 segments are within normal limits. MCA bifurcations are intact. Distal segmental small vessel disease is present without other significant proximal stenosis or branch vessel occlusion. Venous sinuses: The left vertebral artery is dominant. Atherosclerotic changes are noted at the dural margin without significant stenosis. Vertebrobasilar junction is normal. The basilar artery is normal. Both posterior cerebral arteries originates from the basilar tip. There is some narrowing of distal PCA branch vessels without a significant proximal stenosis or occlusion. Anatomic variants: None Review of the MIP images confirms the above findings IMPRESSION: 1. No significant proximal stenosis,  aneurysm, or branch vessel occlusion within the Circle of Willis. 2. Mild atherosclerotic changes at the carotid bifurcations bilaterally without significant stenosis. 3. Atherosclerotic changes within the cavernous internal carotid arteries bilaterally without significant stenosis. 4. Moderate to severe stenosis of the more distal left A2 segment. 5. Mild diffuse medium and small vessel disease compatible with intracranial atherosclerosis. 6. Dominant right thyroid nodule measures 12.5 cm maximally. This does not meet criteria for further imaging. Recommend no additional imaging. Electronically Signed   By: San Morelle M.D.   On: 11/21/2018 13:16   Mr Brain Wo Contrast  Result Date: 11/22/2018 CLINICAL DATA:  73 year old male code stroke presentation yesterday with left facial droop and aphasia. EXAM: MRI HEAD WITHOUT CONTRAST TECHNIQUE: Multiplanar, multiecho pulse sequences of the brain and surrounding structures were obtained without intravenous contrast. COMPARISON:  CT head, CTA head and  neck yesterday. FINDINGS: Brain: No restricted diffusion or evidence of acute infarction. The there are chronic lacunar infarcts in both thalami, the right basal ganglia, and central pons. Superimposed patchy and confluent bilateral cerebral white matter T2 and FLAIR hyperintensity. No chronic cerebral blood products identified. No cortical encephalomalacia. Comparatively mild T2 heterogeneity in the left basal ganglia. Cerebellum appears negative. No midline shift, mass effect, evidence of mass lesion, ventriculomegaly, extra-axial collection or acute intracranial hemorrhage. Cervicomedullary junction and pituitary are within normal limits. Vascular: Major intracranial vascular flow voids are preserved. There is mild generalized intracranial artery dolichoectasia. Skull and upper cervical spine: Negative visible cervical spine. Normal bone marrow signal. Sinuses/Orbits: Postoperative changes to both globes. Paranasal sinuses and mastoids are stable and well pneumatized. Other: Visible internal auditory structures appear normal. Scalp and face soft tissues appear negative. IMPRESSION: 1. No acute intracranial abnormality. 2. Advanced chronic small vessel disease. Electronically Signed   By: Genevie Ann M.D.   On: 11/22/2018 08:14   Ct Head Code Stroke Wo Contrast  Result Date: 11/21/2018 CLINICAL DATA:  Code stroke. Possible stroke. Left facial droop, aphasia EXAM: CT HEAD WITHOUT CONTRAST TECHNIQUE: Contiguous axial images were obtained from the base of the skull through the vertex without intravenous contrast. COMPARISON:  CT head 02/16/2018 FINDINGS: Brain: Generalized atrophy. Diffuse white matter hypodensity is chronic and stable. This extends into the internal capsule bilaterally. Chronic infarct left thalamus. Negative for acute infarct.  Negative for acute hemorrhage or mass. Vascular: Negative for hyperdense vessel. Atherosclerotic calcification cavernous carotid bilaterally. Skull: Negative  Sinuses/Orbits: Paranasal sinuses clear bilaterally. Bilateral cataract surgery. Other: None ASPECTS ([place] Stroke Program Early CT Score) - Ganglionic level infarction (caudate, lentiform nuclei, internal capsule, insula, M1-M3 cortex): 7 - Supraganglionic infarction (M4-M6 cortex): 3 Total score (0-10 with 10 being normal): 10 IMPRESSION: 1. No acute intracranial abnormality 2. ASPECTS is 10 3. Moderate chronic microvascular ischemic change. 4. Results texted to Dr. Cheral Marker. Electronically Signed   By: Franchot Gallo M.D.   On: 11/21/2018 12:39    Microbiology: Recent Results (from the past 240 hour(s))  SARS CORONAVIRUS 2 (TAT 6-24 HRS) Nasopharyngeal Nasopharyngeal Swab     Status: None   Collection Time: 11/21/18  2:51 PM   Specimen: Nasopharyngeal Swab  Result Value Ref Range Status   SARS Coronavirus 2 NEGATIVE NEGATIVE Final    Comment: (NOTE) SARS-CoV-2 target nucleic acids are NOT DETECTED. The SARS-CoV-2 RNA is generally detectable in upper and lower respiratory specimens during the acute phase of infection. Negative results do not preclude SARS-CoV-2 infection, do not rule out co-infections with other pathogens, and should not be used as the  sole basis for treatment or other patient management decisions. Negative results must be combined with clinical observations, patient history, and epidemiological information. The expected result is Negative. Fact Sheet for Patients: SugarRoll.be Fact Sheet for Healthcare Providers: https://www.woods-mathews.com/ This test is not yet approved or cleared by the [place] FDA and  has been authorized for detection and/or diagnosis of SARS-CoV-2 by FDA under an Emergency Use Authorization (EUA). This EUA will remain  in effect (meaning this test can be used) for the duration of the COVID-19 declaration under Section 56 4(b)(1) of the Act, 21 U.S.C. section 360bbb-3(b)(1), unless the  authorization is terminated or revoked sooner. Performed at [facility], [place] [address] [place], [place] [address]      Labs: Basic Metabolic Panel: Recent Labs  Lab 11/21/18 1220 11/21/18 1225  NA 143 144  K 4.2 4.3  CL 110 108  CO2 24  --   GLUCOSE 106* 105*  BUN 34* 39*  CREATININE 0.91 0.90  CALCIUM 9.3  --    Liver Function Tests: Recent Labs  Lab 11/21/18 1220  AST 23  ALT 10  ALKPHOS 57  BILITOT 0.7  PROT 6.6  ALBUMIN 3.7   No results for input(s): LIPASE, AMYLASE in the last 168 hours. No results for input(s): AMMONIA in the last 168 hours. CBC: Recent Labs  Lab 11/21/18 1220 11/21/18 1225  WBC 4.8  --   NEUTROABS 3.5  --   HGB 13.8 13.6  HCT 42.1 40.0  MCV 97.7  --   PLT 201  --    Cardiac Enzymes: Recent Labs  Lab 11/21/18 2102  CKTOTAL 32*   BNP: BNP (last 3 results) No results for input(s): BNP in the last 8760 hours.  ProBNP (last 3 results) No results for input(s): PROBNP in the last 8760 hours.  CBG: No results for input(s): GLUCAP in the last 168 hours.     SignedRadene Gunning NP  [facility] 11/23/2018, 12:54 PM

## 2018-11-23 NOTE — Progress Notes (Signed)
Patient and wife given discharge instructions and teaching. No new questions or concerns. IV and tele removed. [facility] to transport home.

## 2018-11-23 NOTE — TOC Transition Note (Signed)
Transition of Care [facility]) - CM/SW Discharge Note   Patient Details  Name: Paul Ortiz MRN: [id number] Date of Birth: 04/08/1945  Transition of Care [facility]) CM/SW Contact:  Pollie Friar, RN Phone Number: 11/23/2018, 1:17 PM   Clinical Narrative:    Pt's wife has decided to have pt d/c home and will increase his aide services at home.  Pt was active with [facility] prior to admission and they want to continue with them. Tiffany with [facility] aware.  Wife requesting [facility] home. CM called and scheduled pickup. Bedside RN updated and d/c packet at the desk.    Final next level of care: Home w Home Health Services Barriers to Discharge: No Barriers Identified   Patient Goals and CMS Choice   CMS Medicare.gov Compare Post Acute Care list provided to:: Patient Represenative (must comment) Choice offered to / list presented to : Spouse  Discharge Placement                Patient to be transferred to facility by: PTAR home      Discharge Plan and Services In-house Referral: Clinical Social Work Discharge Planning Services: CM Consult                      HH Arranged: PT, OT, RN, Nurse's Aide, Social Work [employer] Agency: [facility] [facility] [employer] Date [place]: 11/23/18   Representative spoke with at [place]: [place] ([place]) Interventions     Readmission Risk Interventions No flowsheet data found.

## 2018-11-24 DIAGNOSIS — G20 Parkinson's disease: Secondary | ICD-10-CM | POA: Diagnosis not present

## 2018-11-24 DIAGNOSIS — I10 Essential (primary) hypertension: Secondary | ICD-10-CM | POA: Diagnosis not present

## 2018-11-24 DIAGNOSIS — N401 Enlarged prostate with lower urinary tract symptoms: Secondary | ICD-10-CM | POA: Diagnosis not present

## 2018-11-24 DIAGNOSIS — I679 Cerebrovascular disease, unspecified: Secondary | ICD-10-CM | POA: Diagnosis not present

## 2018-11-24 DIAGNOSIS — F419 Anxiety disorder, unspecified: Secondary | ICD-10-CM | POA: Diagnosis not present

## 2018-11-27 ENCOUNTER — Other Ambulatory Visit: Payer: Medicare Other | Admitting: *Deleted

## 2018-11-27 ENCOUNTER — Other Ambulatory Visit: Payer: Self-pay | Admitting: Internal Medicine

## 2018-11-27 ENCOUNTER — Other Ambulatory Visit: Payer: Self-pay

## 2018-11-27 DIAGNOSIS — E559 Vitamin D deficiency, unspecified: Secondary | ICD-10-CM | POA: Diagnosis not present

## 2018-11-27 DIAGNOSIS — I2510 Atherosclerotic heart disease of native coronary artery without angina pectoris: Secondary | ICD-10-CM | POA: Diagnosis not present

## 2018-11-27 DIAGNOSIS — Z515 Encounter for palliative care: Secondary | ICD-10-CM

## 2018-11-27 DIAGNOSIS — G20 Parkinson's disease: Secondary | ICD-10-CM | POA: Diagnosis not present

## 2018-11-27 DIAGNOSIS — N400 Enlarged prostate without lower urinary tract symptoms: Secondary | ICD-10-CM | POA: Diagnosis not present

## 2018-11-27 DIAGNOSIS — I10 Essential (primary) hypertension: Secondary | ICD-10-CM | POA: Diagnosis not present

## 2018-11-27 DIAGNOSIS — G4733 Obstructive sleep apnea (adult) (pediatric): Secondary | ICD-10-CM | POA: Diagnosis not present

## 2018-11-27 NOTE — Progress Notes (Signed)
COMMUNITY PALLIATIVE CARE RN NOTE  PATIENT NAME: Paul Ortiz DOB: 1945/08/30 MRN: [id number]  PRIMARY CARE PROVIDER: Shirline Frees, MD  RESPONSIBLE PARTY:  Acct ID - Guarantor Home Phone Work Phone Relationship Acct Type  [id number] OMAR, [id number] Self P/F     [place], [place] [id number]   Covid-19 Pre-screening Negative  PLAN OF CARE and INTERVENTION:  1. ADVANCE CARE PLANNING/GOALS OF CARE: Goal is for patient to remain at home with his wife and avoid hospitalizations. He wants to get stronger. 2. PATIENT/CAREGIVER EDUCATION: Pain Management, Safe Mobility/Transfers 3. DISEASE STATUS: Met with patient and his wife in their home. Hired caregiver also present during visit. Patient was recently admitted to the hospital from 10/20 - 10/22. Wife says that patient's mouth began to droop on the L side, he started to drool and was unable to move his L arm or L foot. He was diagnosed with a TIA. He has been placed on Plavix 75 mg daily. In the past, he has experienced increased hallucinations, seeing snakes and worms and people inside or outside of his home. His Seroquel has been discontinued. Also there have been changes made to his Carbidopa-Levodopa. Since he has returned home, he initially slept much better and did not c/o any hallucinations. However, last night he woke up stating that he felt trapped in his bed and wanted to get up around 3am and get into the recliner. He was seeing worms crawling on his legs during visit today. Wife does admit that hallucinations have not been as severe since returning home. He reports having a mild headache at this time, but is tolerable. Wife also states that patient has not been urinating as much over the past few days. While in the hospital, he required an in and out cath to empty his bladder and 800 cc came out. She has been advised to take patient to the Urgent care nearby if he has no urine output within a 12 hour  period. He has urinated twice over the past 12 hours. His intake remains good. He is eating 3 meals/day plus snacks. He occasionally drinks Boost for nutritional supplementation. He requires 1 person assistance with ambulation, bathing and dressing. He is able to feed himself for the most part, but may need assistance for some things. He was able to take his medications one by one as caregiver handed them to him. No coughing noted. He uses a urinal during the night. It was recommended at the hospital that patient go to a SNF for rehab, but wife wanted patient to return home. She is awaiting PT to restart with [facility], but since he was hospitalized they must obtain new orders and approval. Her hired caregiver service hours have been increased to 21 hours per week so she now has assistance over the weekend. Will continue to monitor.    HISTORY OF PRESENT ILLNESS: This is a 73 yo male who resides at home with his wife. Palliative care team continues to follow patient. Will continue to visit patient monthly and PRN.   CODE STATUS: DNR ADVANCED DIRECTIVES: Y MOST FORM: no PPS: 30%   PHYSICAL EXAM:   VITALS: Today's Vitals   11/27/18 1222  BP: 137/75  Pulse: 66  Resp: 16  Temp: 97.9 F (36.6 C)  TempSrc: Temporal  SpO2: 97%  PainSc: 2   PainLoc: Head    LUNGS: clear to auscultation  CARDIAC: Cor RRR EXTREMITIES: No edema SKIN: No skin issues  NEURO: Alert  and oriented to self, intermittent confusion, generalized weakness, ambulatory with walker and 1 person assistance   (Duration of visit and documentation 90 minutes)   Daryl Eastern, RN BSN

## 2018-11-29 ENCOUNTER — Emergency Department (HOSPITAL_COMMUNITY): Payer: Medicare Other

## 2018-11-29 ENCOUNTER — Other Ambulatory Visit: Payer: Self-pay

## 2018-11-29 ENCOUNTER — Encounter (HOSPITAL_COMMUNITY): Payer: Self-pay | Admitting: Emergency Medicine

## 2018-11-29 ENCOUNTER — Emergency Department (HOSPITAL_COMMUNITY)
Admission: EM | Admit: 2018-11-29 | Discharge: 2018-11-29 | Disposition: A | Discharge status: Home / Self Care | Payer: Medicare Other | Attending: Emergency Medicine | Admitting: Emergency Medicine

## 2018-11-29 DIAGNOSIS — Z7982 Long term (current) use of aspirin: Secondary | ICD-10-CM | POA: Insufficient documentation

## 2018-11-29 DIAGNOSIS — Z8673 Personal history of transient ischemic attack (TIA), and cerebral infarction without residual deficits: Secondary | ICD-10-CM | POA: Insufficient documentation

## 2018-11-29 DIAGNOSIS — R519 Headache, unspecified: Secondary | ICD-10-CM | POA: Diagnosis not present

## 2018-11-29 DIAGNOSIS — I10 Essential (primary) hypertension: Secondary | ICD-10-CM | POA: Diagnosis not present

## 2018-11-29 DIAGNOSIS — Z7902 Long term (current) use of antithrombotics/antiplatelets: Secondary | ICD-10-CM | POA: Diagnosis not present

## 2018-11-29 DIAGNOSIS — R42 Dizziness and giddiness: Secondary | ICD-10-CM | POA: Diagnosis not present

## 2018-11-29 DIAGNOSIS — G20 Parkinson's disease: Secondary | ICD-10-CM | POA: Diagnosis not present

## 2018-11-29 DIAGNOSIS — I959 Hypotension, unspecified: Secondary | ICD-10-CM | POA: Insufficient documentation

## 2018-11-29 DIAGNOSIS — R4182 Altered mental status, unspecified: Secondary | ICD-10-CM | POA: Diagnosis not present

## 2018-11-29 DIAGNOSIS — Z79899 Other long term (current) drug therapy: Secondary | ICD-10-CM | POA: Insufficient documentation

## 2018-11-29 DIAGNOSIS — Z951 Presence of aortocoronary bypass graft: Secondary | ICD-10-CM | POA: Insufficient documentation

## 2018-11-29 LAB — COMPREHENSIVE METABOLIC PANEL
ALT: 13 U/L (ref 0–44)
AST: 21 U/L (ref 15–41)
Albumin: 3.3 g/dL — ABNORMAL LOW (ref 3.5–5.0)
Alkaline Phosphatase: 55 U/L (ref 38–126)
Anion gap: 9 (ref 5–15)
BUN: 32 mg/dL — ABNORMAL HIGH (ref 8–23)
CO2: 25 mmol/L (ref 22–32)
Calcium: 8.9 mg/dL (ref 8.9–10.3)
Chloride: 108 mmol/L (ref 98–111)
Creatinine, Ser: 0.98 mg/dL (ref 0.61–1.24)
GFR calc Af Amer: >60 mL/min (ref >60)
GFR calc non Af Amer: >60 mL/min (ref >60)
Glucose, Bld: 121 mg/dL — ABNORMAL HIGH (ref 70–99)
Potassium: 3.8 mmol/L (ref 3.5–5.1)
Sodium: 142 mmol/L (ref 135–145)
Total Bilirubin: 0.5 mg/dL (ref 0.3–1.2)
Total Protein: 6.1 g/dL — ABNORMAL LOW (ref 6.5–8.1)

## 2018-11-29 LAB — URINALYSIS, ROUTINE W REFLEX MICROSCOPIC
Bilirubin Urine: NEGATIVE
Glucose, UA: NEGATIVE mg/dL
Hgb urine dipstick: NEGATIVE
Ketones, ur: 5 mg/dL — AB
Leukocytes,Ua: NEGATIVE
Nitrite: NEGATIVE
Protein, ur: NEGATIVE mg/dL
Specific Gravity, Urine: 1.02 (ref 1.005–1.030)
pH: 6 (ref 5.0–8.0)

## 2018-11-29 LAB — CBC WITH DIFFERENTIAL/PLATELET
Abs Immature Granulocytes: 0.01 10*3/uL (ref 0.00–0.07)
Basophils Absolute: 0 10*3/uL (ref 0.0–0.1)
Basophils Relative: 0 %
Eosinophils Absolute: 0.1 10*3/uL (ref 0.0–0.5)
Eosinophils Relative: 1 %
HCT: 38.7 % — ABNORMAL LOW (ref 39.0–52.0)
Hemoglobin: 12.5 g/dL — ABNORMAL LOW (ref 13.0–17.0)
Immature Granulocytes: 0 %
Lymphocytes Relative: 10 %
Lymphs Abs: 0.6 10*3/uL — ABNORMAL LOW (ref 0.7–4.0)
MCH: 31.7 pg (ref 26.0–34.0)
MCHC: 32.3 g/dL (ref 30.0–36.0)
MCV: 98.2 fL (ref 80.0–100.0)
Monocytes Absolute: 0.4 10*3/uL (ref 0.1–1.0)
Monocytes Relative: 6 %
Neutro Abs: 5.1 10*3/uL (ref 1.7–7.7)
Neutrophils Relative %: 83 %
Platelets: 185 10*3/uL (ref 150–400)
RBC: 3.94 MIL/uL — ABNORMAL LOW (ref 4.22–5.81)
RDW: 12.6 % (ref 11.5–15.5)
WBC: 6.1 10*3/uL (ref 4.0–10.5)
nRBC: 0 % (ref 0.0–0.2)

## 2018-11-29 LAB — LIPASE, BLOOD: Lipase: 36 U/L (ref 11–51)

## 2018-11-29 MED ORDER — SODIUM CHLORIDE 0.9 % IV BOLUS
1000.0000 mL | Freq: Once | INTRAVENOUS | Status: AC
  Administered 2018-11-29: 1000 mL via INTRAVENOUS

## 2018-11-29 NOTE — ED Notes (Signed)
Pt was I&O cathed, produced about 100 ml of urine. After I&O, pt said he "felt like he needed to pee", pt urinated 200 ml after I&O

## 2018-11-29 NOTE — ED Notes (Signed)
Pt's wife gave pt water and sandwich

## 2018-11-29 NOTE — ED Notes (Addendum)
Pt verbalized understanding of discharge instructions, wife confirmed they will follow up with PCP. Pt reports feeling much better after eating. No further questions at this time.

## 2018-11-29 NOTE — ED Notes (Signed)
Patient transported to CT 

## 2018-11-29 NOTE — ED Notes (Signed)
X-ray at bedside

## 2018-11-29 NOTE — Discharge Instructions (Signed)
Stay hydrated   Stop metoprolol for now   See your doctor in a week   Return to ER if you have abdominal pain, vomiting, trouble breathing, passing out, weakness, trouble speaking

## 2018-11-29 NOTE — ED Triage Notes (Signed)
Per [facility] pt coming from home c/o headache. Family states yesterday patient had BP in the 70s and BP of 59/36 this am. EMS reports initial BP of 60/42 up to 118/58 after one liter bolus of NS. CBG 166. Patient alert and orientated x 4.

## 2018-11-29 NOTE — ED Provider Notes (Signed)
[place] EMERGENCY DEPARTMENT Provider Note   CSN: [id number] Arrival date & time: 11/29/18  1233     History   Chief Complaint Chief Complaint  Patient presents with  . Headache    HPI Paul Ortiz is a 73 y.o. male history of hypertension, hyperlipidemia, Parkinson's who presented with hypotension.  Patient was recently admitted for TIA.  Patient's carbidopa levodopa was decreased and he was put on a statin and Plavix.  Wife noticed that his blood pressure was low yesterday at home.  She states is around 60 systolic.  When EMS got there initial blood pressure was 60/42.  He was given 1 L NS bolus prior to arrival and arrival BP was 118/58.  Per the wife he is eating and drinking well .  He has no worsening weakness abdominal pain or chest pain or vomiting. He does have some headaches but no focal weakness or speech deficit.  Denies any fevers at home or sick contacts.     The history is provided by the patient and a relative.    Past Medical History:  Diagnosis Date  . Abdominal wall hernia   . BPH with obstruction/lower urinary tract symptoms   . Coronary atherosclerosis of native coronary artery   . Hypertension   . Insomnia   . Major depression   . Mixed hyperlipidemia   . OSA (obstructive sleep apnea)   . Parkinson disease ([place])   . Vasomotor rhinitis     Patient Active Problem List   Diagnosis Date Noted  . Hallucinations 11/22/2018  . Acute encephalopathy 11/22/2018  . TIA (transient ischemic attack) 11/21/2018  . Mixed hyperlipidemia   . Parkinson disease ([place])   . Hypertension   . Essential hypertension 07/07/2016  . Hyperlipidemia 07/07/2016  . Anxiety 07/06/2016  . OSA (obstructive sleep apnea) 07/06/2016  . Panic attacks 07/06/2016  . PD (Parkinson's disease) ([place]) 07/06/2016  . RLS (restless legs syndrome) 07/06/2016  . Chronic low back pain 06/17/2016  . Hemangioma of spine 06/17/2016  . 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   . Pain in both thighs 06/17/2016  . SVT (supraventricular tachycardia) ([place]) 04/12/2016  . Coronary artery disease involving native coronary artery of native heart without angina pectoris 10/21/2014  . 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 CARDIAC SURGERY    . CATARACT EXTRACTION, BILATERAL Bilateral   . CORONARY ARTERY BYPASS GRAFT  2005  . PEG placement     and reversal for duopa tube - continuous infusion of Carbidopa/Levodopa, but he was too somnolent and so had it reversed        Home Medications    Prior to Admission medications   Medication Sig Start Date End Date Taking? Authorizing Provider  acetaminophen (TYLENOL) 500 MG tablet Take 1,000 mg by mouth every 6 (six) hours as needed. Alternating with 2 (ibuprofen 200mg  ) every 3 hours at meal times and Bedtime.    [provider]  alfuzosin (UROXATRAL) 10 MG 24 hr tablet Take 10 mg by mouth at bedtime. 11/14/18   [provider]  ALPRAZolam Duanne Moron) 1 MG tablet Take 1 mg by mouth at bedtime.    [provider]  aspirin EC 81 MG EC tablet Take 1 tablet (81 mg total) by mouth daily for 21 days. 11/24/18 12/15/18  Radene Gunning, NP  atorvastatin (LIPITOR) 40 MG tablet Take 1 tablet (40 mg total) by mouth daily  at 6 PM. 11/23/18   Black, Lezlie Octave, NP  carbidopa-levodopa (SINEMET CR) 50-200 MG tablet Take 1 tablet by mouth at bedtime.    [provider]  carbidopa-levodopa (SINEMET IR) 25-100 MG tablet Take 2 tablets by mouth 3 (three) times daily. 11/23/18 12/23/18  Radene Gunning, NP  Cholecalciferol (VITAMIN D-1000 MAX ST) 1000 units tablet Take 1 tablet by mouth daily.    [provider]  clopidogrel (PLAVIX) 75 MG tablet Take 1 tablet (75 mg total) by mouth daily. 11/24/18   Black, Lezlie Octave, NP  diclofenac sodium (VOLTAREN) 1 % GEL Apply 1 g topically 4 (four) times daily as needed (pain on thighs, lower back, and  hips).    [provider]  ibuprofen (ADVIL) 200 MG tablet Take 400 mg by mouth every 6 (six) hours as needed for moderate pain (rotates with tylenol every 3 hours at meal times and bedtime). Take 2 tab daily     [provider]  ipratropium (ATROVENT) 0.06 % nasal spray Place 2 sprays into the nose as needed. 07/22/14   [provider]  lidocaine (XYLOCAINE) 2 % solution Use as directed 5-10 mLs in the mouth or throat daily. Swish and spit out 11/15/18   [provider]  Magnesium 500 MG CAPS Take 250 mg by mouth daily. Taking at noon    [provider]  metoprolol succinate (TOPROL-XL) 25 MG 24 hr tablet Take 12.5 mg by mouth 2 (two) times daily. Half in am and half at night 02/16/16   [provider]  Omega-3 Fatty Acids (FISH OIL) 1000 MG CAPS Take 2,000 mg by mouth daily.     [provider]  ondansetron (ZOFRAN) 8 MG tablet Take 8 mg by mouth every 8 (eight) hours as needed for nausea.     [provider]  potassium chloride (K-DUR) 10 MEQ tablet Take 5 mEq by mouth daily.     [provider]  Propylene Glycol-Glycerin (SOOTHE) 0.6-0.6 % SOLN Place 1 drop into both eyes daily as needed (dry eyes).    [provider]  senna-docusate (SENOKOT-S) 8.6-50 MG tablet Take 1 tablet by mouth at bedtime as needed for mild constipation. 11/23/18   Black, Lezlie Octave, NP  vitamin B-12 (CYANOCOBALAMIN) 1000 MCG tablet Take 1,000 mcg by mouth daily.    [provider]    Family History Family History  Problem Relation Age of Onset  . Heart attack Father 65  . Cancer Sister        non hodgkins lymphoma; scleroderma  . Diabetes Daughter   . Fibromyalgia Daughter     Social History Social History   Tobacco Use  . Smoking status: Never Smoker  . Smokeless tobacco: Never Used  Substance Use Topics  . Alcohol use: No  . Drug use: No     Allergies   Lactose intolerance (gi) and Penicillins   Review  of Systems Review of Systems  Neurological: Positive for headaches.  All other systems reviewed and are negative.    Physical Exam Updated Vital Signs BP 135/66   Pulse 73   Temp 97.7 F (36.5 C) (Oral)   Resp 15   Ht 6' (1.829 m)   Wt 76.8 kg   SpO2 98%   BMI 22.96 kg/m   Physical Exam Vitals signs and nursing note reviewed.  Constitutional:      Comments: Tremors consistent with parkinson's   HENT:     Head: Normocephalic.  Mouth/Throat:     Mouth: Mucous membranes are moist.  Eyes:     Extraocular Movements: Extraocular movements intact.     Pupils: Pupils are equal, round, and reactive to light.  Neck:     Musculoskeletal: Normal range of motion and neck supple.  Cardiovascular:     Rate and Rhythm: Normal rate and regular rhythm.  Pulmonary:     Effort: Pulmonary effort is normal.     Breath sounds: Normal breath sounds.  Abdominal:     General: Bowel sounds are normal.     Palpations: Abdomen is soft.  Musculoskeletal: Normal range of motion.  Skin:    General: Skin is warm.  Neurological:     Mental Status: He is alert and oriented to person, place, and time.     Cranial Nerves: No cranial nerve deficit or facial asymmetry.     Comments: CN 2- 12 intact, nl strength throughout, tremors improved with movement   Psychiatric:        Mood and Affect: Mood normal.        Behavior: Behavior normal.      ED Treatments / Results  Labs (all labs ordered are listed, but only abnormal results are displayed) Labs Reviewed  CBC WITH DIFFERENTIAL/PLATELET - Abnormal; Notable for the following components:      Result Value   RBC 3.94 (*)    Hemoglobin 12.5 (*)    HCT 38.7 (*)    Lymphs Abs 0.6 (*)    All other components within normal limits  COMPREHENSIVE METABOLIC PANEL - Abnormal; Notable for the following components:   Glucose, Bld 121 (*)    BUN 32 (*)    Total Protein 6.1 (*)    Albumin 3.3 (*)    All other components within normal limits   URINALYSIS, ROUTINE W REFLEX MICROSCOPIC - Abnormal; Notable for the following components:   Ketones, ur 5 (*)    All other components within normal limits  URINE CULTURE  LIPASE, BLOOD    EKG EKG Interpretation  Date/Time:  Wednesday November 29 2018 12:48:32 EDT Ventricular Rate:  80 PR Interval:    QRS Duration: 108 QT Interval:  402 QTC Calculation: 464 R Axis:   -90 Text Interpretation: Sinus rhythm RVH with secondary repolarization abnrm Inferior infarct, old No significant change since last tracing Confirmed by Wandra Arthurs [phone]) on 11/29/2018 1:04:09 PM   Radiology Ct Head Wo Contrast  Result Date: 11/29/2018 CLINICAL DATA:  Headache, hypotension EXAM: CT HEAD WITHOUT CONTRAST TECHNIQUE: Contiguous axial images were obtained from the base of the skull through the vertex without intravenous contrast. COMPARISON:  11/21/2018 FINDINGS: Brain: No evidence of acute infarction, hemorrhage, hydrocephalus, extra-axial collection or mass lesion/mass effect. Extensive low-density changes within the periventricular and subcortical white matter compatible with chronic microvascular ischemic change. Mild-moderate diffuse cerebral volume loss. Vascular: Mild atherosclerotic calcifications involving the large vessels of the skull base. No unexpected hyperdense vessel. Skull: Normal. Negative for fracture or focal lesion. Sinuses/Orbits: No acute finding. Other: None. IMPRESSION: 1. No acute intracranial findings.  Stable exam from 11/21/2018. 2. Chronic microvascular ischemic changes. Electronically Signed   By: Davina Poke M.D.   On: 11/29/2018 14:12   Dg Chest Port 1 View  Result Date: 11/29/2018 CLINICAL DATA:  Altered mental status. Hypotension. EXAM: PORTABLE CHEST 1 VIEW COMPARISON:  06/14/2018 FINDINGS: The heart size and pulmonary vascularity are normal. Aortic atherosclerosis. CABG. No infiltrates or effusions. No acute bone abnormality. IMPRESSION: 1. No acute abnormalities. 2.  Aortic atherosclerosis. Electronically Signed   By: Lorriane Shire M.D.   On: 11/29/2018 13:04    Procedures Procedures (including critical care time)  Medications Ordered in ED Medications  sodium chloride 0.9 % bolus 1,000 mL (1,000 mLs Intravenous New Bag/Given 11/29/18 1300)     Initial Impression / Assessment and Plan / ED Course  I have reviewed the triage vital signs and the nursing notes.  Pertinent labs & imaging results that were available during my care of the patient were reviewed by me and considered in my medical decision making (see chart for details).     Paul Ortiz is a 73 y.o. male who presented with hypotension .  He was hypotensive at home.  He has underlying Parkinson's and his carbidopa was actually decreased recently.  I wonder if his hypotension is secondary to either dehydration or underlying Parkinson's.  He has no abdominal pain and is afebrile and I doubt sepsis.  Since he was recently admitted for TIA will get CT head and as well as lab work and chest x-ray and urinalysis.  3:31 PM CT head unremarkable. Labs and UA unremarkable. Eating well in the ED. BP 134/73 on discharge. Stable for discharge. Encouraged him to continue eating and follow up with neurology. He is on metoprolol and I recommend holding it for now    Final Clinical Impressions(s) / ED Diagnoses   Final diagnoses:  Hypotension, unspecified hypotension type    ED Discharge Orders    None       Drenda Freeze, MD 11/29/18 [phone]

## 2018-11-30 DIAGNOSIS — G20 Parkinson's disease: Secondary | ICD-10-CM | POA: Diagnosis not present

## 2018-11-30 DIAGNOSIS — I10 Essential (primary) hypertension: Secondary | ICD-10-CM | POA: Diagnosis not present

## 2018-11-30 DIAGNOSIS — G4733 Obstructive sleep apnea (adult) (pediatric): Secondary | ICD-10-CM | POA: Diagnosis not present

## 2018-11-30 DIAGNOSIS — N400 Enlarged prostate without lower urinary tract symptoms: Secondary | ICD-10-CM | POA: Diagnosis not present

## 2018-11-30 DIAGNOSIS — E559 Vitamin D deficiency, unspecified: Secondary | ICD-10-CM | POA: Diagnosis not present

## 2018-11-30 DIAGNOSIS — I2510 Atherosclerotic heart disease of native coronary artery without angina pectoris: Secondary | ICD-10-CM | POA: Diagnosis not present

## 2018-11-30 LAB — URINE CULTURE: Culture: NO GROWTH

## 2018-12-01 ENCOUNTER — Telehealth: Payer: Self-pay | Admitting: Neurology

## 2018-12-01 DIAGNOSIS — E559 Vitamin D deficiency, unspecified: Secondary | ICD-10-CM | POA: Diagnosis not present

## 2018-12-01 DIAGNOSIS — I2510 Atherosclerotic heart disease of native coronary artery without angina pectoris: Secondary | ICD-10-CM | POA: Diagnosis not present

## 2018-12-01 DIAGNOSIS — G20 Parkinson's disease: Secondary | ICD-10-CM | POA: Diagnosis not present

## 2018-12-01 DIAGNOSIS — N400 Enlarged prostate without lower urinary tract symptoms: Secondary | ICD-10-CM | POA: Diagnosis not present

## 2018-12-01 DIAGNOSIS — I10 Essential (primary) hypertension: Secondary | ICD-10-CM | POA: Diagnosis not present

## 2018-12-01 DIAGNOSIS — G4733 Obstructive sleep apnea (adult) (pediatric): Secondary | ICD-10-CM | POA: Diagnosis not present

## 2018-12-01 NOTE — Telephone Encounter (Signed)
Left message with the after hour service on 12-01-18 @ 12:07 pm    Returning a call to our office

## 2018-12-01 NOTE — Telephone Encounter (Signed)
Patient wife called and states that the patient went to the Hospital last Tuesday was there for 2 days. She states while at the hospital they changed several of his medications and the Carbidopa levodopa was changed from 25/250 to 25/100 2 pills a day. She wants to know what Dr Tat would like him to take  Please call

## 2018-12-01 NOTE — Telephone Encounter (Signed)
See below regarding patients med changes and advise

## 2018-12-01 NOTE — Telephone Encounter (Signed)
Spoke with [name] which per DPR in chart is allowed by patient and went over instructions with her about the medication. She verbalized understanding and had no further questions

## 2018-12-01 NOTE — Telephone Encounter (Signed)
Left message for a call back on [phone] (mobile)

## 2018-12-01 NOTE — Telephone Encounter (Signed)
See 11/22/18 phone note when the hospital called me.  He should be on: carbidopa/levodopa 25/100, 2 po tid and carbidopa/levodopa 50/200 at bed

## 2018-12-02 DIAGNOSIS — F419 Anxiety disorder, unspecified: Secondary | ICD-10-CM | POA: Diagnosis not present

## 2018-12-02 DIAGNOSIS — I679 Cerebrovascular disease, unspecified: Secondary | ICD-10-CM | POA: Diagnosis not present

## 2018-12-02 DIAGNOSIS — D518 Other vitamin B12 deficiency anemias: Secondary | ICD-10-CM | POA: Diagnosis not present

## 2018-12-02 DIAGNOSIS — E7800 Pure hypercholesterolemia, unspecified: Secondary | ICD-10-CM | POA: Diagnosis not present

## 2018-12-02 DIAGNOSIS — N401 Enlarged prostate with lower urinary tract symptoms: Secondary | ICD-10-CM | POA: Diagnosis not present

## 2018-12-02 DIAGNOSIS — I10 Essential (primary) hypertension: Secondary | ICD-10-CM | POA: Diagnosis not present

## 2018-12-02 DIAGNOSIS — G20 Parkinson's disease: Secondary | ICD-10-CM | POA: Diagnosis not present

## 2018-12-03 DIAGNOSIS — I10 Essential (primary) hypertension: Secondary | ICD-10-CM | POA: Diagnosis not present

## 2018-12-03 DIAGNOSIS — D518 Other vitamin B12 deficiency anemias: Secondary | ICD-10-CM | POA: Diagnosis not present

## 2018-12-03 DIAGNOSIS — E7800 Pure hypercholesterolemia, unspecified: Secondary | ICD-10-CM | POA: Diagnosis not present

## 2018-12-03 DIAGNOSIS — G20 Parkinson's disease: Secondary | ICD-10-CM | POA: Diagnosis not present

## 2018-12-03 DIAGNOSIS — N401 Enlarged prostate with lower urinary tract symptoms: Secondary | ICD-10-CM | POA: Diagnosis not present

## 2018-12-03 DIAGNOSIS — I679 Cerebrovascular disease, unspecified: Secondary | ICD-10-CM | POA: Diagnosis not present

## 2018-12-03 DIAGNOSIS — F419 Anxiety disorder, unspecified: Secondary | ICD-10-CM | POA: Diagnosis not present

## 2018-12-05 ENCOUNTER — Telehealth: Payer: Self-pay | Admitting: Neurology

## 2018-12-05 DIAGNOSIS — E7800 Pure hypercholesterolemia, unspecified: Secondary | ICD-10-CM | POA: Diagnosis not present

## 2018-12-05 DIAGNOSIS — G20 Parkinson's disease: Secondary | ICD-10-CM | POA: Diagnosis not present

## 2018-12-05 DIAGNOSIS — N401 Enlarged prostate with lower urinary tract symptoms: Secondary | ICD-10-CM | POA: Diagnosis not present

## 2018-12-05 DIAGNOSIS — F419 Anxiety disorder, unspecified: Secondary | ICD-10-CM | POA: Diagnosis not present

## 2018-12-05 DIAGNOSIS — I679 Cerebrovascular disease, unspecified: Secondary | ICD-10-CM | POA: Diagnosis not present

## 2018-12-05 DIAGNOSIS — I10 Essential (primary) hypertension: Secondary | ICD-10-CM | POA: Diagnosis not present

## 2018-12-05 NOTE — Telephone Encounter (Signed)
The disease causes hallucinations.  His wife wanted him off of seroquel which treated the hallucinations.  I'm not sure that we can release info to hospice nurse without release as I didn't refer him

## 2018-12-05 NOTE — Telephone Encounter (Signed)
Sarah from [facility] care and Hospice is calling in about patient's medication management. She said he is experiencing really bad hallucinations and she is wanting to speak with someone about the medications he is on. She is his Merchandiser, retail. Thanks!

## 2018-12-05 NOTE — Telephone Encounter (Signed)
With the medications prescribed by Dr Tat, would any of those be contributing to hallucinations? Please advise

## 2018-12-06 NOTE — Telephone Encounter (Signed)
See below and schedule appointment for patient to start new medication

## 2018-12-06 NOTE — Telephone Encounter (Signed)
Pt was taken off of seroquel because the wife asked them to take them off of it - she told them it was making the hallucinations worse.  There are many documented phone calls to our office about this.  We told her this was not the case but she didn't believe it and she wanted him off of it.  We told her that he would still have hallucinations off of it and likely worse.  She has not unfortunately trusted any decision I have made, which is why we have suggested that she take him back to [facility], as she had great trust in the providers there, and trust is really important in care.  We can try nuplazid instead if she wants (it is like seroquel but specifically for PD related hallucations).  I don't think that he has been on that.  Also, if he is still on the gabapentin, I thought that was potentially making hallucinations worse.

## 2018-12-06 NOTE — Telephone Encounter (Signed)
Patient is schedule to do a Virtual on 12-13-18 @ 11:15

## 2018-12-06 NOTE — Telephone Encounter (Signed)
Patients wife agrees to try the new medication. [facility] is the pharmacy. She was transferred up front to get the HIPAA forms signed to be able to talk to the Hospice nurse

## 2018-12-06 NOTE — Telephone Encounter (Signed)
Patients wife stated that when in the hospital the Neurologist told her to take him off of the Seroquel because it was making his hallucinations worse. She also stated he was moved off of metoprolol because of his blood pressure fluctuating. She states that she knows that Dr Tat is good at what she does and she trusts her judgement. She does not know what to do. Per previous telephone calls it was mentioned seeing old neurologist at [facility] patients wife declined doing that. She said that the patient is seeing worms and snakes all over his body. 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 They have hospice care, a social worker from hospice, and got approved from the [place] to get six extra hours of care. She is just not sure what to do or how to move forward to help him. He is on the waiting list at [facility] and has been since January. Please advise if it is anything I can tell [name] in regards to helping her husband with his worsening symptoms.

## 2018-12-06 NOTE — Telephone Encounter (Signed)
This is a medication that is from a central pharmacy.  Its a bit complicated and requires a form to be signed.  Have front put him on for VV next wed at 11:15 since he can't get in since on hospice

## 2018-12-07 DIAGNOSIS — I679 Cerebrovascular disease, unspecified: Secondary | ICD-10-CM | POA: Diagnosis not present

## 2018-12-07 DIAGNOSIS — E7800 Pure hypercholesterolemia, unspecified: Secondary | ICD-10-CM | POA: Diagnosis not present

## 2018-12-07 DIAGNOSIS — I10 Essential (primary) hypertension: Secondary | ICD-10-CM | POA: Diagnosis not present

## 2018-12-07 DIAGNOSIS — G20 Parkinson's disease: Secondary | ICD-10-CM | POA: Diagnosis not present

## 2018-12-07 DIAGNOSIS — F419 Anxiety disorder, unspecified: Secondary | ICD-10-CM | POA: Diagnosis not present

## 2018-12-07 DIAGNOSIS — N401 Enlarged prostate with lower urinary tract symptoms: Secondary | ICD-10-CM | POA: Diagnosis not present

## 2018-12-12 DIAGNOSIS — I10 Essential (primary) hypertension: Secondary | ICD-10-CM | POA: Diagnosis not present

## 2018-12-12 DIAGNOSIS — F419 Anxiety disorder, unspecified: Secondary | ICD-10-CM | POA: Diagnosis not present

## 2018-12-12 DIAGNOSIS — N401 Enlarged prostate with lower urinary tract symptoms: Secondary | ICD-10-CM | POA: Diagnosis not present

## 2018-12-12 DIAGNOSIS — G20 Parkinson's disease: Secondary | ICD-10-CM | POA: Diagnosis not present

## 2018-12-12 DIAGNOSIS — I679 Cerebrovascular disease, unspecified: Secondary | ICD-10-CM | POA: Diagnosis not present

## 2018-12-12 DIAGNOSIS — E7800 Pure hypercholesterolemia, unspecified: Secondary | ICD-10-CM | POA: Diagnosis not present

## 2018-12-12 NOTE — Progress Notes (Signed)
Virtual Visit via Video Note The purpose of this virtual visit is to provide medical care while limiting exposure to the novel coronavirus.    Consent was obtained for video visit:  Yes.   from wife Answered questions that patient had about telehealth interaction:  Yes.   I discussed the limitations, risks, security and privacy concerns of performing an evaluation and management service by telemedicine. I also discussed with the patient that there may be a patient responsible charge related to this service. The patient expressed understanding and agreed to proceed.  Pt location: Home Physician Location: home Name of referring provider:  Shirline Frees, MD I connected with Kerron C Gullatt at patients initiation/request on 12/13/2018 at 11:15 AM EST by video enabled telemedicine application and verified that I am speaking with the correct person using two identifiers. Pt MRN:  [id number] Pt DOB:  [birth year] Video Participants:  Donyel Hollie Beach;  Wife supplies history along with review of chart   History of Present Illness:  Patient seen today in follow-up for Parkinson's disease, with associated Parkinson's disease psychosis.  Numerous records were reviewed since our last visit, which took more than 30 minutes of nonface-to-face time.  Patient was on Seroquel and we are slowly increasing the dose.  His wife called me on October 20 stating that she thought that the Seroquel was causing more hallucinations.  I explained that I would not particularly make sense, and I thought it was the disease causing the hallucinations, but it was okay if she did not want to give it to him.  Patient ended up going to the hospital.  The emergency room doctor was told the same thing that hallucinations were worse since starting the Seroquel.  Code stroke was called upon arrival to the emergency room as wife reported that the patient was having left-sided weakness.  CT of the head was done and was nonacute.  CTA of  the head was done and demonstrated moderate to severe stenosis of the distal left A2 segment.  MRI of the brain was completed on November 22, 2018 and was nonacute.  There was advanced small vessel disease.  The patient saw Dr. Erlinda Hong.  Patient was placed on aspirin and Plavix, with the goal of keeping him on dual antiplatelet therapy for 3 weeks and then Plavix alone.  His LDL was 148.  Patient was placed on Lipitor.  Patient's wife had initially wanted him in a nursing facility, but then changed her mind and wanted to take him home.  She states today that the facilities that were offered to him ([facility]) were just not up to par to be able to take care of him.  I did receive a call during that hospitalization from the hospitalist.  Asked if they could decrease his levodopa.  I did not have an objection to that, but did state that he will likely be more stiff.  He was previously on carbidopa/levodopa 25/250, 1 tablet 3 times per day and to change that to carbidopa/levodopa 25/100, 2 tablets 3 times per day and continued him on carbidopa/levodopa 50/200 at bedtime.  He was back in the emergency room on October 28 for complaints of low blood pressure.  Emergency room work-up was unremarkable and he was discharged from the emergency room.  His wife does state that he was taken off of the metoprolol and seems to be doing better in that regard.    Patient's wife states today that she still thinks that the quetiapine made  hallucinations worse.  When he was also noted, she thought that hallucinations were better, but that they are getting much worse now.  She states that he feels that he has snakes crawling all over him.  He is very frightened by these, although he does believe her when he is told that they are not there.  He is off of the gabapentin.  There is caregiving in the home.  She has caregiving for 5 hours 3 days/week, 2 hours 2 days/week and then is paying out-of-pocket for 3 hours on Saturday and Sunday.  He is  on the wait list at [facility], but has been on the wait list to there since January (11 months now).   Current Outpatient Medications on File Prior to Visit  Medication Sig Dispense Refill   acetaminophen (TYLENOL) 500 MG tablet Take 1,000 mg by mouth every 6 (six) hours as needed. Alternating with 2 (ibuprofen 200mg  ) every 3 hours at meal times and Bedtime.     alfuzosin (UROXATRAL) 10 MG 24 hr tablet Take 10 mg by mouth at bedtime.     ALPRAZolam (XANAX) 1 MG tablet Take 1 mg by mouth at bedtime.     aspirin EC 81 MG EC tablet Take 1 tablet (81 mg total) by mouth daily for 21 days. 21 tablet 0   atorvastatin (LIPITOR) 40 MG tablet Take 1 tablet (40 mg total) by mouth daily at 6 PM. 40 tablet 1   carbidopa-levodopa (SINEMET CR) 50-200 MG tablet Take 1 tablet by mouth at bedtime.     carbidopa-levodopa (SINEMET IR) 25-100 MG tablet Take 2 tablets by mouth 3 (three) times daily. 180 tablet 0   Cholecalciferol (VITAMIN D-1000 MAX ST) 1000 units tablet Take 1 tablet by mouth daily.     clopidogrel (PLAVIX) 75 MG tablet Take 1 tablet (75 mg total) by mouth daily. 30 tablet 1   diclofenac sodium (VOLTAREN) 1 % GEL Apply 1 g topically 4 (four) times daily as needed (pain on thighs, lower back, and hips).     ibuprofen (ADVIL) 200 MG tablet Take 400 mg by mouth every 6 (six) hours as needed for moderate pain (rotates with tylenol every 3 hours at meal times and bedtime). Take 2 tab daily      ipratropium (ATROVENT) 0.06 % nasal spray Place 2 sprays into the nose as needed.     lidocaine (XYLOCAINE) 2 % solution Use as directed 5-10 mLs in the mouth or throat daily. Swish and spit out     Magnesium 500 MG CAPS Take 250 mg by mouth daily. Taking at noon     Omega-3 Fatty Acids (FISH OIL) 1000 MG CAPS Take 2,000 mg by mouth daily.      ondansetron (ZOFRAN) 8 MG tablet Take 8 mg by mouth every 8 (eight) hours as needed for nausea.      potassium chloride (K-DUR) 10 MEQ tablet Take 5 mEq by  mouth daily.      Propylene Glycol-Glycerin (SOOTHE) 0.6-0.6 % SOLN Place 1 drop into both eyes daily as needed (dry eyes).     senna-docusate (SENOKOT-S) 8.6-50 MG tablet Take 1 tablet by mouth at bedtime as needed for mild constipation.     vitamin B-12 (CYANOCOBALAMIN) 1000 MCG tablet Take 1,000 mcg by mouth daily.     XANAX XR 1 MG 24 hr tablet Take 1 mg by mouth at bedtime.     No current facility-administered medications on file prior to visit.      Observations/Objective:  Vitals:   12/13/18 1143  BP: 130/80   GEN:  The patient appears stated age and is in NAD. Virtually 100% of visit in counseling    Assessment and Plan:   1.Parkinson's disease, diagnosed in 2011 -Patient's Parkinson's disease has now been complicated by orthostatic hypotension, wearing off, confusion/delusions/hallucinations - We talked about safety in the home. -We decided to continue the carbidopa/levodopa 25/100, 2 tablets 3 times per day.  This was recently decreased. -We decided to continue the carbidopa/levodopa 50/200 CR at bedtime  -Discussed goals of care with patient/wife.  Discussed importance of trust in physician care.  It has appeared that the patient's wife really has not ever formed a good trust in the care here, likely because he had care at [facility] for many years (although she denies this today when we discussed it).  Once he transitioned care here, he already had significant dementia with confusion and hallucinations.  His wife has been overwhelmed and overburdened with care.  Discussed with her that she really needed to seek care for the patient where she trusted the care and she unequivocally states that it that was here.  We are happy to take care of him.   2.Parkinson's disease dementia with confusion/hallucinations -Long discussion with patient's wife.  She thought quetiapine made him worse.  I really  do not understand the pathophysiology of that, and believe that the disease was just progressing.  Discussed this with her today, although wife states that she thought he got better when he got off of it, but admits that he is much worse now.   Discussed that it is the disease that causes confusion and hallucinations.  -After quite some discussion, we ultimately decided to start Nuplazid, 34 mg daily.  Forms will need to be signed, but this is somewhat complicated by the fact that he is on hospice, which is now paying for his medications.  I will need to figure out how this works with hospice.  In the meantime, they can stop by the office and pick up some samples.  Discussed risk, benefits, and side effects.  Discussed that it can take up to 6 weeks to start seeing benefit from this medication.  I also discussed with patient's wife that only 13% of the patients in the studies had complete resolution of hallucinations with Nuplazid, but many patients had benefit from the medication, such as realizing hallucinations were not real and were no longer scary.  Patient's wife really would like to trial the medication.  Risks, benefits, side effects and alternative therapies were discussed.  The opportunity to ask questions was given and they were answered to the best of my ability.  We discussed the black box warning.  3.Neurogenic orthostatic hypotension -Patient was under the impression that when he feels lightheaded that this is a blood sugar problem. I suspect that this is just orthostasis, as was previously identified in his [facility] records as well. He needs to increase his water intake.  4.Intermittent diplopia -Patient has seen neuro-ophthalmology a few times at [facility], with no changes between 2015 and 2020. Patient has had identified a relative small afferent pupillary defect on the left as well as a left ankle hyperphoria, for which prisms have been recommended   5.   Possible recent TIA  -He is on Plavix and aspirin.  Discussed with the patient and wife that dual antiplatelet therapy was only to be for 3 weeks.  His wife is aware of this, and states that they will  be discontinuing his aspirin on Saturday.  Patient should remain on the Lipitor.  Follow Up Instructions:  he already has an appt in feb and will keep that.  -I discussed the assessment and treatment plan with the patient. The patient was provided an opportunity to ask questions and all were answered. The patient agreed with the plan and demonstrated an understanding of the instructions.   The patient was advised to call back or seek an in-person evaluation if the symptoms worsen or if the condition fails to improve as anticipated.    Total Time spent in visit with the patient was:  25 min, of which more than 50% of the time was spent in counseling and/or coordinating care on .   Pt understands and agrees with the plan of care outlined.    This did not include the 30 min of record review which was detailed above, which was non face to face time.    Alonza Bogus, DO

## 2018-12-13 ENCOUNTER — Telehealth (INDEPENDENT_AMBULATORY_CARE_PROVIDER_SITE_OTHER): Admitting: Neurology

## 2018-12-13 ENCOUNTER — Telehealth: Payer: Self-pay | Admitting: Neurology

## 2018-12-13 ENCOUNTER — Other Ambulatory Visit: Payer: Self-pay

## 2018-12-13 ENCOUNTER — Telehealth: Payer: Self-pay

## 2018-12-13 DIAGNOSIS — G20 Parkinson's disease: Secondary | ICD-10-CM | POA: Diagnosis not present

## 2018-12-13 DIAGNOSIS — F0281 Dementia in other diseases classified elsewhere with behavioral disturbance: Secondary | ICD-10-CM | POA: Diagnosis not present

## 2018-12-13 DIAGNOSIS — R441 Visual hallucinations: Secondary | ICD-10-CM

## 2018-12-13 DIAGNOSIS — G459 Transient cerebral ischemic attack, unspecified: Secondary | ICD-10-CM

## 2018-12-13 DIAGNOSIS — F02818 Dementia in other diseases classified elsewhere, unspecified severity, with other behavioral disturbance: Secondary | ICD-10-CM

## 2018-12-13 MED ORDER — NUPLAZID 34 MG PO CAPS: 1.0000 | ORAL_CAPSULE | Freq: Every day | ORAL | 0 refills | Status: AC

## 2018-12-13 NOTE — Telephone Encounter (Signed)
Spoke with Paul Ortiz and she stated that she was unaware of my conversation with Paul Ortiz, but Paul Ortiz is her boss so for me to go off of what Paul Ortiz told me and disregard her message.

## 2018-12-13 NOTE — Telephone Encounter (Signed)
Called [facility] [place]: [phone]. Caryl Pina stated that I needed to fax todays office visit along with the Nuplazid form over to them and they will fill it out and send all information in. Fax number : [phone]  Nuplazid samples given and documented on medication list. Faxed 12/13/2018 office visit along with the Nuplazid forms to the facility as requested by [facility].

## 2018-12-13 NOTE — Telephone Encounter (Signed)
Sarah, RN called and left a message with Access Nurse at 12:47 PM stating that hospice cannot cover Nuplazid, it will be covered by the patient's insurance.

## 2018-12-14 DIAGNOSIS — I679 Cerebrovascular disease, unspecified: Secondary | ICD-10-CM | POA: Diagnosis not present

## 2018-12-14 DIAGNOSIS — N401 Enlarged prostate with lower urinary tract symptoms: Secondary | ICD-10-CM | POA: Diagnosis not present

## 2018-12-14 DIAGNOSIS — G20 Parkinson's disease: Secondary | ICD-10-CM | POA: Diagnosis not present

## 2018-12-14 DIAGNOSIS — F419 Anxiety disorder, unspecified: Secondary | ICD-10-CM | POA: Diagnosis not present

## 2018-12-14 DIAGNOSIS — I10 Essential (primary) hypertension: Secondary | ICD-10-CM | POA: Diagnosis not present

## 2018-12-14 DIAGNOSIS — E7800 Pure hypercholesterolemia, unspecified: Secondary | ICD-10-CM | POA: Diagnosis not present

## 2018-12-19 ENCOUNTER — Telehealth: Payer: Self-pay

## 2018-12-19 DIAGNOSIS — I10 Essential (primary) hypertension: Secondary | ICD-10-CM | POA: Diagnosis not present

## 2018-12-19 DIAGNOSIS — E7800 Pure hypercholesterolemia, unspecified: Secondary | ICD-10-CM | POA: Diagnosis not present

## 2018-12-19 DIAGNOSIS — N401 Enlarged prostate with lower urinary tract symptoms: Secondary | ICD-10-CM | POA: Diagnosis not present

## 2018-12-19 DIAGNOSIS — I679 Cerebrovascular disease, unspecified: Secondary | ICD-10-CM | POA: Diagnosis not present

## 2018-12-19 DIAGNOSIS — F419 Anxiety disorder, unspecified: Secondary | ICD-10-CM | POA: Diagnosis not present

## 2018-12-19 DIAGNOSIS — G20 Parkinson's disease: Secondary | ICD-10-CM | POA: Diagnosis not present

## 2018-12-19 MED ORDER — CARBIDOPA-LEVODOPA 25-100 MG PO TABS: 2.0000 | ORAL_TABLET | Freq: Three times a day (TID) | ORAL | 0 refills | Status: DC

## 2018-12-19 NOTE — Telephone Encounter (Signed)
Judson Roch called to let us know Hospice has not received the fax in regards to Nuplazid, a confirmation was received in this office on 12/13/2018 that the fax went through. Re sending the fax.

## 2018-12-22 DIAGNOSIS — G20 Parkinson's disease: Secondary | ICD-10-CM | POA: Diagnosis not present

## 2018-12-22 DIAGNOSIS — I10 Essential (primary) hypertension: Secondary | ICD-10-CM | POA: Diagnosis not present

## 2018-12-22 DIAGNOSIS — I679 Cerebrovascular disease, unspecified: Secondary | ICD-10-CM | POA: Diagnosis not present

## 2018-12-22 DIAGNOSIS — F419 Anxiety disorder, unspecified: Secondary | ICD-10-CM | POA: Diagnosis not present

## 2018-12-22 DIAGNOSIS — N401 Enlarged prostate with lower urinary tract symptoms: Secondary | ICD-10-CM | POA: Diagnosis not present

## 2018-12-22 DIAGNOSIS — E7800 Pure hypercholesterolemia, unspecified: Secondary | ICD-10-CM | POA: Diagnosis not present

## 2018-12-23 DIAGNOSIS — N401 Enlarged prostate with lower urinary tract symptoms: Secondary | ICD-10-CM | POA: Diagnosis not present

## 2018-12-23 DIAGNOSIS — E7800 Pure hypercholesterolemia, unspecified: Secondary | ICD-10-CM | POA: Diagnosis not present

## 2018-12-23 DIAGNOSIS — G20 Parkinson's disease: Secondary | ICD-10-CM | POA: Diagnosis not present

## 2018-12-23 DIAGNOSIS — F419 Anxiety disorder, unspecified: Secondary | ICD-10-CM | POA: Diagnosis not present

## 2018-12-23 DIAGNOSIS — I10 Essential (primary) hypertension: Secondary | ICD-10-CM | POA: Diagnosis not present

## 2018-12-23 DIAGNOSIS — I679 Cerebrovascular disease, unspecified: Secondary | ICD-10-CM | POA: Diagnosis not present

## 2018-12-25 ENCOUNTER — Telehealth: Payer: Self-pay | Admitting: *Deleted

## 2018-12-25 NOTE — Telephone Encounter (Signed)
Contacted residence and left a voice message for patient's wife, [name], to arrange a home visit. Awaiting call back.

## 2018-12-25 NOTE — Telephone Encounter (Signed)
Received a return call from patient's wife, [name]. She advised that patient is now under hospice care based out of [place], but is appreciative of phone call.

## 2018-12-26 ENCOUNTER — Telehealth: Payer: Self-pay | Admitting: Neurology

## 2018-12-26 DIAGNOSIS — N401 Enlarged prostate with lower urinary tract symptoms: Secondary | ICD-10-CM | POA: Diagnosis not present

## 2018-12-26 DIAGNOSIS — G20 Parkinson's disease: Secondary | ICD-10-CM | POA: Diagnosis not present

## 2018-12-26 DIAGNOSIS — I10 Essential (primary) hypertension: Secondary | ICD-10-CM | POA: Diagnosis not present

## 2018-12-26 DIAGNOSIS — F419 Anxiety disorder, unspecified: Secondary | ICD-10-CM | POA: Diagnosis not present

## 2018-12-26 DIAGNOSIS — I679 Cerebrovascular disease, unspecified: Secondary | ICD-10-CM | POA: Diagnosis not present

## 2018-12-26 DIAGNOSIS — E7800 Pure hypercholesterolemia, unspecified: Secondary | ICD-10-CM | POA: Diagnosis not present

## 2018-12-26 NOTE — Telephone Encounter (Signed)
Spoke with patients wife and discussed with her Dr Arturo Morton recommendations in regards to discussing with hospice the forms. No samples to give

## 2018-12-26 NOTE — Telephone Encounter (Signed)
Patient's wife called and requested samples of Nuplazid 34 MG.

## 2018-12-29 DIAGNOSIS — I10 Essential (primary) hypertension: Secondary | ICD-10-CM | POA: Diagnosis not present

## 2018-12-29 DIAGNOSIS — F419 Anxiety disorder, unspecified: Secondary | ICD-10-CM | POA: Diagnosis not present

## 2018-12-29 DIAGNOSIS — N401 Enlarged prostate with lower urinary tract symptoms: Secondary | ICD-10-CM | POA: Diagnosis not present

## 2018-12-29 DIAGNOSIS — G20 Parkinson's disease: Secondary | ICD-10-CM | POA: Diagnosis not present

## 2018-12-29 DIAGNOSIS — I679 Cerebrovascular disease, unspecified: Secondary | ICD-10-CM | POA: Diagnosis not present

## 2018-12-29 DIAGNOSIS — E7800 Pure hypercholesterolemia, unspecified: Secondary | ICD-10-CM | POA: Diagnosis not present

## 2018-12-31 DIAGNOSIS — I10 Essential (primary) hypertension: Secondary | ICD-10-CM | POA: Diagnosis not present

## 2018-12-31 DIAGNOSIS — I679 Cerebrovascular disease, unspecified: Secondary | ICD-10-CM | POA: Diagnosis not present

## 2018-12-31 DIAGNOSIS — E7800 Pure hypercholesterolemia, unspecified: Secondary | ICD-10-CM | POA: Diagnosis not present

## 2018-12-31 DIAGNOSIS — G20 Parkinson's disease: Secondary | ICD-10-CM | POA: Diagnosis not present

## 2018-12-31 DIAGNOSIS — F419 Anxiety disorder, unspecified: Secondary | ICD-10-CM | POA: Diagnosis not present

## 2018-12-31 DIAGNOSIS — N401 Enlarged prostate with lower urinary tract symptoms: Secondary | ICD-10-CM | POA: Diagnosis not present

## 2019-01-01 DIAGNOSIS — E7800 Pure hypercholesterolemia, unspecified: Secondary | ICD-10-CM | POA: Diagnosis not present

## 2019-01-01 DIAGNOSIS — I679 Cerebrovascular disease, unspecified: Secondary | ICD-10-CM | POA: Diagnosis not present

## 2019-01-01 DIAGNOSIS — I10 Essential (primary) hypertension: Secondary | ICD-10-CM | POA: Diagnosis not present

## 2019-01-01 DIAGNOSIS — G20 Parkinson's disease: Secondary | ICD-10-CM | POA: Diagnosis not present

## 2019-01-01 DIAGNOSIS — F419 Anxiety disorder, unspecified: Secondary | ICD-10-CM | POA: Diagnosis not present

## 2019-01-01 DIAGNOSIS — N401 Enlarged prostate with lower urinary tract symptoms: Secondary | ICD-10-CM | POA: Diagnosis not present

## 2019-01-01 NOTE — Telephone Encounter (Signed)
Error

## 2019-01-01 NOTE — Telephone Encounter (Signed)
Miguel Rota, RN with Hosp[facility] called with questions for Dr. Doristine Devoid assistant about Nuplazid.

## 2019-01-01 NOTE — Telephone Encounter (Signed)
Called with no answer and unable to leave a voicemail

## 2019-01-02 NOTE — Telephone Encounter (Signed)
Per Hosp[facility] the papers are sent is it available for [place] to view and check status?

## 2019-01-02 NOTE — Telephone Encounter (Signed)
Left message to call office back

## 2019-01-02 NOTE — Telephone Encounter (Signed)
Christopher Bridges [email or web address]

## 2019-01-02 NOTE — Telephone Encounter (Signed)
Sarah aware

## 2019-01-02 NOTE — Telephone Encounter (Signed)
No idea what they are talking about.  They wanted to fill out the papers so they/their physician will need to check on it.

## 2019-01-02 NOTE — Telephone Encounter (Signed)
Can I give them the phone number to the representative with the company you talk with?

## 2019-01-10 ENCOUNTER — Other Ambulatory Visit: Payer: Self-pay | Admitting: Neurology

## 2019-01-10 NOTE — Telephone Encounter (Signed)
Patient's wife called to see if we have received the Nuplazid 34 MG samples yet. She said the pharmaceutical rep told the hospice nurse that samples would be sent to the office for her husband.

## 2019-01-10 NOTE — Telephone Encounter (Signed)
Caled wife, samples upfront

## 2019-02-02 DEATH — deceased

## 2019-03-22 ENCOUNTER — Ambulatory Visit: Payer: Medicare Other | Admitting: Neurology

## 2020-05-15 IMAGING — CT CT ANGIO HEAD
3 of 7 series · 10 of 36 positions shown · IV contrast (omnipaque)
Comparison: CTA of the head and neck 06/14/2018

CLINICAL DATA: Focal neuro deficit for less than 6 hours, stroke
suspected. Last seen normal 2 hours ago. Left facial droop. Aphasia.
Left-sided weakness.

EXAM:
CT ANGIOGRAPHY HEAD AND NECK
TECHNIQUE: Multidetector CT imaging of the head and neck was performed using
the standard protocol during bolus administration of intravenous
contrast. Multiplanar CT image reconstructions and MIPs were
obtained to evaluate the vascular anatomy. Carotid stenosis
measurements (when applicable) are obtained utilizing NASCET
criteria, using the distal internal carotid diameter as the
denominator.
CONTRAST:  100mL OMNIPAQUE IOHEXOL 350 MG/ML SOLN

[Series 5: cta neck · axial · 0.45mm/px · z∈[-238,-116]mm · 2 of 183 slices shown]
[im 61/183  soft-tissue]
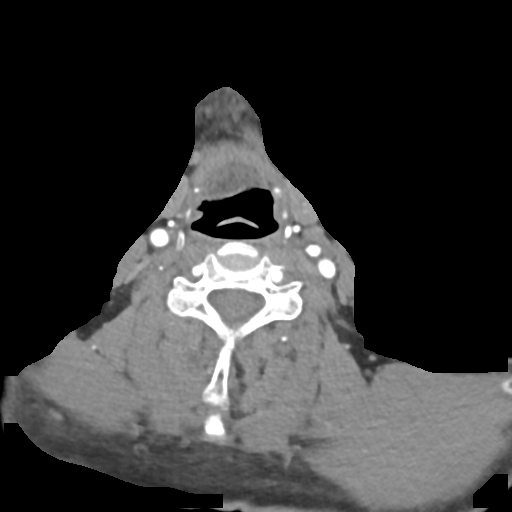
[im 122/183  soft-tissue]
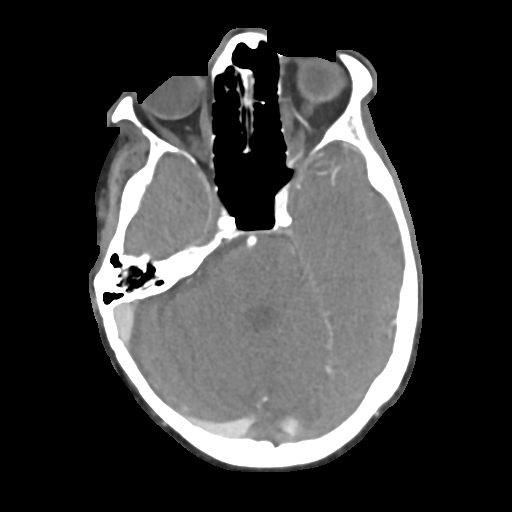

[Series 7: cta neck axial · axial · 0.39mm/px · z∈[-289,-32]mm · 6 of 365 slices shown]
[im 53/365  soft-tissue]
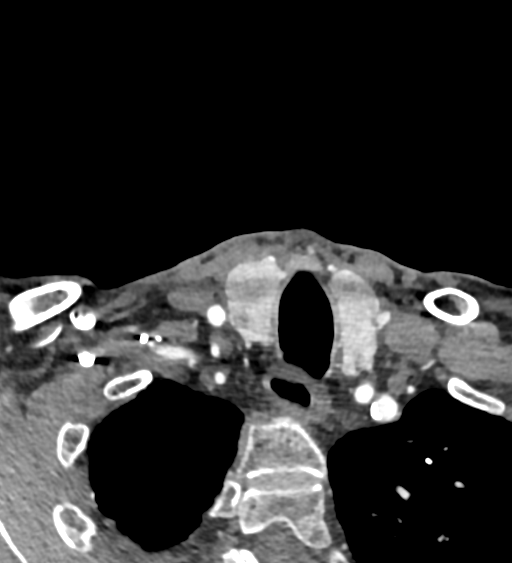
[im 105/365  bone]
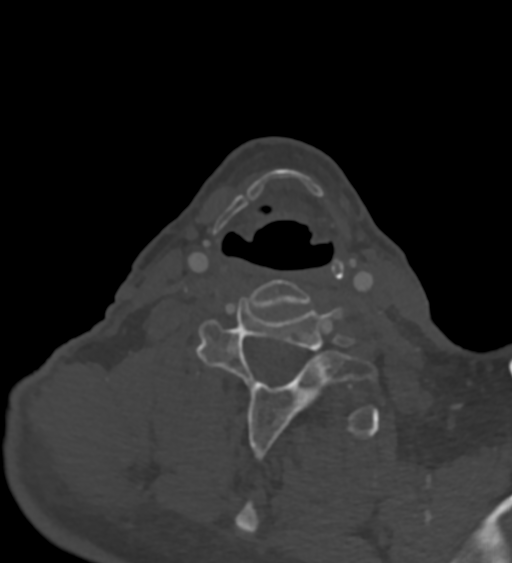
[im 157/365  soft-tissue]
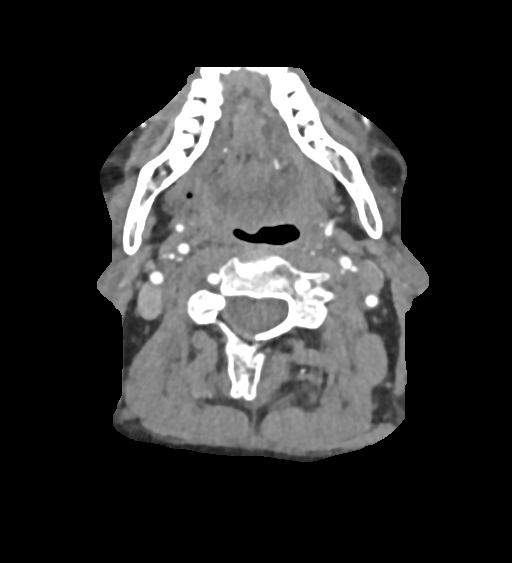
[im 209/365  bone]
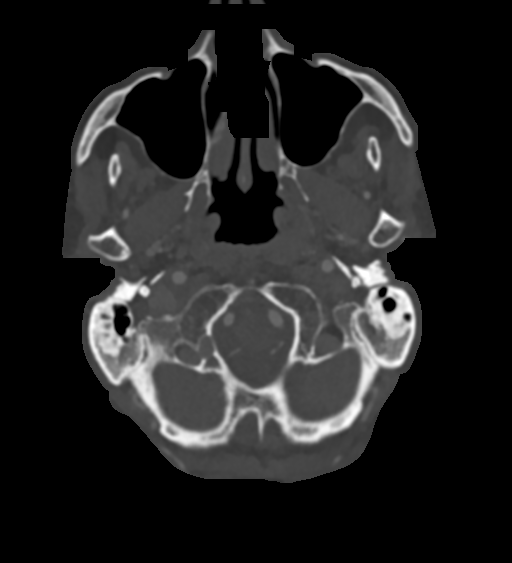
[im 261/365  soft-tissue]
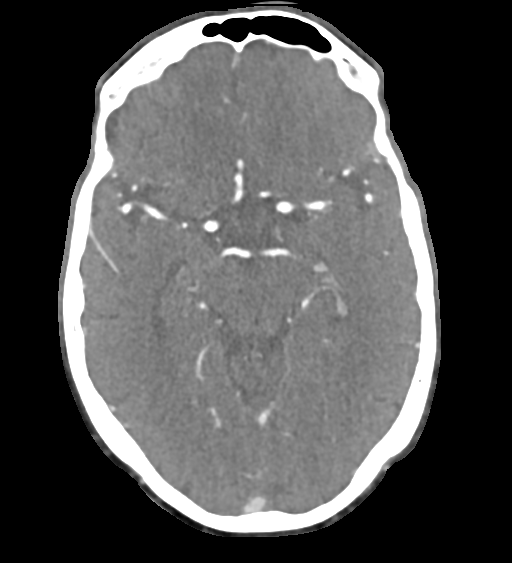
[im 313/365  bone]
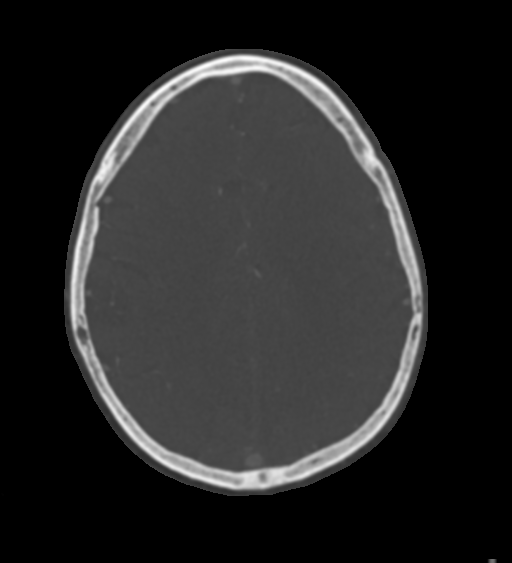

[Series 9: cta neck sagittal · sagittal · 0.48mm/px · 2 of 201 slices shown]
[im 38/201  soft-tissue]
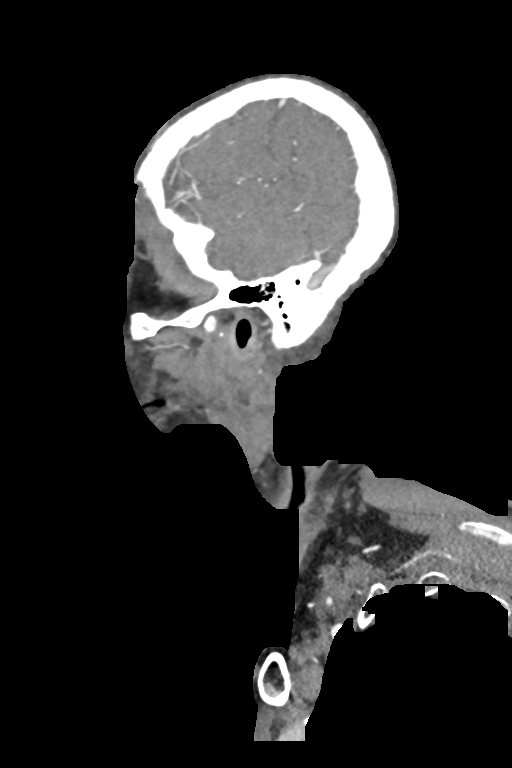
[im 164/201  soft-tissue]
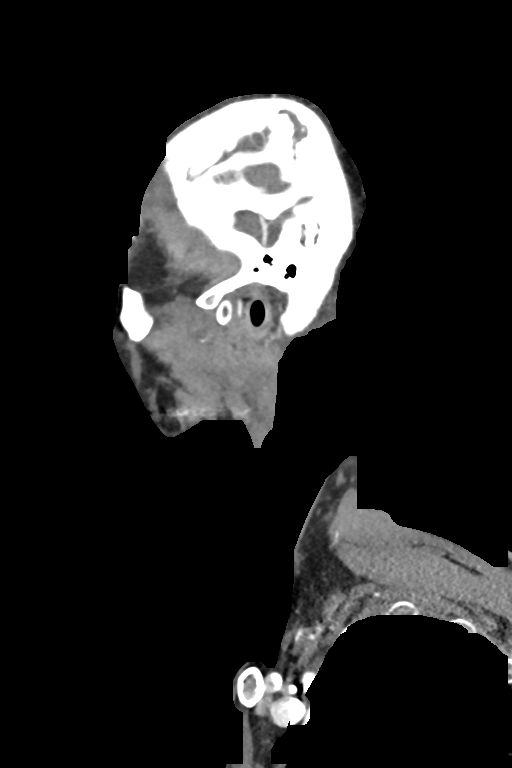

[10 of 36 positions shown; findings below may reference images not displayed]

FINDINGS: CTA NECK FINDINGS

Aortic arch: A 3 vessel arch configuration is present.
Atherosclerotic changes are noted at the arch without significant
stenosis or aneurysm.

Right carotid system: The right common carotid artery is within
normal limits. Minimal atherosclerotic changes noted at the right
carotid bifurcation. The cervical right ICA is normal.

Left carotid system: The left common carotid artery is within normal
limits. Mild atherosclerotic changes are noted at the bifurcation.
There is no significant stenosis. Cervical left ICA is normal.

Vertebral arteries: The left vertebral artery is slightly dominant
to the right. Both vertebral arteries originate from the subclavian
arteries without significant stenosis. There is some calcification
at the origin of the left vertebral artery. There is no significant
stenosis of either vertebral artery in the neck.

Skeleton: Hemangioma is again noted at C7. Vertebral body heights
and alignment are maintained. No focal lytic or blastic lesions are
present otherwise.

Other neck: Dominant right thyroid nodule measures 12.5 cm
maximally. Other smaller lesions are present and stable. No
significant adenopathy is present. Salivary glands and ducts are
normal.

Upper chest: The lung apices are clear. Thoracic inlet is within
normal limits.

Review of the MIP images confirms the above findings

CTA HEAD FINDINGS

Anterior circulation: Atherosclerotic calcifications are present
within the cavernous internal carotid arteries bilaterally without a
significant stenosis through the ICA termini. There is no
significant interval change. The ICA termini are within normal
limits. There is moderate to severe stenosis of the more distal left
A2 segment.

Posterior circulation: M1 segments are within normal limits. MCA
bifurcations are intact. Distal segmental small vessel disease is
present without other significant proximal stenosis or branch vessel
occlusion.

Venous sinuses: The left vertebral artery is dominant.
Atherosclerotic changes are noted at the dural margin without
significant stenosis. Vertebrobasilar junction is normal. The
basilar artery is normal. Both posterior cerebral arteries
originates from the basilar tip. There is some narrowing of distal
PCA branch vessels without a significant proximal stenosis or
occlusion.

Anatomic variants: None

Review of the MIP images confirms the above findings
IMPRESSION: 1. No significant proximal stenosis, aneurysm, or branch vessel
occlusion within the Circle of Willis.
2. Mild atherosclerotic changes at the carotid bifurcations
bilaterally without significant stenosis.
3. Atherosclerotic changes within the cavernous internal carotid
arteries bilaterally without significant stenosis.
4. Moderate to severe stenosis of the more distal left A2 segment.
5. Mild diffuse medium and small vessel disease compatible with
intracranial atherosclerosis.
6. Dominant right thyroid nodule measures 12.5 cm maximally. This
does not meet criteria for further imaging. Recommend no additional
imaging.
